# Patient Record
Sex: Female | Born: 1955 | Race: Black or African American | Hispanic: No | Smoking: Former smoker
Health system: Southern US, Community
[De-identification: ages and names within clinical notes are randomized; demographics above are authoritative.]

## PROBLEM LIST (undated history)

## (undated) DIAGNOSIS — R569 Unspecified convulsions: Secondary | ICD-10-CM

## (undated) DIAGNOSIS — R7989 Other specified abnormal findings of blood chemistry: Secondary | ICD-10-CM

## (undated) DIAGNOSIS — E119 Type 2 diabetes mellitus without complications: Secondary | ICD-10-CM

## (undated) DIAGNOSIS — I480 Paroxysmal atrial fibrillation: Secondary | ICD-10-CM

## (undated) DIAGNOSIS — I509 Heart failure, unspecified: Secondary | ICD-10-CM

## (undated) DIAGNOSIS — E039 Hypothyroidism, unspecified: Secondary | ICD-10-CM

## (undated) DIAGNOSIS — I10 Essential (primary) hypertension: Secondary | ICD-10-CM

## (undated) HISTORY — PX: PACEMAKER IMPLANT: EP1218

---

## 2020-02-03 ENCOUNTER — Encounter (HOSPITAL_COMMUNITY): Payer: Self-pay

## 2020-02-03 ENCOUNTER — Inpatient Hospital Stay (HOSPITAL_COMMUNITY)
Admission: EM | Admit: 2020-02-03 | Discharge: 2020-02-07 | DRG: 101 | Disposition: A | Discharge status: Home Health | Payer: Medicare Other | Attending: Internal Medicine | Admitting: Internal Medicine

## 2020-02-03 ENCOUNTER — Emergency Department (HOSPITAL_COMMUNITY): Payer: Medicare Other

## 2020-02-03 DIAGNOSIS — Z8249 Family history of ischemic heart disease and other diseases of the circulatory system: Secondary | ICD-10-CM

## 2020-02-03 DIAGNOSIS — I5022 Chronic systolic (congestive) heart failure: Secondary | ICD-10-CM | POA: Diagnosis present

## 2020-02-03 DIAGNOSIS — I679 Cerebrovascular disease, unspecified: Secondary | ICD-10-CM | POA: Diagnosis present

## 2020-02-03 DIAGNOSIS — Z83438 Family history of other disorder of lipoprotein metabolism and other lipidemia: Secondary | ICD-10-CM

## 2020-02-03 DIAGNOSIS — E785 Hyperlipidemia, unspecified: Secondary | ICD-10-CM | POA: Diagnosis present

## 2020-02-03 DIAGNOSIS — I10 Essential (primary) hypertension: Secondary | ICD-10-CM | POA: Diagnosis present

## 2020-02-03 DIAGNOSIS — Z794 Long term (current) use of insulin: Secondary | ICD-10-CM | POA: Diagnosis not present

## 2020-02-03 DIAGNOSIS — I252 Old myocardial infarction: Secondary | ICD-10-CM

## 2020-02-03 DIAGNOSIS — I480 Paroxysmal atrial fibrillation: Secondary | ICD-10-CM | POA: Diagnosis present

## 2020-02-03 DIAGNOSIS — Z7901 Long term (current) use of anticoagulants: Secondary | ICD-10-CM

## 2020-02-03 DIAGNOSIS — I493 Ventricular premature depolarization: Secondary | ICD-10-CM | POA: Diagnosis present

## 2020-02-03 DIAGNOSIS — Z20822 Contact with and (suspected) exposure to covid-19: Secondary | ICD-10-CM | POA: Diagnosis present

## 2020-02-03 DIAGNOSIS — E039 Hypothyroidism, unspecified: Secondary | ICD-10-CM | POA: Diagnosis not present

## 2020-02-03 DIAGNOSIS — E119 Type 2 diabetes mellitus without complications: Secondary | ICD-10-CM | POA: Diagnosis not present

## 2020-02-03 DIAGNOSIS — Z95810 Presence of automatic (implantable) cardiac defibrillator: Secondary | ICD-10-CM | POA: Diagnosis not present

## 2020-02-03 DIAGNOSIS — Z823 Family history of stroke: Secondary | ICD-10-CM

## 2020-02-03 DIAGNOSIS — E038 Other specified hypothyroidism: Secondary | ICD-10-CM | POA: Diagnosis not present

## 2020-02-03 DIAGNOSIS — I4891 Unspecified atrial fibrillation: Secondary | ICD-10-CM | POA: Diagnosis present

## 2020-02-03 DIAGNOSIS — Z7984 Long term (current) use of oral hypoglycemic drugs: Secondary | ICD-10-CM

## 2020-02-03 DIAGNOSIS — Z8673 Personal history of transient ischemic attack (TIA), and cerebral infarction without residual deficits: Secondary | ICD-10-CM | POA: Diagnosis not present

## 2020-02-03 DIAGNOSIS — I110 Hypertensive heart disease with heart failure: Secondary | ICD-10-CM | POA: Diagnosis present

## 2020-02-03 DIAGNOSIS — J45909 Unspecified asthma, uncomplicated: Secondary | ICD-10-CM | POA: Diagnosis present

## 2020-02-03 DIAGNOSIS — Z87891 Personal history of nicotine dependence: Secondary | ICD-10-CM

## 2020-02-03 DIAGNOSIS — I2510 Atherosclerotic heart disease of native coronary artery without angina pectoris: Secondary | ICD-10-CM | POA: Diagnosis present

## 2020-02-03 DIAGNOSIS — K219 Gastro-esophageal reflux disease without esophagitis: Secondary | ICD-10-CM | POA: Diagnosis present

## 2020-02-03 DIAGNOSIS — Z833 Family history of diabetes mellitus: Secondary | ICD-10-CM | POA: Diagnosis not present

## 2020-02-03 DIAGNOSIS — R569 Unspecified convulsions: Secondary | ICD-10-CM | POA: Diagnosis present

## 2020-02-03 DIAGNOSIS — R791 Abnormal coagulation profile: Secondary | ICD-10-CM | POA: Diagnosis present

## 2020-02-03 DIAGNOSIS — Z955 Presence of coronary angioplasty implant and graft: Secondary | ICD-10-CM | POA: Diagnosis not present

## 2020-02-03 DIAGNOSIS — Z9114 Patient's other noncompliance with medication regimen: Secondary | ICD-10-CM

## 2020-02-03 DIAGNOSIS — I5021 Acute systolic (congestive) heart failure: Secondary | ICD-10-CM | POA: Diagnosis not present

## 2020-02-03 DIAGNOSIS — E1165 Type 2 diabetes mellitus with hyperglycemia: Secondary | ICD-10-CM | POA: Diagnosis present

## 2020-02-03 LAB — URINALYSIS, ROUTINE W REFLEX MICROSCOPIC
Bilirubin Urine: NEGATIVE
Glucose, UA: NEGATIVE mg/dL
Hgb urine dipstick: NEGATIVE
Ketones, ur: NEGATIVE mg/dL
Leukocytes,Ua: NEGATIVE
Nitrite: NEGATIVE
Protein, ur: 30 mg/dL — AB
Specific Gravity, Urine: 1.018 (ref 1.005–1.030)
pH: 5 (ref 5.0–8.0)

## 2020-02-03 LAB — MAGNESIUM: Magnesium: 1.7 mg/dL (ref 1.7–2.4)

## 2020-02-03 LAB — PROTIME-INR
INR: 1.2 (ref 0.8–1.2)
Prothrombin Time: 15 seconds (ref 11.4–15.2)

## 2020-02-03 LAB — TROPONIN I (HIGH SENSITIVITY)
Troponin I (High Sensitivity): 4 ng/L (ref <18)
Troponin I (High Sensitivity): 4 ng/L (ref <18)

## 2020-02-03 LAB — BASIC METABOLIC PANEL
Anion gap: 11 (ref 5–15)
BUN: 24 mg/dL — ABNORMAL HIGH (ref 8–23)
CO2: 22 mmol/L (ref 22–32)
Calcium: 10 mg/dL (ref 8.9–10.3)
Chloride: 108 mmol/L (ref 98–111)
Creatinine, Ser: 0.99 mg/dL (ref 0.44–1.00)
GFR calc Af Amer: >60 mL/min (ref >60)
GFR calc non Af Amer: >60 mL/min (ref >60)
Glucose, Bld: 178 mg/dL — ABNORMAL HIGH (ref 70–99)
Potassium: 3.9 mmol/L (ref 3.5–5.1)
Sodium: 141 mmol/L (ref 135–145)

## 2020-02-03 LAB — CBG MONITORING, ED: Glucose-Capillary: 118 mg/dL — ABNORMAL HIGH (ref 70–99)

## 2020-02-03 LAB — CBC
HCT: 42.4 % (ref 36.0–46.0)
Hemoglobin: 13.1 g/dL (ref 12.0–15.0)
MCH: 28.9 pg (ref 26.0–34.0)
MCHC: 30.9 g/dL (ref 30.0–36.0)
MCV: 93.6 fL (ref 80.0–100.0)
Platelets: 299 10*3/uL (ref 150–400)
RBC: 4.53 MIL/uL (ref 3.87–5.11)
RDW: 13.6 % (ref 11.5–15.5)
WBC: 15.7 10*3/uL — ABNORMAL HIGH (ref 4.0–10.5)
nRBC: 0 % (ref 0.0–0.2)

## 2020-02-03 LAB — SARS CORONAVIRUS 2 BY RT PCR (HOSPITAL ORDER, PERFORMED IN ~~LOC~~ HOSPITAL LAB): SARS Coronavirus 2: NEGATIVE

## 2020-02-03 LAB — LACTIC ACID, PLASMA: Lactic Acid, Venous: 2.7 mmol/L (ref 0.5–1.9)

## 2020-02-03 LAB — TSH: TSH: 12.202 u[IU]/mL — ABNORMAL HIGH (ref 0.350–4.500)

## 2020-02-03 MED ORDER — GLIMEPIRIDE 4 MG PO TABS: 4.0000 mg | ORAL_TABLET | Freq: Every morning | ORAL | Status: DC

## 2020-02-03 MED ORDER — ACETAMINOPHEN 650 MG RE SUPP: 650.0000 mg | Freq: Four times a day (QID) | RECTAL | Status: DC | PRN

## 2020-02-03 MED ORDER — LEVOTHYROXINE SODIUM 25 MCG PO TABS
125.0000 ug | ORAL_TABLET | Freq: Every day | ORAL | Status: DC
  Administered 2020-02-04: 125 ug via ORAL
  Filled 2020-02-03: qty 1

## 2020-02-03 MED ORDER — MAGNESIUM HYDROXIDE 400 MG/5ML PO SUSP
30.0000 mL | Freq: Every day | ORAL | Status: DC | PRN
  Filled 2020-02-03: qty 30

## 2020-02-03 MED ORDER — LEVETIRACETAM IN NACL 1000 MG/100ML IV SOLN
1000.0000 mg | Freq: Once | INTRAVENOUS | Status: AC
  Administered 2020-02-03: 1000 mg via INTRAVENOUS
  Filled 2020-02-03: qty 100

## 2020-02-03 MED ORDER — TRAZODONE HCL 50 MG PO TABS
25.0000 mg | ORAL_TABLET | Freq: Every evening | ORAL | Status: DC | PRN
  Administered 2020-02-05: 25 mg via ORAL
  Filled 2020-02-03 (×2): qty 1

## 2020-02-03 MED ORDER — SODIUM CHLORIDE 0.9 % IV SOLN: INTRAVENOUS | Status: DC

## 2020-02-03 MED ORDER — WARFARIN SODIUM 7.5 MG PO TABS
7.5000 mg | ORAL_TABLET | Freq: Once | ORAL | Status: AC
  Administered 2020-02-03: 7.5 mg via ORAL
  Filled 2020-02-03: qty 1

## 2020-02-03 MED ORDER — ACETAMINOPHEN 325 MG PO TABS
650.0000 mg | ORAL_TABLET | Freq: Four times a day (QID) | ORAL | Status: DC | PRN
  Administered 2020-02-04: 650 mg via ORAL
  Filled 2020-02-03: qty 2

## 2020-02-03 MED ORDER — HYDRALAZINE HCL 50 MG PO TABS
100.0000 mg | ORAL_TABLET | Freq: Three times a day (TID) | ORAL | Status: DC
  Administered 2020-02-03 – 2020-02-07 (×10): 100 mg via ORAL
  Filled 2020-02-03 (×11): qty 2

## 2020-02-03 MED ORDER — SODIUM CHLORIDE 0.9 % IV BOLUS
1000.0000 mL | Freq: Once | INTRAVENOUS | Status: AC
  Administered 2020-02-03: 1000 mL via INTRAVENOUS

## 2020-02-03 MED ORDER — WARFARIN SODIUM 6 MG PO TABS: 6.0000 mg | ORAL_TABLET | Freq: Every evening | ORAL | Status: DC

## 2020-02-03 MED ORDER — INSULIN GLARGINE 100 UNIT/ML SOLOSTAR PEN: 30.0000 [IU] | PEN_INJECTOR | Freq: Two times a day (BID) | SUBCUTANEOUS | Status: DC

## 2020-02-03 MED ORDER — LORAZEPAM 2 MG/ML IJ SOLN: 1.0000 mg | INTRAMUSCULAR | Status: DC | PRN

## 2020-02-03 MED ORDER — FUROSEMIDE 20 MG PO TABS: 40.0000 mg | ORAL_TABLET | Freq: Every day | ORAL | Status: DC

## 2020-02-03 MED ORDER — MIRABEGRON ER 25 MG PO TB24
25.0000 mg | ORAL_TABLET | Freq: Every day | ORAL | Status: DC
  Administered 2020-02-04 – 2020-02-07 (×4): 25 mg via ORAL
  Filled 2020-02-03 (×4): qty 1

## 2020-02-03 MED ORDER — VITAMIN D (ERGOCALCIFEROL) 1.25 MG (50000 UNIT) PO CAPS: 50000.0000 [IU] | ORAL_CAPSULE | ORAL | Status: DC

## 2020-02-03 MED ORDER — RAMIPRIL 5 MG PO CAPS
5.0000 mg | ORAL_CAPSULE | Freq: Every morning | ORAL | Status: DC
  Administered 2020-02-04 – 2020-02-07 (×4): 5 mg via ORAL
  Filled 2020-02-03 (×5): qty 1

## 2020-02-03 MED ORDER — WARFARIN - PHARMACIST DOSING INPATIENT: Freq: Every day | Status: DC

## 2020-02-03 MED ORDER — INSULIN ASPART 100 UNIT/ML ~~LOC~~ SOLN
0.0000 [IU] | Freq: Three times a day (TID) | SUBCUTANEOUS | Status: DC
  Administered 2020-02-04 (×2): 1 [IU] via SUBCUTANEOUS
  Administered 2020-02-04: 2 [IU] via SUBCUTANEOUS
  Administered 2020-02-04: 3 [IU] via SUBCUTANEOUS
  Administered 2020-02-05: 1 [IU] via SUBCUTANEOUS
  Administered 2020-02-05: 2 [IU] via SUBCUTANEOUS

## 2020-02-03 MED ORDER — ONDANSETRON HCL 4 MG/2ML IJ SOLN: 4.0000 mg | Freq: Four times a day (QID) | INTRAMUSCULAR | Status: DC | PRN

## 2020-02-03 MED ORDER — LEVETIRACETAM ER 500 MG PO TB24
500.0000 mg | ORAL_TABLET | Freq: Two times a day (BID) | ORAL | Status: DC
  Administered 2020-02-04 – 2020-02-07 (×7): 500 mg via ORAL
  Filled 2020-02-03 (×8): qty 1

## 2020-02-03 MED ORDER — ENOXAPARIN SODIUM 40 MG/0.4ML ~~LOC~~ SOLN
40.0000 mg | SUBCUTANEOUS | Status: DC
  Administered 2020-02-03 – 2020-02-06 (×4): 40 mg via SUBCUTANEOUS
  Filled 2020-02-03 (×4): qty 0.4

## 2020-02-03 MED ORDER — ONDANSETRON HCL 4 MG PO TABS: 4.0000 mg | ORAL_TABLET | Freq: Four times a day (QID) | ORAL | Status: DC | PRN

## 2020-02-03 MED ORDER — VITAMIN D3 1.25 MG (50000 UT) PO CAPS: 50000.0000 [IU] | ORAL_CAPSULE | ORAL | Status: DC

## 2020-02-03 NOTE — Progress Notes (Signed)
ANTICOAGULATION CONSULT NOTE - Initial Consult  Pharmacy Consult for warfarin Indication: atrial fibrillation  No Known Allergies  Patient Measurements: Height: 5' (152.4 cm) Weight: 72.6 kg (160 lb) IBW/kg (Calculated) : 45.5   Vital Signs: Temp: 98.3 F (36.8 C) (07/24 1350) Temp Source: Oral (07/24 1350) BP: 157/89 (07/24 2245) Pulse Rate: 59 (07/24 2245)  Labs: Recent Labs    02/03/20 1400 02/03/20 1403 02/03/20 2014  HGB  --  13.1  --   HCT  --  42.4  --   PLT  --  299  --   LABPROT  --   --  15.0  INR  --   --  1.2  CREATININE 0.99  --   --   TROPONINIHS  --   --  4    Estimated Creatinine Clearance: 51 mL/min (by C-G formula based on SCr of 0.99 mg/dL).   Assessment: 64 yo W on warfarin PTA for afib (CHADS2VASc = 8) with recent loss of family member and noncompliance with medications. INR 1.2 on admission but patient reported taking home dose of 6mg  on 7/23. Previous INR in June was high at 3.4.  PTA Warfarin dose = appears to be 6mg  daily but needs confirmation with patient's daughter   Goal of Therapy:  INR 2-3 Monitor platelets by anticoagulation protocol: Yes   Plan:  Warfarin 7.5mg  x1  Monitor daily INR, CBC/plt Monitor for signs/symptoms of bleeding   July, PharmD, BCPS, BCCP Clinical Pharmacist  Please check AMION for all [facility] phone numbers After 10:00 PM, call Main Pharmacy [phone]

## 2020-02-03 NOTE — ED Notes (Signed)
2 RN attempted IV start without success.

## 2020-02-03 NOTE — H&P (Signed)
[place] at [facility]   PATIENT NAME: Nichole [name]    MR#:  [id number]  DATE OF BIRTH:  May 05, 1956  DATE OF ADMISSION:  02/03/2020  PRIMARY CARE PHYSICIAN: Patient, No Pcp Per   REQUESTING/REFERRING PHYSICIAN: Jacalyn Lefevre, MD  CHIEF COMPLAINT:   Chief Complaint  Patient presents with  . Seizures    HISTORY OF PRESENT ILLNESS:  Nichole [name]  is a 64 y.o. African-American female with a known history of asthma, atrial fibrillation chronic systolic CHF, coronary artery disease, hypertension, dyslipidemia and CVA, presented to the emergency room with the onset of suspected seizure.  The patient was noted by her daughter to have shaking, Eglitis with unresponsiveness.  When she recovered however she was alert and oriented.  The patient denied any urinary or stool incontinence or tongue bites.  The patient was recently admitted to [facility] for sepsis and UTI was placed on mechanical intubation.  She was discharged to skilled nursing facility and then went home about 3 weeks ago.  Her husband died on 01/16/23.  She apparently has been missing her medications till 713 when she was staying alone and after that she moved with her daughter.  Her daughter stated that she has been taking her medications regularly with her.  No reported fever or chills.  No nausea or vomiting or abdominal pain.  No chest pain or palpitations.  No dysuria, oliguria or hematuria or flank pain.  She denies any new paresthesias or focal muscle weakness beyond her residual weakness from previous stroke.  Upon presentation to the emergency room, vital signs were within normal.  Labs revealed BMP with glucose of 178 and BUN of 24.  Magnesium level is 1.7.  High-sensitivity troponin I was 4 lactic acid 2.7.  CBC showed leukocytosis of 15.7.  TSH came back elevated at 12.2.  COVID-19 PCR came back negative.  Scan showed no acute intracranial normalities.  It also showed advanced atrophy and  microvascular ischemic change as well as multiple lacunar infarcts involving both basal ganglia, presumably chronic with old infarct involving the posterior inferior right cerebellum.  Two-view chest ray showed mild cardiomegaly with no acute cardiopulmonary disease.  The patient was given IV Keppra and 2 L bolus of IV normal saline.  She will be admitted to a telemetry medical bed for further evaluation and management. PAST MEDICAL HISTORY:  . Asthma  . Atrial fibrillation (HCC) 03/24/2012  . Chronic systolic heart failure (HCC) 03/24/2012  . Heart attack (HCC)  . Hyperlipidemia  . Hypertension  . Nonsustained ventricular tachycardia (HCC) 03/24/2012  . Stroke (HCC)  . Thyroid disease   PAST SURGICAL HISTORY:  . CARDIAC CATHETERIZATION  . CARDIAC DEVICE IMPLANT  defib  . CHOLECYSTECTOMY  . CORONARY ANGIOPLASTY WITH STENT PLACEMENT  . GALLBLADDER SURGERY .  Marland Kitchen HYSTERECTOMY   SOCIAL HISTORY:  Socioeconomic History  . Marital status: Widowed Spouse name: Onalee Hua  . Number of children: 2  . Years of education: Not on file  . Highest education level: Not on file  Occupational History  . Occupation: Chiropodist at [employer]  Comment: Disabled  Tobacco Use  . Smoking status: Former Smoker  Types: Cigarettes  Quit date: 01/17/2009  Years since quitting: 10.8  . Smokeless tobacco: Never Used  Vaping Use  . Vaping Use: Never assessed  Substance and Sexual Activity  . Alcohol use: No  . Drug use: No    FAMILY HISTORY:  . Diabetes Mother  .  Hypertension Mother  . Stroke Mother  . Stroke Father  . Hypertension Father  . Heart attack Sister  . Heart disease Sister  . Hypertension Sister  . Diabetes Brother  . Heart disease Brother  . Hyperlipidemia Brother  . Hypertension Brother  . Hypertension Sister  . Hypertension Sister  . Hypertension Sister    DRUG ALLERGIES:  No Known Allergies  REVIEW OF SYSTEMS:   ROS As per history of present illness. All pertinent  systems were reviewed above. Constitutional, HEENT, cardiovascular, respiratory, GI, GU, musculoskeletal, neuro, psychiatric, endocrine, integumentary and hematologic systems were reviewed and are otherwise negative/unremarkable except for positive findings mentioned above in the HPI.   MEDICATIONS AT HOME:   Prior to Admission medications   Medication Sig Start Date End Date Taking? Authorizing Provider  Cholecalciferol (VITAMIN D3) 1.25 MG (50000 UT) CAPS Take 50,000 Units by mouth every Friday. 01/10/20  Yes [provider]  hydrALAZINE (APRESOLINE) 100 MG tablet Take 100 mg by mouth 3 (three) times daily. 01/12/20  Yes [provider]  levothyroxine (SYNTHROID) 125 MCG tablet Take 125 mcg by mouth every morning. 09/06/19  Yes [provider]  metFORMIN (GLUCOPHAGE) 1000 MG tablet Take 1,000 mg by mouth 2 (two) times daily. 01/10/20  Yes [provider]  mirabegron ER (MYRBETRIQ) 25 MG TB24 tablet Take 25 mg by mouth daily.   Yes [provider]  warfarin (COUMADIN) 6 MG tablet Take 6 mg by mouth every evening. 01/10/20  Yes [provider]  furosemide (LASIX) 40 MG tablet Take 40 mg by mouth daily. 01/10/20   [provider]  glimepiride (AMARYL) 4 MG tablet Take 4 mg by mouth every morning. 01/10/20   [provider]  insulin glargine (LANTUS SOLOSTAR) 100 UNIT/ML Solostar Pen Inject 30 Units into the skin 2 (two) times daily.    [provider]  ramipril (ALTACE) 5 MG capsule Take 5 mg by mouth every morning. 01/10/20   [provider]      VITAL SIGNS:  Blood pressure (!) 146/89, pulse 63, temperature 98.3 F (36.8 C), temperature source Oral, resp. rate 23, height 5' (1.524 m), weight 72.6 kg, SpO2 95 %.  PHYSICAL EXAMINATION:  Physical Exam  GENERAL:  64 y.o.-year-old African-American female patient lying in the bed with no acute distress.  EYES: Pupils equal, round, reactive to light and  accommodation. No scleral icterus. Extraocular muscles intact.  HEENT: Head atraumatic, normocephalic. Oropharynx and nasopharynx clear.  NECK:  Supple, no jugular venous distention. No thyroid enlargement, no tenderness.  LUNGS: Normal breath sounds bilaterally, no wheezing, rales,rhonchi or crepitation. No use of accessory muscles of respiration.  CARDIOVASCULAR: Regular rate and rhythm, S1, S2 normal. No murmurs, rubs, or gallops.  ABDOMEN: Soft, nondistended, nontender. Bowel sounds present. No organomegaly or mass.  EXTREMITIES: No pedal edema, cyanosis, or clubbing.  NEUROLOGIC: Cranial nerves II through XII are intact. Muscle strength 4-5/5 in left upper and lower extremities compared to 5/5 in the right upper and lower extremities.  Sensation intact. Gait not checked.  PSYCHIATRIC: The patient is alert and oriented x 3.  Normal affect and good eye contact. SKIN: No obvious rash, lesion, or ulcer.   LABORATORY PANEL:   CBC Recent Labs  Lab 02/03/20 1403  WBC 15.7*  HGB 13.1  HCT 42.4  PLT 299   ------------------------------------------------------------------------------------------------------------------  Chemistries  Recent Labs  Lab 02/03/20 1400 02/03/20 2014  NA 141  --   K 3.9  --   CL 108  --  CO2 22  --   GLUCOSE 178*  --   BUN 24*  --   CREATININE 0.99  --   CALCIUM 10.0  --   MG  --  1.7   ------------------------------------------------------------------------------------------------------------------  Cardiac Enzymes No results for input(s): TROPONINI in the last 168 hours. ------------------------------------------------------------------------------------------------------------------  RADIOLOGY:  DG Chest 2 View  Result Date: 02/03/2020 CLINICAL DATA:  Seizures EXAM: CHEST - 2 VIEW COMPARISON:  None. FINDINGS: The lungs are clear. Opacification of the left lung bases related to overlying soft tissue. No pneumothorax or pleural effusion. Cardiac  size is mildly enlarged. Left subclavian 3 lead pacemaker defibrillator is in place. Pulmonary vascularity is normal. No acute bone abnormality. IMPRESSION: No active cardiopulmonary disease.  Mild cardiomegaly. Electronically Signed   By: Helyn NumbersAshesh  Parikh MD   On: 02/03/2020 19:43   CT HEAD WO CONTRAST  Result Date: 02/03/2020 CLINICAL DATA:  Seizure. EXAM: CT HEAD WITHOUT CONTRAST TECHNIQUE: Contiguous axial images were obtained from the base of the skull through the vertex without intravenous contrast. COMPARISON:  None. FINDINGS: Brain: No evidence of acute infarction, hemorrhage, hydrocephalus, extra-axial collection or mass lesion/mass effect. Advanced atrophy and microvascular ischemic changes are noted. Multiple lacunar infarcts are noted involving the bilateral basal 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 posteroinferior right cerebellum (sagittal series 6, image 25). Vascular: No hyperdense vessel or unexpected calcification. Skull: Normal. Negative for fracture or focal lesion. Sinuses/Orbits: No acute finding. Other: None. IMPRESSION: 1. No acute intracranial abnormality. 2. Advanced atrophy and microvascular ischemic changes. 3. Multiple lacunar infarcts are noted involving the bilateral basal 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 posteroinferior right cerebellum. Electronically Signed   By: Katherine Mantlehristopher  Green M.D.   On: 02/03/2020 19:37      IMPRESSION AND PLAN:   1.  Seizures of new onset. -The patient will be admitted to a telemetry medical bed. -We will follow neuro checks every 4 hours for 24 hours. -She will be on seizures precautions. -We will place on as needed IV Ativan. -She was given IV Keppra and will be continued on it. -Neurology consultation will be obtained. -Dr. Amada JupiterKirkpatrick was notified about patient.  2.  Type 2 diabetes mellitus. -We will place her for now on supplement coverage with NovoLog. -We will hold  off Glucophage and Amaryl as well as Lantus and monitor her blood glucose while she is here while checking hemoglobin A1c.  3.  Hypothyroidism with elevated TSH. -This could be secondary to noncompliance with medications due to her husband's recent death. -We will check free T3 and free T4 and continue Synthroid.  4.  Hypertension. -Continue Coreg and Altace.  5.  Dyslipidemia. -Continue statin therapy.  6.  Paroxysmal atrial fibrillation on chronic anticoagulation with Coumadin. -We will continue Coreg and per pharmacy.  7.  GERD. -We will continue PPI therapy.  8.  DVT prophylaxis. -Subcutaneous Lovenox   All the records are reviewed and case discussed with ED provider. The plan of care was discussed in details with the patient (and family). I answered all questions. The patient agreed to proceed with the above mentioned plan. Further management will depend upon hospital course.   CODE STATUS: Full code  Status is: Inpatient  Remains inpatient appropriate because:Ongoing diagnostic testing needed not appropriate for outpatient work up, Unsafe d/c plan, IV treatments appropriate due to intensity of illness or inability to take PO and Inpatient level of care appropriate due to severity of illness The patient will  be further assessed for her seizures and monitored.  She has not been taking medications for several days and her TSH is elevated.  Her blood glucose measures will be monitored while she is here.  She is subtherapeutic on her INR as she is likely not been taking Coumadin which will be resumed and will follow her INR here.  Given all this problems she would likely need more than 2 midnights of hospital stay   Dispo: The patient is from: Home              Anticipated d/c is to: Home              Anticipated d/c date is: 2 days              Patient currently is not medically stable to d/c.   TOTAL TIME TAKING CARE OF THIS PATIENT: 55 minutes.    Hannah Beat M.D on  02/03/2020 at 10:33 PM  [facility]   From 7 PM-7 AM, contact night-coverage [email or web address]  CC: Primary care physician; Patient, No Pcp Per   Note: This dictation was prepared with Dragon dictation along with smaller phrase technology. Any transcriptional typo errors that result from this process are unintentional.

## 2020-02-03 NOTE — ED Notes (Signed)
RN attempted IV start with blood draw twice, unsuccessfully. Second RN to attempt IV start, RN requested lab to draw blood work.

## 2020-02-03 NOTE — ED Notes (Signed)
Dr. Particia Nearing notified of critical lactic acid result.

## 2020-02-03 NOTE — ED Notes (Signed)
Pt in CT.

## 2020-02-03 NOTE — ED Provider Notes (Signed)
[facility] EMERGENCY DEPARTMENT Provider Note   CSN: [id number] Arrival date & time: 02/03/20  1347     History Chief Complaint  Patient presents with  . Seizures    Nichole [name] is a 64 y.o. female.  Pt presents to the ED today with a possible seizure.  Pt was sitting down and eating and had what looked to be a seizure.  Pt's daughter witnessed this.  Pt was incontinent of urine.  The daughter checked her bs and it was nl.  Pt has never had a seizure before.  Pt has just moved to [place] about a week ago.  Her husband was her primary caregiver.  He passed away about 2 weeks ago.  Pt's daughter said she's been giving pt all the meds she was supposed to be taking.  Pt's daughter said she seemed to be doing ok until today.  Pt had an ICU admission from 5/23-29 at [facility]  She was transferred there from [facility].  She had septic shock due to UTI and ARF.  She required intubation and 1 dialysis session.  She grew out e.coli in her urine and was treated with rocephin.  Pt has afib and is on coumadin.  She has a Water quality scientist.  She was sent to a snf after d/c and then home after that.  Pt has not been vaccinated against Covid.  Most recent labs:  Pertinent Diagnostic Studies: CBC Recent Labs  Lab 12/09/19 0542 12/08/19 0812 12/07/19 0509 12/06/19 0457 12/05/19 0355 12/04/19 0444 12/03/19 1330 12/03/19 1132  WBC 12.1* 10.8* 10.4* -- 14.3* 13.7* 14.2* 16.2*  HGB 10.6* 11.2 10.9* -- 12.3 11.2 12.6 11.8  HCT 34.1 34.8 34.1 -- 36.6 32.2* 38.3 37.8  PLT 204 193 168 159* 214 240 241 246  MCV 93.4 90.2 91.2 -- 85.7 83.4 89.1 93.3   BMP Recent Labs  Lab 12/09/19 0542 12/08/19 1953 12/08/19 0812 12/07/19 1730 12/07/19 0509 12/06/19 2023 12/06/19 0818 12/06/19 0515 12/06/19 0458 12/05/19 1733 12/03/19 1015  NA 137 139 -- 139 139 137 -- -- 141 144 132*  K 3.1* 3.5 -- 3.3* 3.5 3.7 -- -- 4.0 3.2* 5.9*  CL 103 104 -- 104 104 102  -- -- 106 105 104  CO2 25.2 26.5 -- 27.3 25.5 24.7 -- -- 23.8 30.3 --  BUN 14 15 -- _0 -- -- 20 24* --  CREATININE 1.16* 1.32* -- 1.56* 1.75* 1.86* -- -- 2.46* 3.20* 12.24*  GLUC 197* 186* -- 217* 282* 266* -- -- 149* 123* --  AGAP 9 9 -- _1 -- -- 11 9 --  GFR 57 49 -- 40 35 33 -- -- 24 18 <15  CALCIUM 9.1 9.1 -- 9.2 8.9 9.3 -- -- 8.4* 8.6* --  CALCIUMIONIZ -- -- -- -- -- -- 1.15 1.06* -- 1.02* 0.95*  MG -- 1.5* 1.6 1.9 2.4 2.9* -- -- 1.3* 1.6 --  PHOS -- 2.9 2.0* 1.9* 2.6 2.6 -- -- 2.6 2.4 --   LFT Recent Labs  Lab 12/04/19 0444 12/03/19 1132  TP 5.8 6.5  ALB 2.7* 3.0*  AST 22 26  ALT <7* 9*  ALK 53 54  TBIL 0.5 0.5   Coagulation  Recent Labs  Lab 12/09/19 0542 12/08/19 0812 12/07/19 1314 12/07/19 0511 12/06/19 0457 12/05/19 2341 12/05/19 1548 12/05/19 0852 12/04/19 2241 12/04/19 1554 12/03/19 1330 12/03/19 1213  PT 10.6 10.5 10.9 -- -- -- -- -- -- -- 16.1* 16.8*  INR 1.02 1.01 1.05 -- -- -- -- -- -- -- 1.59* 1.66*  PTT -- -- -- 53.9* 48.8* 49.8* 39.7* 46.4* 41.5* 48.7* 30.3 32.3   Urine Studies Recent Labs  Lab 12/04/19 1425  COLORU Straw  UBILI Negative  UBLD Moderate*  UCLA Clear  UGLU >=500*  UKET 20 *  ULEU Trace*  UNIT Negative  UPH 8.0  UPRT Negative  UROB <2.0  USPGR 1.003   Endocrine panel Recent Labs  Lab 12/04/19 0444 12/03/19 1132  TSHHIGHSENSI -- 3.871  HA1C >14.0* --   Lipid Panel Recent Labs  Lab 12/05/19 0355  CHOL 137  TRIG 121  HDL 53.0  LDL 59.8   ABG Recent Labs  Lab 12/05/19 1048 12/05/19 0615 12/05/19 0402 12/04/19 1534 12/04/19 0814 12/04/19 0609 12/04/19 0356  PHART 7.45 7.53* 7.48* 7.53* 7.53* 7.67* 7.54*  PO2ART 97 101 102 94 70* 83 93  PCO2ART 37.1 36.2 39.8 32.4* 25.3* 14.5* 19.8*  HCO3ART 26.0 30.0* 29.5* 26.9 21.1 16.6* 16.9*   12/09/2019 14:13 Ref. Range 12/09/2019 13:14  SARS CoV-2 Antigen Latest Ref Range: Negative Negative   Lab Results  Component Value Date  BLDCX No Growth  After 5 Days. 12/03/2019  BLDCX No Growth After 5 Days. 12/03/2019  URINECX No Growth at 18-24 hrs. 12/03/2019   Urine cx 12/05/19 at [facility] >100,000cfu/mL E.coli >> resistant to ampicillin, ciprofloxacin >> intermediate to Levaquin >> sensitive to ceftriaxone, bactrim         History reviewed. No pertinent past medical history.  Past Medical History:  Diagnosis Date  . Acute kidney injury ([place]) 03/24/2012  . Asthma  . Atrial fibrillation ([place]) 03/24/2012  . Chronic systolic heart failure ([place]) 03/24/2012  . Heart attack ([place])  . Hyperlipidemia  . Hypertension  . Nonsustained ventricular tachycardia ([place]) 03/24/2012  . Stroke ([place])  . Thyroid disease     Past Surgical History:  Procedure Laterality Date  . CARDIAC CATHETERIZATION  . CARDIAC DEVICE IMPLANT  defib  . CHOLECYSTECTOMY  . CORONARY ANGIOPLASTY WITH STENT PLACEMENT  . GALLBLADDER SURGERY .  Marland Kitchen HYSTERECTOMY   Patient Active Problem List   Diagnosis Date Noted  . Seizure ([place]) 02/03/2020    History reviewed. No pertinent surgical history.   OB History   No obstetric history on file.     No family history on file.  Family History  Problem Relation Age of Onset  . Diabetes Mother  . Hypertension Mother  . Stroke Mother  . Stroke Father  . Hypertension Father  . Heart attack Sister  . Heart disease Sister  . Hypertension Sister  . Diabetes Brother  . Heart disease Brother  . Hyperlipidemia Brother  . Hypertension Brother  . Hypertension Sister  . Hypertension Sister  . Hypertension Sister    Social History   Tobacco Use  . Smoking status: Not on file  Substance Use Topics  . Alcohol use: Not on file  . Drug use: Not on file  Socioeconomic History  . Marital status: Widowed Spouse name: Shanon Brow  . Number of children: 2  . Years of education: Not on file  . Highest education level: Not on file  Occupational History  . 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 Smoking status: Former Smoker  Types: Cigarettes  Quit date: 01/17/2009  Years since quitting: 10.8  . Smokeless tobacco: Never Used  Vaping Use  . Vaping Use: Never assessed  Substance and Sexual Activity  .  Alcohol use: No  . Drug use: No    Home Medications Prior to Admission medications   Medication Sig Start Date End Date Taking? Authorizing Provider  Cholecalciferol (VITAMIN D3) 1.25 MG (50000 UT) CAPS Take 50,000 Units by mouth every Friday. 01/10/20  Yes [provider]  hydrALAZINE (APRESOLINE) 100 MG tablet Take 100 mg by mouth 3 (three) times daily. 01/12/20  Yes [provider]  levothyroxine (SYNTHROID) 125 MCG tablet Take 125 mcg by mouth every morning. 09/06/19  Yes [provider]  metFORMIN (GLUCOPHAGE) 1000 MG tablet Take 1,000 mg by mouth 2 (two) times daily. 01/10/20  Yes [provider]  mirabegron ER (MYRBETRIQ) 25 MG TB24 tablet Take 25 mg by mouth daily.   Yes [provider]  warfarin (COUMADIN) 6 MG tablet Take 6 mg by mouth every evening. 01/10/20  Yes [provider]  furosemide (LASIX) 40 MG tablet Take 40 mg by mouth daily. 01/10/20   [provider]  glimepiride (AMARYL) 4 MG tablet Take 4 mg by mouth every morning. 01/10/20   [provider]  insulin glargine (LANTUS SOLOSTAR) 100 UNIT/ML Solostar Pen Inject 30 Units into the skin 2 (two) times daily.    [provider]  ramipril (ALTACE) 5 MG capsule Take 5 mg by mouth every morning. 01/10/20   [provider]  Prior to Admission medications  Medication Sig Start Date End Date Taking? Authorizing Provider  carvedilol (COREG) 25 MG tablet Take 25 mg by mouth 2 (two) times daily with meals. Historical Provider, MD  furosemide (LASIX) 40 MG tablet Take 40 mg by mouth daily. Historical Provider, MD  glipiZIDE (GLUCOTROL) 5 MG tablet Take 5 mg by mouth 2 (two) times daily before meals. Historical Provider, MD   hydrALAZINE (APRESOLINE) 100 MG tablet Take 100 mg by mouth 3 (three) times daily. Historical Provider, MD  levothyroxine (SYNTHROID) 125 MCG tablet Take 125 mcg by mouth daily. Historical Provider, MD  metFORMIN (GLUCOPHAGE) 1000 MG tablet Take 1,000 mg by mouth 2 (two) times daily. 04/25/18 Historical Provider, MD  potassium chloride (KLOR-CON) 10 MEQ CR tablet Take 10 mEq by mouth daily. Historical Provider, MD  simvastatin (ZOCOR) 20 MG tablet Take 20 mg by mouth nightly.  Historical Provider, MD  warfarin (COUMADIN) 7.5 MG tablet Take as directed by PCP Patient taking differently: Take 6.5 mg by mouth daily.Take as directed by PCP  10/15/17 Adaline Sill, MD     Allergies    Patient has no known allergies.  NKDA  Review of Systems   Review of Systems  Neurological: Positive for seizures.  All other systems reviewed and are negative.   Physical Exam Updated Vital Signs BP (!) 146/89   Pulse 63   Temp 98.3 F (36.8 C) (Oral)   Resp 23   Ht 5' (1.524 m)   Wt 72.6 kg   SpO2 95%   BMI 31.25 kg/m   Physical Exam Vitals and nursing note reviewed.  Constitutional:      Appearance: Normal appearance.  HENT:     Head: Normocephalic and atraumatic.     Right Ear: External ear normal.     Left Ear: External ear normal.     Nose: Nose normal.     Mouth/Throat:     Mouth: Mucous membranes are moist.     Pharynx: Oropharynx is clear.  Eyes:     Extraocular Movements: Extraocular movements intact.     Conjunctiva/sclera: Conjunctivae normal.     Pupils: Pupils  are equal, round, and reactive to light.  Cardiovascular:     Rate and Rhythm: Normal rate and regular rhythm.     Pulses: Normal pulses.     Heart sounds: Normal heart sounds.  Pulmonary:     Effort: Pulmonary effort is normal.     Breath sounds: Normal breath sounds.  Abdominal:     General: Abdomen is flat. Bowel sounds are normal.     Palpations: Abdomen is soft.  Musculoskeletal:        General: Normal  range of motion.     Cervical back: Normal range of motion and neck supple.  Skin:    General: Skin is warm.     Capillary Refill: Capillary refill takes less than 2 seconds.  Neurological:     General: No focal deficit present.     Mental Status: She is alert.  Psychiatric:        Mood and Affect: Mood normal.        Behavior: Behavior normal.     ED Results / Procedures / Treatments   Labs (all labs ordered are listed, but only abnormal results are displayed) Labs Reviewed  BASIC METABOLIC PANEL - Abnormal; Notable for the following components:      Result Value   Glucose, Bld 178 (*)    BUN 24 (*)    All other components within normal limits  CBC - Abnormal; Notable for the following components:   WBC 15.7 (*)    All other components within normal limits  URINALYSIS, ROUTINE W REFLEX MICROSCOPIC - Abnormal; Notable for the following components:   Protein, ur 30 (*)    Bacteria, UA RARE (*)    All other components within normal limits  TSH - Abnormal; Notable for the following components:   TSH 12.202 (*)    All other components within normal limits  LACTIC ACID, PLASMA - Abnormal; Notable for the following components:   Lactic Acid, Venous 2.7 (*)    All other components within normal limits  CBG MONITORING, ED - Abnormal; Notable for the following components:   Glucose-Capillary 118 (*)    All other components within normal limits  SARS CORONAVIRUS 2 BY RT PCR (HOSPITAL ORDER, [facility])  URINE CULTURE  CULTURE, BLOOD (ROUTINE X 2)  CULTURE, BLOOD (ROUTINE X 2)  PROTIME-INR  MAGNESIUM  LACTIC ACID, PLASMA  HEMOGLOBIN A1C  HIV ANTIBODY (ROUTINE TESTING W REFLEX)  BASIC METABOLIC PANEL  CBC  TROPONIN I (HIGH SENSITIVITY)  TROPONIN I (HIGH SENSITIVITY)    EKG EKG Interpretation  Date/Time:  Saturday February 03 2020 20:29:03 EDT Ventricular Rate:  60 PR Interval:    QRS Duration: 125 QT Interval:  473 QTC Calculation: 473 R  Axis:   -128 Text Interpretation: Sinus rhythm Multiple premature complexes, vent & supraven Right bundle branch block Anterolateral infarct, age indeterminate No old tracing to compare Confirmed by Isla Pence [phone]) on 02/03/2020 9:11:51 PM   Radiology DG Chest 2 View  Result Date: 02/03/2020 CLINICAL DATA:  Seizures EXAM: CHEST - 2 VIEW COMPARISON:  None. FINDINGS: The lungs are clear. Opacification of the left lung bases related to overlying soft tissue. No pneumothorax or pleural effusion. Cardiac size is mildly enlarged. Left subclavian 3 lead pacemaker defibrillator is in place. Pulmonary vascularity is normal. No acute bone abnormality. IMPRESSION: No active cardiopulmonary disease.  Mild cardiomegaly. Electronically Signed   By: Fidela Salisbury MD   On: 02/03/2020 19:43   CT HEAD WO  CONTRAST  Result Date: 02/03/2020 CLINICAL DATA:  Seizure. EXAM: CT HEAD WITHOUT CONTRAST TECHNIQUE: Contiguous axial images were obtained from the base of the skull through the vertex without intravenous contrast. COMPARISON:  None. FINDINGS: Brain: No evidence of acute infarction, hemorrhage, hydrocephalus, extra-axial collection or mass lesion/mass effect. Advanced atrophy and microvascular ischemic changes are noted. Multiple lacunar infarcts are noted involving the bilateral basal 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 posteroinferior right cerebellum (sagittal series 6, image 25). Vascular: No hyperdense vessel or unexpected calcification. Skull: Normal. Negative for fracture or focal lesion. Sinuses/Orbits: No acute finding. Other: None. IMPRESSION: 1. No acute intracranial abnormality. 2. Advanced atrophy and microvascular ischemic changes. 3. Multiple lacunar infarcts are noted involving the bilateral basal 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 posteroinferior right cerebellum. Electronically Signed   By: Constance Holster M.D.   On: 02/03/2020  19:37    Procedures Procedures (including critical care time)  Medications Ordered in ED Medications  sodium chloride 0.9 % bolus 1,000 mL (1,000 mLs Intravenous New Bag/Given 02/03/20 2155)    And  0.9 %  sodium chloride infusion (has no administration in time range)  sodium chloride 0.9 % bolus 1,000 mL (has no administration in time range)  furosemide (LASIX) tablet 40 mg (has no administration in time range)  hydrALAZINE (APRESOLINE) tablet 100 mg (has no administration in time range)  ramipril (ALTACE) capsule 5 mg (has no administration in time range)  glimepiride (AMARYL) tablet 4 mg (has no administration in time range)  insulin glargine (LANTUS) Solostar Pen 30 Units (has no administration in time range)  levothyroxine (SYNTHROID) tablet 125 mcg (has no administration in time range)  mirabegron ER (MYRBETRIQ) tablet 25 mg (has no administration in time range)  warfarin (COUMADIN) tablet 6 mg (has no administration in time range)  Vitamin D3 CAPS 50,000 Units (has no administration in time range)  insulin aspart (novoLOG) injection 0-9 Units (has no administration in time range)  enoxaparin (LOVENOX) injection 40 mg (has no administration in time range)  0.9 %  sodium chloride infusion (has no administration in time range)  acetaminophen (TYLENOL) tablet 650 mg (has no administration in time range)    Or  acetaminophen (TYLENOL) suppository 650 mg (has no administration in time range)  traZODone (DESYREL) tablet 25 mg (has no administration in time range)  magnesium hydroxide (MILK OF MAGNESIA) suspension 30 mL (has no administration in time range)  ondansetron (ZOFRAN) tablet 4 mg (has no administration in time range)    Or  ondansetron (ZOFRAN) injection 4 mg (has no administration in time range)  levETIRAcetam (KEPPRA) IVPB 1000 mg/100 mL premix (0 mg Intravenous Stopped 02/03/20 2231)    ED Course  I have reviewed the triage vital signs and the nursing  notes.  Pertinent labs & imaging results that were available during my care of the patient were reviewed by me and considered in my medical decision making (see chart for details).    MDM Rules/Calculators/A&P                          Pacemaker interrogated and was nl.  Pt's coumadin is nontherapeutic.  TSH is elevated.  Due to her primary caregiver passing away about 2 weeks ago, pt did not take any meds for about a week.  I suspect her medications are incorrect.  Pharmacy is trying to verify if she's taking what she is supposed to take.  Lactic acid is elevated.  Pt given IVFs.  Pt d/w Dr. Leonel Ramsay (neurology) who recommends starting keppra.  He recommended a MRI, but she has a pacemaker.  I don't know if it is MRI compatible.  He will see her in consult.  Pt does not have a pcp here yet.  I think she needs to be tuned up more before we can d/c her.  Pt d/w with Dr. Sidney Ace ([facility]) for admission.  CRITICAL CARE Performed by: Isla Pence   Total critical care time: 30 minutes  Critical care time was exclusive of separately billable procedures and treating other patients.  Critical care was necessary to treat or prevent imminent or life-threatening deterioration.  Critical care was time spent personally by me on the following activities: development of treatment plan with patient and/or surrogate as well as nursing, discussions with consultants, evaluation of patient's response to treatment, examination of patient, obtaining history from patient or surrogate, ordering and performing treatments and interventions, ordering and review of laboratory studies, ordering and review of radiographic studies, pulse oximetry and re-evaluation of patient's condition.      Final Clinical Impression(s) / ED Diagnoses Final diagnoses:  Seizure [facility])    Rx / [place] Orders ED Discharge Orders    None       Isla Pence, MD 02/03/20 2247

## 2020-02-03 NOTE — ED Notes (Signed)
Pacemaker integrated. [employer] reports pacemaker in " perfect working condition". Only abnormality is fluid retention since May.

## 2020-02-03 NOTE — ED Triage Notes (Signed)
Pt arrives to ED via [facility] w/ c/o seizure-like activity. Pt sitting at kitchen table and began convulsing for approx 1-2 mins. Pt denies hx of seizures. Pt noted to be incontinent and postictal on EMS arrival. Pt has cardiac hx, w/ pacemaker.

## 2020-02-04 ENCOUNTER — Inpatient Hospital Stay (HOSPITAL_COMMUNITY): Payer: Medicare Other

## 2020-02-04 DIAGNOSIS — Z8673 Personal history of transient ischemic attack (TIA), and cerebral infarction without residual deficits: Secondary | ICD-10-CM

## 2020-02-04 DIAGNOSIS — E785 Hyperlipidemia, unspecified: Secondary | ICD-10-CM

## 2020-02-04 DIAGNOSIS — I480 Paroxysmal atrial fibrillation: Secondary | ICD-10-CM

## 2020-02-04 DIAGNOSIS — R569 Unspecified convulsions: Principal | ICD-10-CM

## 2020-02-04 DIAGNOSIS — I4891 Unspecified atrial fibrillation: Secondary | ICD-10-CM | POA: Diagnosis present

## 2020-02-04 DIAGNOSIS — I5022 Chronic systolic (congestive) heart failure: Secondary | ICD-10-CM

## 2020-02-04 DIAGNOSIS — I2510 Atherosclerotic heart disease of native coronary artery without angina pectoris: Secondary | ICD-10-CM | POA: Diagnosis present

## 2020-02-04 DIAGNOSIS — E039 Hypothyroidism, unspecified: Secondary | ICD-10-CM | POA: Diagnosis present

## 2020-02-04 DIAGNOSIS — E038 Other specified hypothyroidism: Secondary | ICD-10-CM

## 2020-02-04 DIAGNOSIS — I10 Essential (primary) hypertension: Secondary | ICD-10-CM | POA: Diagnosis present

## 2020-02-04 LAB — PROCALCITONIN: Procalcitonin: <0.1 ng/mL

## 2020-02-04 LAB — BASIC METABOLIC PANEL
Anion gap: 14 (ref 5–15)
BUN: 24 mg/dL — ABNORMAL HIGH (ref 8–23)
CO2: 14 mmol/L — ABNORMAL LOW (ref 22–32)
Calcium: 9 mg/dL (ref 8.9–10.3)
Chloride: 114 mmol/L — ABNORMAL HIGH (ref 98–111)
Creatinine, Ser: 0.96 mg/dL (ref 0.44–1.00)
GFR calc Af Amer: >60 mL/min (ref >60)
GFR calc non Af Amer: >60 mL/min (ref >60)
Glucose, Bld: 76 mg/dL (ref 70–99)
Potassium: 5.6 mmol/L — ABNORMAL HIGH (ref 3.5–5.1)
Sodium: 142 mmol/L (ref 135–145)

## 2020-02-04 LAB — LACTIC ACID, PLASMA
Lactic Acid, Venous: 2.4 mmol/L (ref 0.5–1.9)
Lactic Acid, Venous: 2.1 mmol/L (ref 0.5–1.9)
Lactic Acid, Venous: 2 mmol/L (ref 0.5–1.9)
Lactic Acid, Venous: 1.8 mmol/L (ref 0.5–1.9)

## 2020-02-04 LAB — PROTIME-INR
INR: 1.2 (ref 0.8–1.2)
Prothrombin Time: 15.2 seconds (ref 11.4–15.2)

## 2020-02-04 LAB — GLUCOSE, CAPILLARY
Glucose-Capillary: 137 mg/dL — ABNORMAL HIGH (ref 70–99)
Glucose-Capillary: 132 mg/dL — ABNORMAL HIGH (ref 70–99)
Glucose-Capillary: 159 mg/dL — ABNORMAL HIGH (ref 70–99)
Glucose-Capillary: 227 mg/dL — ABNORMAL HIGH (ref 70–99)

## 2020-02-04 LAB — CBC
HCT: 39.3 % (ref 36.0–46.0)
Hemoglobin: 11.7 g/dL — ABNORMAL LOW (ref 12.0–15.0)
MCH: 28.5 pg (ref 26.0–34.0)
MCHC: 29.8 g/dL — ABNORMAL LOW (ref 30.0–36.0)
MCV: 95.9 fL (ref 80.0–100.0)
Platelets: 261 10*3/uL (ref 150–400)
RBC: 4.1 MIL/uL (ref 3.87–5.11)
RDW: 13.6 % (ref 11.5–15.5)
WBC: 11 10*3/uL — ABNORMAL HIGH (ref 4.0–10.5)
nRBC: 0 % (ref 0.0–0.2)

## 2020-02-04 LAB — T4, FREE: Free T4: 0.95 ng/dL (ref 0.61–1.12)

## 2020-02-04 LAB — POTASSIUM: Potassium: 3.5 mmol/L (ref 3.5–5.1)

## 2020-02-04 LAB — HEMOGLOBIN A1C
Hgb A1c MFr Bld: 12.9 % — ABNORMAL HIGH (ref 4.8–5.6)
Mean Plasma Glucose: 323.53 mg/dL

## 2020-02-04 LAB — HIV ANTIBODY (ROUTINE TESTING W REFLEX): HIV Screen 4th Generation wRfx: NONREACTIVE

## 2020-02-04 MED ORDER — WARFARIN SODIUM 7.5 MG PO TABS
7.5000 mg | ORAL_TABLET | Freq: Once | ORAL | Status: AC
  Administered 2020-02-04: 7.5 mg via ORAL
  Filled 2020-02-04: qty 1

## 2020-02-04 MED ORDER — IBUPROFEN 200 MG PO TABS
600.0000 mg | ORAL_TABLET | Freq: Four times a day (QID) | ORAL | Status: DC | PRN
  Administered 2020-02-05: 600 mg via ORAL
  Filled 2020-02-04: qty 3

## 2020-02-04 MED ORDER — LEVOTHYROXINE SODIUM 75 MCG PO TABS
150.0000 ug | ORAL_TABLET | Freq: Every day | ORAL | Status: DC
  Administered 2020-02-05 – 2020-02-07 (×3): 150 ug via ORAL
  Filled 2020-02-04 (×3): qty 2

## 2020-02-04 MED ORDER — INSULIN GLARGINE 100 UNIT/ML ~~LOC~~ SOLN
5.0000 [IU] | Freq: Every day | SUBCUTANEOUS | Status: DC
  Administered 2020-02-04 – 2020-02-05 (×2): 5 [IU] via SUBCUTANEOUS
  Filled 2020-02-04 (×2): qty 0.05

## 2020-02-04 MED ORDER — CARVEDILOL 3.125 MG PO TABS
3.1250 mg | ORAL_TABLET | Freq: Two times a day (BID) | ORAL | Status: DC
  Administered 2020-02-04 – 2020-02-06 (×6): 3.125 mg via ORAL
  Filled 2020-02-04 (×6): qty 1

## 2020-02-04 NOTE — Progress Notes (Signed)
Patient received from the ED, moved from stretcher to the bed in rm 4NP 04. VSS. Patient awake and alert. CHG bath complete. Will continue to monitor patient.

## 2020-02-04 NOTE — Consult Note (Signed)
Neurology Consultation Reason for Consult: Possible seizure Referring Physician: Para Skeans  CC: Possible seizure  History is obtained from: Patient  HPI: Nichole [name] is a 64 y.o. female with a history of fairly severe cerebrovascular disease presents with a transient episode concerning for seizure.  She was sitting at the table when her daughter heard her "cry out" then reach out her arm and shake some.  She was incontinent of urine, though she is often incontinent of urine because of stress incontinence, so this is less helpful.  She did not have any tongue biting.  The patient is completely amnestic to the episode, from the time when she lost memory to the time when she regains memory was enough time for the ambulance to arrive.  She has never had episodes of loss time or seizure before.  She was recently admitted for several days at [facility] for sepsis.        ROS: A 14 point ROS was performed and is negative except as noted in the HPI.   Past medical history:  Strokes Atrial fibrillation Hyperlipidemia Hypertension Thyroid disease  Family history: Stroke  Social History: Previous smoker  Exam: Current vital signs: BP 126/70   Pulse 65   Temp 98.3 F (36.8 C) (Oral)   Resp 20   Ht 5' (1.524 m)   Wt 72.6 kg   SpO2 92%   BMI 31.25 kg/m  Vital signs in last 24 hours: Temp:  [98.3 F (36.8 C)] 98.3 F (36.8 C) (07/24 1350) Pulse Rate:  [58-83] 65 (07/25 0245) Resp:  [15-28] 20 (07/25 0245) BP: (93-157)/(50-89) 126/70 (07/25 0245) SpO2:  [92 %-100 %] 92 % (07/25 0245) Weight:  [72.6 kg] 72.6 kg (07/24 1351)   Physical Exam  Constitutional: Appears well-developed and well-nourished.  Psych: Affect appropriate to situation Eyes: No scleral injection HENT: No OP obstrucion MSK: no joint deformities.  Cardiovascular: Normal rate and regular rhythm.  Respiratory: Effort normal, non-labored breathing GI: Soft.  No distension. There is no tenderness.   Skin: WDI  Neuro: Mental Status: Patient is awake, alert, oriented to person, place, month, year, and situation. Patient is able to give a clear and coherent history. No signs of aphasia or neglect Cranial Nerves: II: Visual Fields are full. Pupils are equal, round, and reactive to light.   III,IV, VI: EOMI without ptosis or diploplia.  V: Facial sensation is symmetric to temperature VII: Facial movement is symmetric.  VIII: hearing is intact to voice X: Uvula elevates symmetrically XI: Shoulder shrug is symmetric. XII: tongue is midline without atrophy or fasciculations.  Motor: Tone is normal. Bulk is normal. 5/5 strength was present in all four extremities.  Sensory: Sensation is symmetric to light touch and temperature in the arms and legs. Cerebellar: FNF and HKS are intact bilaterally   I have reviewed labs in epic and the results pertinent to this consultation are: Elevated potassium, normal sodium, normal creatinine  I have reviewed the images obtained: CT head-multifocal ischemia, there is some juxtacortical white matter change, but no clear areas of encephalomalacia.  Impression: 64 year old female with a history of heart disease requiring defibrillator as well as multiple previous strokes and atrial fibrillation who presents with what sounds like new onset seizure.  Though there is no clear encephalomalacia on CT, given the degree of cerebrovascular disease, I do suspect that she is at risk for partial seizures and therefore even though this is her first, I would favor starting antiepileptic therapy.  If she is  admitted, then an EEG would be helpful, though even if it is normal I would continue antiepileptic therapy.  Recommendations: 1) EEG 2) Keppra 500 mg twice daily 3) follow-up with outpatient neurology 4) no driving for 6 months    Ritta Slot, MD [facility] [phone]  If 7pm- 7am, please page neurology on call as listed in  [facility].

## 2020-02-04 NOTE — Progress Notes (Signed)
Brief Neuro Update: Briefly, Ms. Nichole [name] is a 22 old female with a history of heart disease with an internal cardiac defibrillator as well as multiple prior strokes and atrial fibrillation who presents with an episode that seem concerning for new onset seizure.  CT head did not show any clear areas of encephalomalacia but did demonstrate significant degree of cerebrovascular disease.  Routine EEG with no epileptiform discharges, no seizures.  Her episode is concerning for an epileptic seizure.  Recommendations: -Keppra 500 twice daily. -Follow-up with outpatient neurology. -No driving for 6 months, seizure precautions including avoiding swimming, do not operate any heavy machinery. -Neurology inpatient team will sign off please feel free to contact us with any questions or concerns.   Erick Blinks [facility] Pager Number [phone]

## 2020-02-04 NOTE — Progress Notes (Signed)
PROGRESS NOTE    Nichole [name]  JAS:[id number] DOB: 1956-05-26 DOA: 02/03/2020 PCP: Patient, No Pcp Per     Brief Narrative:  Nichole [name]  is a 64 y.o. BF PMHX CVA, asthma, chronic atrial fibrillation on Coumadin chronic systolic CHF (pacer),  CAD, HTN,  dyslipidemia   Presented to the emergency room with the onset of suspected seizure.  The patient was noted by her daughter to have shaking, Eglitis with unresponsiveness.  When she recovered however she was alert and oriented.  The patient denied any urinary or stool incontinence or tongue bites.  The patient was recently admitted to [facility] for sepsis and UTI was placed on mechanical intubation.  She was discharged to skilled nursing facility and then went home about 3 weeks ago.  Her husband died on Jan 15, 2023.  She apparently has been missing her medications till 713 when she was staying alone and after that she moved with her daughter.  Her daughter stated that she has been taking her medications regularly with her.  No reported fever or chills.  No nausea or vomiting or abdominal pain.  No chest pain or palpitations.  No dysuria, oliguria or hematuria or flank pain.  She denies any new paresthesias or focal muscle weakness beyond her residual weakness from previous stroke.  Upon presentation to the emergency room, vital signs were within normal.  Labs revealed BMP with glucose of 178 and BUN of 24.  Magnesium level is 1.7.  High-sensitivity troponin I was 4 lactic acid 2.7.  CBC showed leukocytosis of 15.7.  TSH came back elevated at 12.2.  COVID-19 PCR came back negative.  Scan showed no acute intracranial normalities.  It also showed advanced atrophy and microvascular ischemic change as well as multiple lacunar infarcts involving both basal ganglia, presumably chronic with old infarct involving the posterior inferior right cerebellum.  Two-view chest ray showed mild cardiomegaly with no acute cardiopulmonary disease.  The  patient was given IV Keppra and 2 L bolus of IV normal saline.  She will be admitted to a telemetry medical bed for further evaluation and management   Subjective: A/O x4, very flat affect.  States her husband died on 01/15/23 and she moved up here to [place] to be with her daughter.  States is taking her medication as prescribed.  Does not have physicians in the area.  To include no cardiologist (patient with pacer).  Patient appears very depressed, when I asked her why she did not want a vaccination all she would state is that she did not want one.  Began to become tearful   Assessment & Plan: Covid vaccination; no vaccination   Active Problems:   Seizure (HCC)   Unspecified atrial fibrillation (HCC)   History of cardioembolic cerebrovascular accident (CVA)   Chronic systolic CHF (congestive heart failure) (HCC)   CAD (coronary artery disease)   Essential hypertension   Dyslipidemia   Hypothyroidism   Seizure -Dr. Amada Jupiter neurology evaluating patient -EEG pending -Loading dose of Keppra 1000 mg +Keppra 500 mg BID -Patient counseled CANNOT drive for 30 days  Chronic systolic CHF (pacer) -Strict in and out -Daily weight -Coreg 3.125 mg BID -Echocardiogram pending -Once echocardiogram complete will consult cardiology to get patient tied in with heart failure team in [place].  Unspecified atrial fibrillation (on Coumadin) -Currently NSR -Coumadin per pharmacy -Monitor INR Recent Labs  Lab 02/03/20 2014 02/04/20 0841  INR 1.2 1.2  -Subtherapeutic  Essential HTN  -See CHF  Hypothyroidism uncontrolled -7/24 TSH =  12.2  -Increase Synthroid from 125 mcg daily----> 150 mcg daily   CAD  DM type II uncontrolled with complication -7/25 Hemoglobin A1c= 12.9 -Lantus 5 units daily -Sensitive SSI   Dyslipidemia -Lipid panel pending  History of CVA       DVT prophylaxis: Coumadin per pharmacy Code Status: Full Family Communication:  Status is:  Inpatient    Dispo: The patient is from: Home              Anticipated d/c is to: Home              Anticipated d/c date is: 7/26              Patient currently unstable      Consultants:  Neurology  Procedures/Significant Events:  EEG pending    I have personally reviewed and interpreted all radiology studies and my findings are as above.  VENTILATOR SETTINGS:    Cultures   Antimicrobials:    Devices    LINES / TUBES:      Continuous Infusions: . sodium chloride 100 mL/hr at 02/04/20 0600     Objective: Vitals:   02/04/20 0345 02/04/20 0420 02/04/20 0521 02/04/20 0525  BP: 101/66 117/73 (!) 128/95   Pulse: 72 72 82   Resp: 18 21 16    Temp:    97.6 F (36.4 C)  TempSrc:    Oral  SpO2: 93% 95% 91%   Weight:      Height:        Intake/Output Summary (Last 24 hours) at 02/04/2020 1023 Last data filed at 02/04/2020 02/06/2020 Gross per 24 hour  Intake 830 ml  Output 200 ml  Net 630 ml   Filed Weights   02/03/20 1351  Weight: 72.6 kg    Examination:  General: A/O x4, No acute respiratory distress Eyes: negative scleral hemorrhage, negative anisocoria, negative icterus ENT: Negative Runny nose, negative gingival bleeding, Neck:  Negative scars, masses, torticollis, lymphadenopathy, JVD Lungs: Clear to auscultation bilaterally without wheezes or crackles Cardiovascular: Regular rate and rhythm without murmur gallop or rub normal S1 and S2 Abdomen: negative abdominal pain, nondistended, positive soft, bowel sounds, no rebound, no ascites, no appreciable mass Extremities: No significant cyanosis, clubbing, or edema bilateral lower extremities Skin: Negative rashes, lesions, ulcers Psychiatric: Positive depression, negative anxiety, negative fatigue, negative mania  Central nervous system:  Cranial nerves II through XII intact, tongue/uvula midline, all extremities muscle strength 5/5, sensation intact throughout, negative dysarthria, negative  expressive aphasia, negative receptive aphasia.  .     Data Reviewed: Care during the described time interval was provided by me .  I have reviewed this patient's available data, including medical history, events of note, physical examination, and all test results as part of my evaluation.  CBC: Recent Labs  Lab 02/03/20 1403 02/04/20 0022  WBC 15.7* 11.0*  HGB 13.1 11.7*  HCT 42.4 39.3  MCV 93.6 95.9  PLT 299 261   Basic Metabolic Panel: Recent Labs  Lab 02/03/20 1400 02/03/20 2014 02/04/20 0022 02/04/20 0213  NA 141  --  142  --   K 3.9  --  5.6* 3.5  CL 108  --  114*  --   CO2 22  --  14*  --   GLUCOSE 178*  --  76  --   BUN 24*  --  24*  --   CREATININE 0.99  --  0.96  --   CALCIUM 10.0  --  9.0  --  MG  --  1.7  --   --    GFR: Estimated Creatinine Clearance: 52.6 mL/min (by C-G formula based on SCr of 0.96 mg/dL). Liver Function Tests: No results for input(s): AST, ALT, ALKPHOS, BILITOT, PROT, ALBUMIN in the last 168 hours. No results for input(s): LIPASE, AMYLASE in the last 168 hours. No results for input(s): AMMONIA in the last 168 hours. Coagulation Profile: Recent Labs  Lab 02/03/20 2014 02/04/20 0841  INR 1.2 1.2   Cardiac Enzymes: No results for input(s): CKTOTAL, CKMB, CKMBINDEX, TROPONINI in the last 168 hours. BNP (last 3 results) No results for input(s): PROBNP in the last 8760 hours. HbA1C: Recent Labs    02/04/20 0605  HGBA1C 12.9*   CBG: Recent Labs  Lab 02/03/20 2038 02/04/20 0850  GLUCAP 118* 137*   Lipid Profile: No results for input(s): CHOL, HDL, LDLCALC, TRIG, CHOLHDL, LDLDIRECT in the last 72 hours. Thyroid Function Tests: Recent Labs    02/03/20 2014 02/04/20 0605  TSH 12.202*  --   FREET4  --  0.95   Anemia Panel: No results for input(s): VITAMINB12, FOLATE, FERRITIN, TIBC, IRON, RETICCTPCT in the last 72 hours. Sepsis Labs: Recent Labs  Lab 02/03/20 2014 02/04/20 0023 02/04/20 0213 02/04/20 0605  02/04/20 0841  PROCALCITON  --   --  <0.10  --   --   LATICACIDVEN 2.7* 2.4*  --  2.1* 2.0*    Recent Results (from the past 240 hour(s))  Culture, blood (routine x 2)     Status: None (Preliminary result)   Collection Time: 02/03/20  8:14 PM   Specimen: BLOOD  Result Value Ref Range Status   Specimen Description BLOOD SITE NOT SPECIFIED  Final   Special Requests   Final    BOTTLES DRAWN AEROBIC AND ANAEROBIC Blood Culture results may not be optimal due to an inadequate volume of blood received in culture bottles   Culture   Final    NO GROWTH < 12 HOURS Performed at [facility], [address], [place], [place] [address]    Report Status PENDING  Incomplete  SARS Coronavirus 2 by RT PCR (hospital order, performed in [facility] hospital lab) Nasopharyngeal Nasopharyngeal Swab     Status: None   Collection Time: 02/03/20  8:31 PM   Specimen: Nasopharyngeal Swab  Result Value Ref Range Status   SARS Coronavirus 2 NEGATIVE NEGATIVE Final    Comment: (NOTE) SARS-CoV-2 target nucleic acids are NOT DETECTED.  The SARS-CoV-2 RNA is generally detectable in upper and lower respiratory specimens during the acute phase of infection. The lowest concentration of SARS-CoV-2 viral copies this assay can detect is 250 copies / mL. A negative result does not preclude SARS-CoV-2 infection and should not be used as the sole basis for treatment or other patient management decisions.  A negative result may occur with improper specimen collection / handling, submission of specimen other than nasopharyngeal swab, presence of viral mutation(s) within the areas targeted by this assay, and inadequate number of viral copies (<250 copies / mL). A negative result must be combined with clinical observations, patient history, and epidemiological information.  Fact Sheet for Patients:   BoilerBrush.com.cy  Fact Sheet for Healthcare  Providers: https://pope.com/  This test is not yet approved or  cleared by the [place] FDA and has been authorized for detection and/or diagnosis of SARS-CoV-2 by FDA under an Emergency Use Authorization (EUA).  This EUA will remain in effect (meaning this test can be used) for  the duration of the COVID-19 declaration under Section 564(b)(1) of the Act, 21 U.S.C. section 360bbb-3(b)(1), unless the authorization is terminated or revoked sooner.  Performed at [facility], [address] [place], [place] [address]   Culture, blood (routine x 2)     Status: None (Preliminary result)   Collection Time: 02/03/20  9:38 PM   Specimen: BLOOD  Result Value Ref Range Status   Specimen Description BLOOD LEFT UPPER ARM  Final   Special Requests   Final    BOTTLES DRAWN AEROBIC AND ANAEROBIC Blood Culture adequate volume   Culture   Final    NO GROWTH < 12 HOURS Performed at [facility], [address] [place], [place] [address]    Report Status PENDING  Incomplete         Radiology Studies: DG Chest 2 View  Result Date: 02/03/2020 CLINICAL DATA:  Seizures EXAM: CHEST - 2 VIEW COMPARISON:  None. FINDINGS: The lungs are clear. Opacification of the left lung bases related to overlying soft tissue. No pneumothorax or pleural effusion. Cardiac size is mildly enlarged. Left subclavian 3 lead pacemaker defibrillator is in place. Pulmonary vascularity is normal. No acute bone abnormality. IMPRESSION: No active cardiopulmonary disease.  Mild cardiomegaly. Electronically Signed   By: Helyn NumbersAshesh  Parikh MD   On: 02/03/2020 19:43   CT HEAD WO CONTRAST  Result Date: 02/03/2020 CLINICAL DATA:  Seizure. EXAM: CT HEAD WITHOUT CONTRAST TECHNIQUE: Contiguous axial images were obtained from the base of the skull through the vertex without intravenous contrast. COMPARISON:  None. FINDINGS: Brain: No evidence of acute infarction, hemorrhage, hydrocephalus, extra-axial  collection or mass lesion/mass effect. Advanced atrophy and microvascular ischemic changes are noted. Multiple lacunar infarcts are noted involving the bilateral basal 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 posteroinferior right cerebellum (sagittal series 6, image 25). Vascular: No hyperdense vessel or unexpected calcification. Skull: Normal. Negative for fracture or focal lesion. Sinuses/Orbits: No acute finding. Other: None. IMPRESSION: 1. No acute intracranial abnormality. 2. Advanced atrophy and microvascular ischemic changes. 3. Multiple lacunar infarcts are noted involving the bilateral basal 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 posteroinferior right cerebellum. Electronically Signed   By: Katherine Mantlehristopher  Green M.D.   On: 02/03/2020 19:37        Scheduled Meds: . enoxaparin (LOVENOX) injection  40 mg Subcutaneous Q24H  . hydrALAZINE  100 mg Oral TID  . insulin aspart  0-9 Units Subcutaneous TID PC & HS  . levETIRAcetam  500 mg Oral BID  . levothyroxine  125 mcg Oral Q0600  . mirabegron ER  25 mg Oral Daily  . ramipril  5 mg Oral q morning - 10a  . [START ON 02/09/2020] Vitamin D (Ergocalciferol)  50,000 Units Oral Q Fri-1800  . Warfarin - Pharmacist Dosing Inpatient   Does not apply q1600   Continuous Infusions: . sodium chloride 100 mL/hr at 02/04/20 0600     LOS: 1 day    Time spent:40 min    Rasul Decola, Roselind MessierURTIS J, MD [facility] Pager [phone]  If 7PM-7AM, please contact night-coverage [email or web address] Password [email or web address] 02/04/2020, 10:23 AM

## 2020-02-04 NOTE — Progress Notes (Signed)
EEG complete - results pending 

## 2020-02-04 NOTE — Progress Notes (Signed)
ANTICOAGULATION CONSULT NOTE  Pharmacy Consult for warfarin Indication: atrial fibrillation  No Known Allergies  Patient Measurements: Height: 5' (152.4 cm) Weight: 72.6 kg (160 lb) IBW/kg (Calculated) : 45.5   Vital Signs: Temp: 97.6 F (36.4 C) (07/25 0525) Temp Source: Oral (07/25 0525) BP: 128/95 (07/25 0521) Pulse Rate: 82 (07/25 0521)  Labs: Recent Labs    02/03/20 1400 02/03/20 1403 02/03/20 2014 02/03/20 2142 02/04/20 0022 02/04/20 0841  HGB  --  13.1  --   --  11.7*  --   HCT  --  42.4  --   --  39.3  --   PLT  --  299  --   --  261  --   LABPROT  --   --  15.0  --   --  15.2  INR  --   --  1.2  --   --  1.2  CREATININE 0.99  --   --   --  0.96  --   TROPONINIHS  --   --  4 4  --   --     Estimated Creatinine Clearance: 52.6 mL/min (by C-G formula based on SCr of 0.96 mg/dL).   Assessment: 64 yo W on warfarin PTA for afib (CHADS2VASc = 8) with recent loss of family member and noncompliance with medications. INR 1.2 on admission but patient reported taking home dose of 6mg  on 7/23. Previous INR in June was high at 3.4.  INR remains subtherapeutic today at 1.2. Pt is also on prophylactic lovenox. No bleeding noted.   PTA Warfarin dose = 6mg  daily  Goal of Therapy:  INR 2-3 Monitor platelets by anticoagulation protocol: Yes   Plan:  Repeat warfarin 7.5mg  PO x 1 tonight Daily INR DC lovenox when INR is at goal  July, PharmD, BCPS Clinical Pharmacist Please see AMION for all pharmacy numbers 02/04/2020 11:05 AM

## 2020-02-04 NOTE — Progress Notes (Signed)
Patient getting EEG at 300 pm. Will attempt echo later.

## 2020-02-04 NOTE — Procedures (Signed)
Patient Name: Nichole [name]  MRN: [id number]  Epilepsy Attending: Charlsie Quest  Referring Physician/Provider: Dr Valente David Date: 02/04/2020 Duration: 26.57 mins  Patient history: 65 year old female with a history of heart disease requiring defibrillator as well as multiple previous strokes and atrial fibrillation who presents with what sounds like new onset seizure.  EEG to evaluate for seizure  Level of alertness: Awake,asleep  AEDs during EEG study: Keppra  Technical aspects: This EEG study was done with scalp electrodes positioned according to the 10-20 International system of electrode placement. Electrical activity was acquired at a sampling rate of 500Hz  and reviewed with a high frequency filter of 70Hz  and a low frequency filter of 1Hz . EEG data were recorded continuously and digitally stored.   Description: The posterior dominant rhythm consists of 9 Hz activity of moderate voltage (25-35 uV) seen predominantly in posterior head regions, symmetric and reactive to eye opening and eye closing. Sleep was characterized by vertex waves, sleep spindles (12 to 14 Hz), maximal frontocentral region. Hyperventilation and photic stimulation were not performed.     IMPRESSION: This study is within normal limits. No seizures or epileptiform discharges were seen throughout the recording.  [name] 

## 2020-02-04 NOTE — ED Notes (Signed)
RN paged to notify MD of pt repeat lactic acid and potassium level.

## 2020-02-05 ENCOUNTER — Inpatient Hospital Stay (HOSPITAL_COMMUNITY): Payer: Medicare Other

## 2020-02-05 DIAGNOSIS — I5021 Acute systolic (congestive) heart failure: Secondary | ICD-10-CM

## 2020-02-05 LAB — COMPREHENSIVE METABOLIC PANEL
ALT: 16 U/L (ref 0–44)
AST: 31 U/L (ref 15–41)
Albumin: 2.8 g/dL — ABNORMAL LOW (ref 3.5–5.0)
Alkaline Phosphatase: 35 U/L — ABNORMAL LOW (ref 38–126)
Anion gap: 11 (ref 5–15)
BUN: 12 mg/dL (ref 8–23)
CO2: 19 mmol/L — ABNORMAL LOW (ref 22–32)
Calcium: 8.9 mg/dL (ref 8.9–10.3)
Chloride: 113 mmol/L — ABNORMAL HIGH (ref 98–111)
Creatinine, Ser: 0.86 mg/dL (ref 0.44–1.00)
GFR calc Af Amer: >60 mL/min (ref >60)
GFR calc non Af Amer: >60 mL/min (ref >60)
Glucose, Bld: 129 mg/dL — ABNORMAL HIGH (ref 70–99)
Potassium: 3.5 mmol/L (ref 3.5–5.1)
Sodium: 143 mmol/L (ref 135–145)
Total Bilirubin: 0.8 mg/dL (ref 0.3–1.2)
Total Protein: 5.8 g/dL — ABNORMAL LOW (ref 6.5–8.1)

## 2020-02-05 LAB — CBC WITH DIFFERENTIAL/PLATELET
Abs Immature Granulocytes: 0.01 10*3/uL (ref 0.00–0.07)
Basophils Absolute: 0 10*3/uL (ref 0.0–0.1)
Basophils Relative: 1 %
Eosinophils Absolute: 0.3 10*3/uL (ref 0.0–0.5)
Eosinophils Relative: 5 %
HCT: 38.4 % (ref 36.0–46.0)
Hemoglobin: 12.1 g/dL (ref 12.0–15.0)
Immature Granulocytes: 0 %
Lymphocytes Relative: 38 %
Lymphs Abs: 2.8 10*3/uL (ref 0.7–4.0)
MCH: 29.9 pg (ref 26.0–34.0)
MCHC: 31.5 g/dL (ref 30.0–36.0)
MCV: 94.8 fL (ref 80.0–100.0)
Monocytes Absolute: 0.5 10*3/uL (ref 0.1–1.0)
Monocytes Relative: 7 %
Neutro Abs: 3.7 10*3/uL (ref 1.7–7.7)
Neutrophils Relative %: 49 %
Platelets: 207 10*3/uL (ref 150–400)
RBC: 4.05 MIL/uL (ref 3.87–5.11)
RDW: 13.4 % (ref 11.5–15.5)
WBC: 7.3 10*3/uL (ref 4.0–10.5)
nRBC: 0 % (ref 0.0–0.2)

## 2020-02-05 LAB — PROTIME-INR
INR: 1.5 — ABNORMAL HIGH (ref 0.8–1.2)
Prothrombin Time: 17.3 seconds — ABNORMAL HIGH (ref 11.4–15.2)

## 2020-02-05 LAB — ECHOCARDIOGRAM COMPLETE
Area-P 1/2: 3.91 cm2
Calc EF: 37.5 %
Height: 60 in
S' Lateral: 4.6 cm
Single Plane A2C EF: 37 %
Single Plane A4C EF: 40.9 %
Weight: 2560 oz

## 2020-02-05 LAB — MAGNESIUM: Magnesium: 1.2 mg/dL — ABNORMAL LOW (ref 1.7–2.4)

## 2020-02-05 LAB — PHOSPHORUS: Phosphorus: 3.7 mg/dL (ref 2.5–4.6)

## 2020-02-05 LAB — LIPID PANEL
Cholesterol: 194 mg/dL (ref 0–200)
HDL: 47 mg/dL (ref >40)
LDL Cholesterol: 120 mg/dL — ABNORMAL HIGH (ref 0–99)
Total CHOL/HDL Ratio: 4.1 RATIO
Triglycerides: 135 mg/dL (ref <150)
VLDL: 27 mg/dL (ref 0–40)

## 2020-02-05 LAB — GLUCOSE, CAPILLARY
Glucose-Capillary: 149 mg/dL — ABNORMAL HIGH (ref 70–99)
Glucose-Capillary: 178 mg/dL — ABNORMAL HIGH (ref 70–99)
Glucose-Capillary: 407 mg/dL — ABNORMAL HIGH (ref 70–99)
Glucose-Capillary: 141 mg/dL — ABNORMAL HIGH (ref 70–99)

## 2020-02-05 LAB — T3, FREE: T3, Free: 1.4 pg/mL — ABNORMAL LOW (ref 2.0–4.4)

## 2020-02-05 LAB — PROCALCITONIN: Procalcitonin: <0.1 ng/mL

## 2020-02-05 MED ORDER — INSULIN GLARGINE 100 UNIT/ML ~~LOC~~ SOLN
10.0000 [IU] | Freq: Every day | SUBCUTANEOUS | Status: DC
  Administered 2020-02-06 – 2020-02-07 (×2): 10 [IU] via SUBCUTANEOUS
  Filled 2020-02-05 (×2): qty 0.1

## 2020-02-05 MED ORDER — INSULIN ASPART 100 UNIT/ML ~~LOC~~ SOLN
8.0000 [IU] | Freq: Three times a day (TID) | SUBCUTANEOUS | Status: DC
  Administered 2020-02-06 – 2020-02-07 (×4): 8 [IU] via SUBCUTANEOUS

## 2020-02-05 MED ORDER — MAGNESIUM SULFATE 2 GM/50ML IV SOLN
2.0000 g | Freq: Once | INTRAVENOUS | Status: AC
  Administered 2020-02-05: 2 g via INTRAVENOUS
  Filled 2020-02-05: qty 50

## 2020-02-05 MED ORDER — POTASSIUM CHLORIDE CRYS ER 20 MEQ PO TBCR
50.0000 meq | EXTENDED_RELEASE_TABLET | Freq: Once | ORAL | Status: AC
  Administered 2020-02-05: 50 meq via ORAL
  Filled 2020-02-05: qty 1

## 2020-02-05 MED ORDER — INSULIN ASPART 100 UNIT/ML ~~LOC~~ SOLN
0.0000 [IU] | SUBCUTANEOUS | Status: DC
  Administered 2020-02-05: 8 [IU] via SUBCUTANEOUS
  Administered 2020-02-06 – 2020-02-07 (×7): 3 [IU] via SUBCUTANEOUS

## 2020-02-05 MED ORDER — WARFARIN SODIUM 6 MG PO TABS
6.0000 mg | ORAL_TABLET | Freq: Once | ORAL | Status: AC
  Administered 2020-02-05: 6 mg via ORAL
  Filled 2020-02-05: qty 1

## 2020-02-05 MED ORDER — INSULIN GLARGINE 100 UNIT/ML ~~LOC~~ SOLN
5.0000 [IU] | Freq: Once | SUBCUTANEOUS | Status: AC
  Administered 2020-02-05: 5 [IU] via SUBCUTANEOUS
  Filled 2020-02-05: qty 0.05

## 2020-02-05 NOTE — Progress Notes (Signed)
ANTICOAGULATION CONSULT NOTE  Pharmacy Consult for warfarin Indication: atrial fibrillation  No Known Allergies  Patient Measurements: Height: 5' (152.4 cm) Weight: 72.6 kg (160 lb) IBW/kg (Calculated) : 45.5   Vital Signs: Temp: 97 F (36.1 C) (07/26 1223) Temp Source: Oral (07/26 0833) BP: 139/115 (07/26 1225) Pulse Rate: 54 (07/26 1225)  Labs: Recent Labs    02/03/20 1400 02/03/20 1403 02/03/20 1403 02/03/20 2014 02/03/20 2142 02/04/20 0022 02/04/20 0841 02/05/20 0510  HGB  --  13.1   < >  --   --  11.7*  --  12.1  HCT  --  42.4  --   --   --  39.3  --  38.4  PLT  --  299  --   --   --  261  --  207  LABPROT  --   --   --  15.0  --   --  15.2 17.3*  INR  --   --   --  1.2  --   --  1.2 1.5*  CREATININE 0.99  --   --   --   --  0.96  --  0.86  TROPONINIHS  --   --   --  4 4  --   --   --    < > = values in this interval not displayed.    Estimated Creatinine Clearance: 58.7 mL/min (by C-G formula based on SCr of 0.86 mg/dL).   Assessment: 64 yo W on warfarin PTA for afib (CHADS2VASc = 8) with recent loss of family member and noncompliance with medications. INR 1.2 on admission but patient reported taking home dose of 6mg  on 7/23. Previous INR in June was high at 3.4.  INR remains subtherapeutic today at 1.5 but trending up well. Pt is also on prophylactic lovenox. No bleeding noted. H/H and Plt wnl   PTA Warfarin dose = 6mg  daily  Goal of Therapy:  INR 2-3 Monitor platelets by anticoagulation protocol: Yes   Plan:  Repeat warfarin 6mg  PO x 1 tonight Daily INR DC lovenox when INR is at goal  July, PharmD., BCPS, BCCCP Clinical Pharmacist Clinical phone for 02/05/20 until 3:30pm: [phone] If after 3:30pm, please refer to [facility] for unit-specific pharmacist

## 2020-02-05 NOTE — Progress Notes (Signed)
Attempted to set up follow up appt at [facility] [phone]). There was nobody available, message left. Will attempt again later today.

## 2020-02-05 NOTE — Progress Notes (Signed)
PROGRESS NOTE    Franceska Strahm  NWG:[id number] DOB: Feb 17, 1956 DOA: 02/03/2020 PCP: Patient, No Pcp Per     Brief Narrative:  Nichole [name]  is a 64 y.o. BF PMHX CVA, asthma, chronic atrial fibrillation on Coumadin chronic systolic CHF (pacer),  CAD, HTN,  dyslipidemia   Presented to the emergency room with the onset of suspected seizure.  The patient was noted by her daughter to have shaking, Eglitis with unresponsiveness.  When she recovered however she was alert and oriented.  The patient denied any urinary or stool incontinence or tongue bites.  The patient was recently admitted to [facility] for sepsis and UTI was placed on mechanical intubation.  She was discharged to skilled nursing facility and then went home about 3 weeks ago.  Her husband died on 01/17/23.  She apparently has been missing her medications till 713 when she was staying alone and after that she moved with her daughter.  Her daughter stated that she has been taking her medications regularly with her.  No reported fever or chills.  No nausea or vomiting or abdominal pain.  No chest pain or palpitations.  No dysuria, oliguria or hematuria or flank pain.  She denies any new paresthesias or focal muscle weakness beyond her residual weakness from previous stroke.  Upon presentation to the emergency room, vital signs were within normal.  Labs revealed BMP with glucose of 178 and BUN of 24.  Magnesium level is 1.7.  High-sensitivity troponin I was 4 lactic acid 2.7.  CBC showed leukocytosis of 15.7.  TSH came back elevated at 12.2.  COVID-19 PCR came back negative.  Scan showed no acute intracranial normalities.  It also showed advanced atrophy and microvascular ischemic change as well as multiple lacunar infarcts involving both basal ganglia, presumably chronic with old infarct involving the posterior inferior right cerebellum.  Two-view chest ray showed mild cardiomegaly with no acute cardiopulmonary disease.  The  patient was given IV Keppra and 2 L bolus of IV normal saline.  She will be admitted to a telemetry medical bed for further evaluation and management   Subjective: 7/26 A/O x4, very flat affect.  Negative CP, negative S OB, negative abdominal pain.   Assessment & Plan: Covid vaccination; no vaccination   Active Problems:   Seizure (HCC)   Unspecified atrial fibrillation (HCC)   History of cardioembolic cerebrovascular accident (CVA)   Chronic systolic CHF (congestive heart failure) (HCC)   CAD (coronary artery disease)   Essential hypertension   Dyslipidemia   Hypothyroidism   Seizure -Dr. Amada Jupiter neurology evaluating patient -EEG pending -Loading dose of Keppra 1000 mg +Keppra 500 mg BID -Patient counseled CANNOT drive for 30 days -5/78 schedule appointment for follow-up seizure in 6 weeks first available [facility]ist at [facility]  Chronic systolic CHF (pacer) -Strict in and out -Daily weight -Coreg 3.125 mg BID -Echocardiogram pending -7/26 consulted CHF team who has scheduled patient for outpatient follow-up appointment.Marland Kitchen  Unspecified atrial fibrillation (on Coumadin) -Currently NSR -Coumadin per pharmacy -Monitor INR Recent Labs  Lab 02/03/20 2014 02/04/20 0841 02/05/20 0510  INR 1.2 1.2 1.5*  -Subtherapeutic -Patient has received Coumadin 7.5 mg over the last 2 days.  Patient's normal dose is 5 mg should start seeing affect by Wednesday or Thursday.  We will then drop down to her normal dose.  Essential HTN  -See CHF  Hypothyroidism uncontrolled -7/24 TSH = 12.2  -Increase Synthroid from 125 mcg daily----> 150 mcg daily   CAD  DM type II uncontrolled with complication -7/25 Hemoglobin A1c= 12.9 -7/26 increase Lantus 10 units daily -7/26 NovoLog 8 units qac  -7/26 moderate SSI   Dyslipidemia -Lipid panel pending  History of CVA       DVT prophylaxis: Coumadin per pharmacy Code Status: Full Family Communication:   Status is: Inpatient    Dispo: The patient is from: Home              Anticipated d/c is to: Home              Anticipated d/c date is: 7/26              Patient currently unstable      Consultants:  Neurology  Procedures/Significant Events:  EEG pending    I have personally reviewed and interpreted all radiology studies and my findings are as above.  VENTILATOR SETTINGS:    Cultures   Antimicrobials:    Devices    LINES / TUBES:      Continuous Infusions: . sodium chloride 100 mL/hr at 02/05/20 0000     Objective: Vitals:   02/04/20 1938 02/04/20 2343 02/05/20 0404 02/05/20 0833  BP: (!) 112/54   128/67  Pulse: 73   61  Resp: 19   20  Temp: 97.9 F (36.6 C) 98.3 F (36.8 C) 98.1 F (36.7 C) 98.3 F (36.8 C)  TempSrc: Oral Oral Oral Oral  SpO2: 93%   94%  Weight:      Height:        Intake/Output Summary (Last 24 hours) at 02/05/2020 1031 Last data filed at 02/05/2020 0962 Gross per 24 hour  Intake 1351.66 ml  Output 1250 ml  Net 101.66 ml   Filed Weights   02/03/20 1351  Weight: 72.6 kg    Examination:  General: A/O x4, No acute respiratory distress Eyes: negative scleral hemorrhage, negative anisocoria, negative icterus ENT: Negative Runny nose, negative gingival bleeding, Neck:  Negative scars, masses, torticollis, lymphadenopathy, JVD Lungs: Clear to auscultation bilaterally without wheezes or crackles Cardiovascular: Regular rate and rhythm without murmur gallop or rub normal S1 and S2 Abdomen: negative abdominal pain, nondistended, positive soft, bowel sounds, no rebound, no ascites, no appreciable mass Extremities: No significant cyanosis, clubbing, or edema bilateral lower extremities Skin: Negative rashes, lesions, ulcers Psychiatric: Positive depression, negative anxiety, negative fatigue, negative mania  Central nervous system:  Cranial nerves II through XII intact, tongue/uvula midline, all extremities muscle strength  5/5, sensation intact throughout, negative dysarthria, negative expressive aphasia, negative receptive aphasia.  .     Data Reviewed: Care during the described time interval was provided by me .  I have reviewed this patient's available data, including medical history, events of note, physical examination, and all test results as part of my evaluation.  CBC: Recent Labs  Lab 02/03/20 1403 02/04/20 0022 02/05/20 0510  WBC 15.7* 11.0* 7.3  NEUTROABS  --   --  3.7  HGB 13.1 11.7* 12.1  HCT 42.4 39.3 38.4  MCV 93.6 95.9 94.8  PLT 299 261 207   Basic Metabolic Panel: Recent Labs  Lab 02/03/20 1400 02/03/20 2014 02/04/20 0022 02/04/20 0213 02/05/20 0510  NA 141  --  142  --  143  K 3.9  --  5.6* 3.5 3.5  CL 108  --  114*  --  113*  CO2 22  --  14*  --  19*  GLUCOSE 178*  --  76  --  129*  BUN 24*  --  24*  --  12  CREATININE 0.99  --  0.96  --  0.86  CALCIUM 10.0  --  9.0  --  8.9  MG  --  1.7  --   --  1.2*  PHOS  --   --   --   --  3.7   GFR: Estimated Creatinine Clearance: 58.7 mL/min (by C-G formula based on SCr of 0.86 mg/dL). Liver Function Tests: Recent Labs  Lab 02/05/20 0510  AST 31  ALT 16  ALKPHOS 35*  BILITOT 0.8  PROT 5.8*  ALBUMIN 2.8*   No results for input(s): LIPASE, AMYLASE in the last 168 hours. No results for input(s): AMMONIA in the last 168 hours. Coagulation Profile: Recent Labs  Lab 02/03/20 2014 02/04/20 0841 02/05/20 0510  INR 1.2 1.2 1.5*   Cardiac Enzymes: No results for input(s): CKTOTAL, CKMB, CKMBINDEX, TROPONINI in the last 168 hours. BNP (last 3 results) No results for input(s): PROBNP in the last 8760 hours. HbA1C: Recent Labs    02/04/20 0605  HGBA1C 12.9*   CBG: Recent Labs  Lab 02/04/20 0850 02/04/20 1130 02/04/20 1535 02/04/20 2201 02/05/20 0833  GLUCAP 137* 132* 159* 227* 149*   Lipid Profile: Recent Labs    02/05/20 0510  CHOL 194  HDL 47  LDLCALC 120*  TRIG 135  CHOLHDL 4.1   Thyroid  Function Tests: Recent Labs    02/03/20 2014 02/04/20 0605  TSH 12.202*  --   FREET4  --  0.95   Anemia Panel: No results for input(s): VITAMINB12, FOLATE, FERRITIN, TIBC, IRON, RETICCTPCT in the last 72 hours. Sepsis Labs: Recent Labs  Lab 02/04/20 0023 02/04/20 0213 02/04/20 0605 02/04/20 0841 02/04/20 1106 02/05/20 0510  PROCALCITON  --  <0.10  --   --   --  <0.10  LATICACIDVEN 2.4*  --  2.1* 2.0* 1.8  --     Recent Results (from the past 240 hour(s))  Culture, blood (routine x 2)     Status: None (Preliminary result)   Collection Time: 02/03/20  8:14 PM   Specimen: BLOOD  Result Value Ref Range Status   Specimen Description BLOOD SITE NOT SPECIFIED  Final   Special Requests   Final    BOTTLES DRAWN AEROBIC AND ANAEROBIC Blood Culture results may not be optimal due to an inadequate volume of blood received in culture bottles   Culture   Final    NO GROWTH 2 DAYS Performed at [facility], [address] [place], [place] [address]    Report Status PENDING  Incomplete  SARS Coronavirus 2 by RT PCR (hospital order, performed in [facility] hospital lab) Nasopharyngeal Nasopharyngeal Swab     Status: None   Collection Time: 02/03/20  8:31 PM   Specimen: Nasopharyngeal Swab  Result Value Ref Range Status   SARS Coronavirus 2 NEGATIVE NEGATIVE Final    Comment: (NOTE) SARS-CoV-2 target nucleic acids are NOT DETECTED.  The SARS-CoV-2 RNA is generally detectable in upper and lower respiratory specimens during the acute phase of infection. The lowest concentration of SARS-CoV-2 viral copies this assay can detect is 250 copies / mL. A negative result does not preclude SARS-CoV-2 infection and should not be used as the sole basis for treatment or other patient management decisions.  A negative result may occur with improper specimen collection / handling, submission of specimen other than nasopharyngeal swab, presence of viral mutation(s) within the areas targeted  by this assay, and inadequate number of viral  copies (<250 copies / mL). A negative result must be combined with clinical observations, patient history, and epidemiological information.  Fact Sheet for Patients:   BoilerBrush.com.cyhttps://www.fda.gov/media/136312/download  Fact Sheet for Healthcare Providers: https://pope.com/https://www.fda.gov/media/136313/download  This test is not yet approved or  cleared by the [place] FDA and has been authorized for detection and/or diagnosis of SARS-CoV-2 by FDA under an Emergency Use Authorization (EUA).  This EUA will remain in effect (meaning this test can be used) for the duration of the COVID-19 declaration under Section 564(b)(1) of the Act, 21 U.S.C. section 360bbb-3(b)(1), unless the authorization is terminated or revoked sooner.  Performed at [facility], [address] [place], [place] [address]   Urine culture     Status: None (Preliminary result)   Collection Time: 02/03/20  8:44 PM   Specimen: Urine, Random  Result Value Ref Range Status   Specimen Description URINE, RANDOM  Final   Special Requests NONE  Final   Culture   Final    CULTURE REINCUBATED FOR BETTER GROWTH Performed at [facility], [address] [place], [place] [address]    Report Status PENDING  Incomplete  Culture, blood (routine x 2)     Status: None (Preliminary result)   Collection Time: 02/03/20  9:38 PM   Specimen: BLOOD  Result Value Ref Range Status   Specimen Description BLOOD LEFT UPPER ARM  Final   Special Requests   Final    BOTTLES DRAWN AEROBIC AND ANAEROBIC Blood Culture adequate volume   Culture   Final    NO GROWTH 2 DAYS Performed at [facility], [address] [place], [place] [address]    Report Status PENDING  Incomplete         Radiology Studies: EEG  Result Date: 02/04/2020 Charlsie QuestYadav, Priyanka O, MD     02/04/2020  4:07 PM Patient Name: Karenann Caingela Polkowski MRN: [id number] Epilepsy Attending: Charlsie QuestPriyanka O Yadav Referring Physician/Provider:  Dr Valente DavidJan Mansy Date: 02/04/2020 Duration: 26.57 mins Patient history: 64 year old female with a history of heart disease requiring defibrillator as well as multiple previous strokes and atrial fibrillation who presents with what sounds like new onset seizure.  EEG to evaluate for seizure Level of alertness: Awake,asleep AEDs during EEG study: Keppra Technical aspects: This EEG study was done with scalp electrodes positioned according to the 10-20 International system of electrode placement. Electrical activity was acquired at a sampling rate of 500Hz  and reviewed with a high frequency filter of 70Hz  and a low frequency filter of 1Hz . EEG data were recorded continuously and digitally stored. Description: The posterior dominant rhythm consists of 9 Hz activity of moderate voltage (25-35 uV) seen predominantly in posterior head regions, symmetric and reactive to eye opening and eye closing. Sleep was characterized by vertex waves, sleep spindles (12 to 14 Hz), maximal frontocentral region. Hyperventilation and photic stimulation were not performed.   IMPRESSION: This study is within normal limits. No seizures or epileptiform discharges were seen throughout the recording. Charlsie QuestPriyanka O Yadav   DG Chest 2 View  Result Date: 02/03/2020 CLINICAL DATA:  Seizures EXAM: CHEST - 2 VIEW COMPARISON:  None. FINDINGS: The lungs are clear. Opacification of the left lung bases related to overlying soft tissue. No pneumothorax or pleural effusion. Cardiac size is mildly enlarged. Left subclavian 3 lead pacemaker defibrillator is in place. Pulmonary vascularity is normal. No acute bone abnormality. IMPRESSION: No active cardiopulmonary disease.  Mild cardiomegaly. Electronically Signed   By: Helyn NumbersAshesh  Parikh MD  On: 02/03/2020 19:43   CT HEAD WO CONTRAST  Result Date: 02/03/2020 CLINICAL DATA:  Seizure. EXAM: CT HEAD WITHOUT CONTRAST TECHNIQUE: Contiguous axial images were obtained from the base of the skull through the vertex  without intravenous contrast. COMPARISON:  None. FINDINGS: Brain: No evidence of acute infarction, hemorrhage, hydrocephalus, extra-axial collection or mass lesion/mass effect. Advanced atrophy and microvascular ischemic changes are noted. Multiple lacunar infarcts are noted involving the bilateral basal 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 posteroinferior right cerebellum (sagittal series 6, image 25). Vascular: No hyperdense vessel or unexpected calcification. Skull: Normal. Negative for fracture or focal lesion. Sinuses/Orbits: No acute finding. Other: None. IMPRESSION: 1. No acute intracranial abnormality. 2. Advanced atrophy and microvascular ischemic changes. 3. Multiple lacunar infarcts are noted involving the bilateral basal 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 posteroinferior right cerebellum. Electronically Signed   By: Katherine Mantle M.D.   On: 02/03/2020 19:37        Scheduled Meds: . carvedilol  3.125 mg Oral BID WC  . enoxaparin (LOVENOX) injection  40 mg Subcutaneous Q24H  . hydrALAZINE  100 mg Oral TID  . insulin aspart  0-9 Units Subcutaneous TID PC & HS  . insulin glargine  5 Units Subcutaneous Daily  . levETIRAcetam  500 mg Oral BID  . levothyroxine  150 mcg Oral Q0600  . mirabegron ER  25 mg Oral Daily  . ramipril  5 mg Oral q morning - 10a  . [START ON 02/09/2020] Vitamin D (Ergocalciferol)  50,000 Units Oral Q Fri-1800  . Warfarin - Pharmacist Dosing Inpatient   Does not apply q1600   Continuous Infusions: . sodium chloride 100 mL/hr at 02/05/20 0000     LOS: 2 days    Time spent:40 min    Dal Blew, Roselind Messier, MD [facility] Pager [phone]  If 7PM-7AM, please contact night-coverage [email or web address] Password [facility] 02/05/2020, 10:31 AM

## 2020-02-05 NOTE — Progress Notes (Signed)
  Echocardiogram 2D Echocardiogram has been performed.  Nichole [name] 02/05/2020, 11:44 AM

## 2020-02-06 ENCOUNTER — Other Ambulatory Visit: Payer: Self-pay

## 2020-02-06 ENCOUNTER — Encounter (HOSPITAL_COMMUNITY): Payer: Self-pay | Admitting: Family Medicine

## 2020-02-06 LAB — GLUCOSE, CAPILLARY
Glucose-Capillary: 163 mg/dL — ABNORMAL HIGH (ref 70–99)
Glucose-Capillary: 171 mg/dL — ABNORMAL HIGH (ref 70–99)
Glucose-Capillary: 179 mg/dL — ABNORMAL HIGH (ref 70–99)
Glucose-Capillary: 188 mg/dL — ABNORMAL HIGH (ref 70–99)
Glucose-Capillary: 252 mg/dL — ABNORMAL HIGH (ref 70–99)
Glucose-Capillary: 61 mg/dL — ABNORMAL LOW (ref 70–99)
Glucose-Capillary: 67 mg/dL — ABNORMAL LOW (ref 70–99)
Glucose-Capillary: 85 mg/dL (ref 70–99)
Glucose-Capillary: 86 mg/dL (ref 70–99)

## 2020-02-06 LAB — CBC WITH DIFFERENTIAL/PLATELET
Abs Immature Granulocytes: 0.04 10*3/uL (ref 0.00–0.07)
Basophils Absolute: 0.1 10*3/uL (ref 0.0–0.1)
Basophils Relative: 1 %
Eosinophils Absolute: 0.3 10*3/uL (ref 0.0–0.5)
Eosinophils Relative: 5 %
HCT: 34.6 % — ABNORMAL LOW (ref 36.0–46.0)
Hemoglobin: 11.1 g/dL — ABNORMAL LOW (ref 12.0–15.0)
Immature Granulocytes: 1 %
Lymphocytes Relative: 37 %
Lymphs Abs: 2.4 10*3/uL (ref 0.7–4.0)
MCH: 29.8 pg (ref 26.0–34.0)
MCHC: 32.1 g/dL (ref 30.0–36.0)
MCV: 93 fL (ref 80.0–100.0)
Monocytes Absolute: 0.6 10*3/uL (ref 0.1–1.0)
Monocytes Relative: 9 %
Neutro Abs: 3.1 10*3/uL (ref 1.7–7.7)
Neutrophils Relative %: 47 %
Platelets: ADEQUATE 10*3/uL (ref 150–400)
RBC: 3.72 MIL/uL — ABNORMAL LOW (ref 3.87–5.11)
RDW: 13.6 % (ref 11.5–15.5)
WBC: 6.4 10*3/uL (ref 4.0–10.5)
nRBC: 0 % (ref 0.0–0.2)

## 2020-02-06 LAB — COMPREHENSIVE METABOLIC PANEL
ALT: 15 U/L (ref 0–44)
AST: 26 U/L (ref 15–41)
Albumin: 2.7 g/dL — ABNORMAL LOW (ref 3.5–5.0)
Alkaline Phosphatase: 37 U/L — ABNORMAL LOW (ref 38–126)
Anion gap: 5 (ref 5–15)
BUN: 11 mg/dL (ref 8–23)
CO2: 19 mmol/L — ABNORMAL LOW (ref 22–32)
Calcium: 8.5 mg/dL — ABNORMAL LOW (ref 8.9–10.3)
Chloride: 118 mmol/L — ABNORMAL HIGH (ref 98–111)
Creatinine, Ser: 0.88 mg/dL (ref 0.44–1.00)
GFR calc Af Amer: >60 mL/min (ref >60)
GFR calc non Af Amer: >60 mL/min (ref >60)
Glucose, Bld: 166 mg/dL — ABNORMAL HIGH (ref 70–99)
Potassium: 4.2 mmol/L (ref 3.5–5.1)
Sodium: 142 mmol/L (ref 135–145)
Total Bilirubin: 0.5 mg/dL (ref 0.3–1.2)
Total Protein: 5.4 g/dL — ABNORMAL LOW (ref 6.5–8.1)

## 2020-02-06 LAB — PHOSPHORUS: Phosphorus: 3.1 mg/dL (ref 2.5–4.6)

## 2020-02-06 LAB — PROCALCITONIN: Procalcitonin: <0.1 ng/mL

## 2020-02-06 LAB — MAGNESIUM: Magnesium: 1.7 mg/dL (ref 1.7–2.4)

## 2020-02-06 LAB — URINE CULTURE: Culture: >=100000 — AB

## 2020-02-06 LAB — PROTIME-INR
INR: 1.9 — ABNORMAL HIGH (ref 0.8–1.2)
Prothrombin Time: 20.8 seconds — ABNORMAL HIGH (ref 11.4–15.2)

## 2020-02-06 MED ORDER — LIVING WELL WITH DIABETES BOOK
Freq: Once | Status: AC
  Filled 2020-02-06: qty 1

## 2020-02-06 MED ORDER — WARFARIN SODIUM 6 MG PO TABS
6.0000 mg | ORAL_TABLET | Freq: Once | ORAL | Status: AC
  Administered 2020-02-06: 6 mg via ORAL
  Filled 2020-02-06: qty 1

## 2020-02-06 NOTE — Progress Notes (Signed)
ANTICOAGULATION CONSULT NOTE  Pharmacy Consult for warfarin Indication: atrial fibrillation  No Known Allergies  Patient Measurements: Height: 5' (152.4 cm) Weight: 72.7 kg (160 lb 4.4 oz) IBW/kg (Calculated) : 45.5   Vital Signs: Temp: 98 F (36.7 C) (07/27 1218) Temp Source: Oral (07/27 1218) BP: 136/82 (07/27 1218) Pulse Rate: 67 (07/27 1218)  Labs: Recent Labs    02/03/20 1403 02/03/20 2014 02/03/20 2014 02/03/20 2142 02/04/20 0022 02/04/20 0022 02/04/20 0841 02/05/20 0510 02/06/20 0449  HGB   < >  --   --   --  11.7*   < >  --  12.1 11.1*  HCT   < >  --   --   --  39.3  --   --  38.4 34.6*  PLT   < >  --   --   --  261  --   --  207 PLATELET CLUMPS NOTED ON SMEAR, COUNT APPEARS ADEQUATE  LABPROT  --  15.0   < >  --   --   --  15.2 17.3* 20.8*  INR  --  1.2   < >  --   --   --  1.2 1.5* 1.9*  CREATININE   < >  --   --   --  0.96  --   --  0.86 0.88  TROPONINIHS  --  4  --  4  --   --   --   --   --    < > = values in this interval not displayed.    Estimated Creatinine Clearance: 57.5 mL/min (by C-G formula based on SCr of 0.88 mg/dL).   Assessment: 64 yo W on warfarin PTA for afib (CHADS2VASc = 8) with recent loss of family member and noncompliance with medications. INR 1.2 on admission but patient reported taking home dose of 6mg  on 7/23. Previous INR in June was high at 3.4.  INR remains subtherapeutic today at 1.9 but trending up well. Pt is also on prophylactic lovenox. No bleeding noted. H/H and Plt wnl   PTA Warfarin dose = 6mg  daily  Goal of Therapy:  INR 2-3 Monitor platelets by anticoagulation protocol: Yes   Plan:  Warfarin 6 mg x 1 dose  Daily INR DC lovenox when INR is at goal  July, PharmD., BCPS, BCCCP Clinical Pharmacist Clinical phone for 02/06/20 until 3:30pm: [phone] If after 3:30pm, please refer to [facility] for unit-specific pharmacist

## 2020-02-06 NOTE — Progress Notes (Signed)
PROGRESS NOTE    Nichole [name]  ZOX:[id number] DOB: Dec 01, 1955 DOA: 02/03/2020 PCP: Patient, No Pcp Per     Brief Narrative:  Nichole [name]  is a 64 y.o. BF PMHX CVA, asthma, chronic atrial fibrillation on Coumadin chronic systolic CHF (pacer),  CAD, HTN,  dyslipidemia   Presented to the emergency room with the onset of suspected seizure.  The patient was noted by her daughter to have shaking, Eglitis with unresponsiveness.  When she recovered however she was alert and oriented.  The patient denied any urinary or stool incontinence or tongue bites.  The patient was recently admitted to [facility] for sepsis and UTI was placed on mechanical intubation.  She was discharged to skilled nursing facility and then went home about 3 weeks ago.  Her husband died on 20-Jan-2023.  She apparently has been missing her medications till 713 when she was staying alone and after that she moved with her daughter.  Her daughter stated that she has been taking her medications regularly with her.  No reported fever or chills.  No nausea or vomiting or abdominal pain.  No chest pain or palpitations.  No dysuria, oliguria or hematuria or flank pain.  She denies any new paresthesias or focal muscle weakness beyond her residual weakness from previous stroke.  Upon presentation to the emergency room, vital signs were within normal.  Labs revealed BMP with glucose of 178 and BUN of 24.  Magnesium level is 1.7.  High-sensitivity troponin I was 4 lactic acid 2.7.  CBC showed leukocytosis of 15.7.  TSH came back elevated at 12.2.  COVID-19 PCR came back negative.  Scan showed no acute intracranial normalities.  It also showed advanced atrophy and microvascular ischemic change as well as multiple lacunar infarcts involving both basal ganglia, presumably chronic with old infarct involving the posterior inferior right cerebellum.  Two-view chest ray showed mild cardiomegaly with no acute cardiopulmonary disease.  The  patient was given IV Keppra and 2 L bolus of IV normal saline.  She will be admitted to a telemetry medical bed for further evaluation and management   Subjective: 7/27  A/O x4, very flat affect.  Negative CP, negative S OB, negative abdominal pain.   Assessment & Plan: Covid vaccination; no vaccination   Active Problems:   Seizure (HCC)   Unspecified atrial fibrillation (HCC)   History of cardioembolic cerebrovascular accident (CVA)   Chronic systolic CHF (congestive heart failure) (HCC)   CAD (coronary artery disease)   Essential hypertension   Dyslipidemia   Hypothyroidism   Seizure -Dr. Amada Jupiter neurology evaluating patient -EEG pending -Loading dose of Keppra 1000 mg +Keppra 500 mg BID -Patient counseled CANNOT drive for 30 days -follow up appointment with [facility] will be on 03/29/2020 at 09:30 with Dr. Marjory Lies  Chronic systolic CHF (pacer) -Strict in and out -Daily weight -Coreg 3.125 mg BID -Echocardiogram pending -7/26 consulted CHF team who has scheduled patient for outpatient follow-up appointment.. -8/25 establish care doctor Jacques Navy  900    Paroxysmal atrial fibrillation (on Coumadin) -Currently NSR -Coumadin per pharmacy -Monitor INR Recent Labs  Lab 02/03/20 2014 02/04/20 0841 02/05/20 0510 02/06/20 0449  INR 1.2 1.2 1.5* 1.9*  -Subtherapeutic -Patient has received Coumadin 7.5 mg over the last 2 days.   -7/27 patient should be able to be discharged in the A.m. on Coumadin 6 mg, will need to be checked on Monday -Pt is from [place]- has PCP there- and has an appointment there for Monday  at 2pm   Essential HTN  -See CHF  Hypothyroidism uncontrolled -7/24 TSH = 12.2  -Increase Synthroid from 125 mcg daily----> 150 mcg daily   CAD  DM type II uncontrolled with complication -7/25 Hemoglobin A1c= 12.9 -7/26 increase Lantus 10 units daily -7/26 NovoLog 8 units qac  -7/26 moderate SSI   Dyslipidemia -Lipid  panel pending  History of CVA       DVT prophylaxis: Coumadin per pharmacy Code Status: Full Family Communication: 7/27 spoke with daughter explained plan of care. Was cut off when system crashed  Status is: Inpatient    Dispo: The patient is from: Home              Anticipated d/c is to: Home              Anticipated d/c date is: 7/26              Patient currently unstable      Consultants:  Neurology  Procedures/Significant Events:  EEG pending    I have personally reviewed and interpreted all radiology studies and my findings are as above.  VENTILATOR SETTINGS:    Cultures   Antimicrobials:    Devices    LINES / TUBES:      Continuous Infusions: . sodium chloride 100 mL/hr at 02/05/20 2239     Objective: Vitals:   02/06/20 0451 02/06/20 0807 02/06/20 0900 02/06/20 1000  BP:  (!) 134/88    Pulse:  62 66   Resp:  23 (!) 26 21  Temp:  98.1 F (36.7 C)    TempSrc:  Oral    SpO2:  96% 95%   Weight: 72.7 kg     Height:        Intake/Output Summary (Last 24 hours) at 02/06/2020 1014 Last data filed at 02/06/2020 1000 Gross per 24 hour  Intake 4494.86 ml  Output 900 ml  Net 3594.86 ml   Filed Weights   02/03/20 1351 02/06/20 0451  Weight: 72.6 kg 72.7 kg    Examination:  General: A/O x4, No acute respiratory distress Eyes: negative scleral hemorrhage, negative anisocoria, negative icterus ENT: Negative Runny nose, negative gingival bleeding, Neck:  Negative scars, masses, torticollis, lymphadenopathy, JVD Lungs: Clear to auscultation bilaterally without wheezes or crackles Cardiovascular: Regular rate and rhythm without murmur gallop or rub normal S1 and S2 Abdomen: negative abdominal pain, nondistended, positive soft, bowel sounds, no rebound, no ascites, no appreciable mass Extremities: No significant cyanosis, clubbing, or edema bilateral lower extremities Skin: Negative rashes, lesions, ulcers Psychiatric: Positive  depression, negative anxiety, negative fatigue, negative mania  Central nervous system:  Cranial nerves II through XII intact, tongue/uvula midline, all extremities muscle strength 5/5, sensation intact throughout, negative dysarthria, negative expressive aphasia, negative receptive aphasia.  .     Data Reviewed: Care during the described time interval was provided by me .  I have reviewed this patient's available data, including medical history, events of note, physical examination, and all test results as part of my evaluation.  CBC: Recent Labs  Lab 02/03/20 1403 02/04/20 0022 02/05/20 0510 02/06/20 0449  WBC 15.7* 11.0* 7.3 6.4  NEUTROABS  --   --  3.7 3.1  HGB 13.1 11.7* 12.1 11.1*  HCT 42.4 39.3 38.4 34.6*  MCV 93.6 95.9 94.8 93.0  PLT 299 261 207 PLATELET CLUMPS NOTED ON SMEAR, COUNT APPEARS ADEQUATE   Basic Metabolic Panel: Recent Labs  Lab 02/03/20 1400 02/03/20 2014 02/04/20 0022 02/04/20 0213 02/05/20 0510 02/06/20  0449  NA 141  --  142  --  143 142  K 3.9  --  5.6* 3.5 3.5 4.2  CL 108  --  114*  --  113* 118*  CO2 22  --  14*  --  19* 19*  GLUCOSE 178*  --  76  --  129* 166*  BUN 24*  --  24*  --  12 11  CREATININE 0.99  --  0.96  --  0.86 0.88  CALCIUM 10.0  --  9.0  --  8.9 8.5*  MG  --  1.7  --   --  1.2* 1.7  PHOS  --   --   --   --  3.7 3.1   GFR: Estimated Creatinine Clearance: 57.5 mL/min (by C-G formula based on SCr of 0.88 mg/dL). Liver Function Tests: Recent Labs  Lab 02/05/20 0510 02/06/20 0449  AST 31 26  ALT 16 15  ALKPHOS 35* 37*  BILITOT 0.8 0.5  PROT 5.8* 5.4*  ALBUMIN 2.8* 2.7*   No results for input(s): LIPASE, AMYLASE in the last 168 hours. No results for input(s): AMMONIA in the last 168 hours. Coagulation Profile: Recent Labs  Lab 02/03/20 2014 02/04/20 0841 02/05/20 0510 02/06/20 0449  INR 1.2 1.2 1.5* 1.9*   Cardiac Enzymes: No results for input(s): CKTOTAL, CKMB, CKMBINDEX, TROPONINI in the last 168 hours. BNP  (last 3 results) No results for input(s): PROBNP in the last 8760 hours. HbA1C: Recent Labs    02/04/20 0605  HGBA1C 12.9*   CBG: Recent Labs  Lab 02/05/20 2208 02/05/20 2352 02/06/20 0019 02/06/20 0454 02/06/20 0805  GLUCAP 141* 61* 85 163* 171*   Lipid Profile: Recent Labs    02/05/20 0510  CHOL 194  HDL 47  LDLCALC 120*  TRIG 135  CHOLHDL 4.1   Thyroid Function Tests: Recent Labs    02/03/20 2014 02/04/20 0605  TSH 12.202*  --   FREET4  --  0.95  T3FREE  --  1.4*   Anemia Panel: No results for input(s): VITAMINB12, FOLATE, FERRITIN, TIBC, IRON, RETICCTPCT in the last 72 hours. Sepsis Labs: Recent Labs  Lab 02/04/20 0023 02/04/20 0213 02/04/20 0605 02/04/20 0841 02/04/20 1106 02/05/20 0510 02/06/20 0449  PROCALCITON  --  <0.10  --   --   --  <0.10 <0.10  LATICACIDVEN 2.4*  --  2.1* 2.0* 1.8  --   --     Recent Results (from the past 240 hour(s))  Culture, blood (routine x 2)     Status: None (Preliminary result)   Collection Time: 02/03/20  8:14 PM   Specimen: BLOOD  Result Value Ref Range Status   Specimen Description BLOOD SITE NOT SPECIFIED  Final   Special Requests   Final    BOTTLES DRAWN AEROBIC AND ANAEROBIC Blood Culture results may not be optimal due to an inadequate volume of blood received in culture bottles   Culture   Final    NO GROWTH 2 DAYS Performed at [facility], [address], [place], [place] [address]    Report Status PENDING  Incomplete  SARS Coronavirus 2 by RT PCR (hospital order, performed in [facility] hospital lab) Nasopharyngeal Nasopharyngeal Swab     Status: None   Collection Time: 02/03/20  8:31 PM   Specimen: Nasopharyngeal Swab  Result Value Ref Range Status   SARS Coronavirus 2 NEGATIVE NEGATIVE Final    Comment: (NOTE) SARS-CoV-2 target nucleic acids are NOT DETECTED.  The  SARS-CoV-2 RNA is generally detectable in upper and lower respiratory specimens during the acute phase of infection. The  lowest concentration of SARS-CoV-2 viral copies this assay can detect is 250 copies / mL. A negative result does not preclude SARS-CoV-2 infection and should not be used as the sole basis for treatment or other patient management decisions.  A negative result may occur with improper specimen collection / handling, submission of specimen other than nasopharyngeal swab, presence of viral mutation(s) within the areas targeted by this assay, and inadequate number of viral copies (<250 copies / mL). A negative result must be combined with clinical observations, patient history, and epidemiological information.  Fact Sheet for Patients:   BoilerBrush.com.cy  Fact Sheet for Healthcare Providers: https://pope.com/  This test is not yet approved or  cleared by the [place] FDA and has been authorized for detection and/or diagnosis of SARS-CoV-2 by FDA under an Emergency Use Authorization (EUA).  This EUA will remain in effect (meaning this test can be used) for the duration of the COVID-19 declaration under Section 564(b)(1) of the Act, 21 U.S.C. section 360bbb-3(b)(1), unless the authorization is terminated or revoked sooner.  Performed at [facility], [address], [place], [place] [address]   Urine culture     Status: Abnormal   Collection Time: 02/03/20  8:44 PM   Specimen: Urine, Random  Result Value Ref Range Status   Specimen Description URINE, RANDOM  Final   Special Requests NONE  Final   Culture (A)  Final    >=100,000 COLONIES/mL AEROCOCCUS SPECIES Standardized susceptibility testing for this organism is not available. Performed at [facility], [address] [place], [place] [address]    Report Status 02/06/2020 FINAL  Final  Culture, blood (routine x 2)     Status: None (Preliminary result)   Collection Time: 02/03/20  9:38 PM   Specimen: BLOOD  Result Value Ref Range Status   Specimen Description  BLOOD LEFT UPPER ARM  Final   Special Requests   Final    BOTTLES DRAWN AEROBIC AND ANAEROBIC Blood Culture adequate volume   Culture   Final    NO GROWTH 2 DAYS Performed at [facility], [address], [place], [place] [address]    Report Status PENDING  Incomplete         Radiology Studies: EEG  Result Date: 02/04/2020 Charlsie Quest, MD     02/04/2020  4:07 PM Patient Name: Nichole [name] MRN: [id number] Epilepsy Attending: Charlsie Quest Referring Physician/Provider: Dr Valente David Date: 02/04/2020 Duration: 26.57 mins Patient history: 64 year old female with a history of heart disease requiring defibrillator as well as multiple previous strokes and atrial fibrillation who presents with what sounds like new onset seizure.  EEG to evaluate for seizure Level of alertness: Awake,asleep AEDs during EEG study: Keppra Technical aspects: This EEG study was done with scalp electrodes positioned according to the 10-20 International system of electrode placement. Electrical activity was acquired at a sampling rate of 500Hz  and reviewed with a high frequency filter of 70Hz  and a low frequency filter of 1Hz . EEG data were recorded continuously and digitally stored. Description: The posterior dominant rhythm consists of 9 Hz activity of moderate voltage (25-35 uV) seen predominantly in posterior head regions, symmetric and reactive to eye opening and eye closing. Sleep was characterized by vertex waves, sleep spindles (12 to 14 Hz), maximal frontocentral region. Hyperventilation and photic stimulation were not performed.   IMPRESSION: This study  is within normal limits. No seizures or epileptiform discharges were seen throughout the recording. Charlsie Quest   ECHOCARDIOGRAM COMPLETE  Result Date: 02/05/2020    ECHOCARDIOGRAM REPORT   Patient Name:   Nichole [name] Date of Exam: 02/05/2020 Medical Rec #:  [id number]     Height:       60.0 in Accession #:    [id number]    Weight:       160.0 lb Date  of Birth:  March 11, 1956      BSA:          1.698 m Patient Age:    64 years      BP:           128/67 mmHg Patient Gender: F             HR:           70 bpm. Exam Location:  Inpatient Procedure: 2D Echo Indications:    acute systolic CHF 428.21  History:        Patient has no prior history of Echocardiogram examinations.                 CHF, CAD, Defibrillator, Arrythmias:Atrial Fibrillation; Risk                 Factors:Dyslipidemia and Hypertension.  Sonographer:    Delcie Roch Referring Phys: [id number] Delsa Walder J Nassim Cosma IMPRESSIONS  1. Left ventricular ejection fraction, by estimation, is 35 to 40%. The left ventricle has moderately decreased function. The left ventricle demonstrates global hypokinesis. Left ventricular diastolic parameters are consistent with Grade I diastolic dysfunction (impaired relaxation).  2. Right ventricular systolic function is mildly reduced. The right ventricular size is mildly enlarged. There is normal pulmonary artery systolic pressure. The estimated right ventricular systolic pressure is 25.1 mmHg.  3. The mitral valve is grossly normal. Mild mitral valve regurgitation. No evidence of mitral stenosis.  4. The aortic valve is tricuspid. Aortic valve regurgitation is not visualized. No aortic stenosis is present.  5. The inferior vena cava is normal in size with greater than 50% respiratory variability, suggesting right atrial pressure of 3 mmHg. Comparison(s): No significant change from prior study. FINDINGS  Left Ventricle: Left ventricular ejection fraction, by estimation, is 35 to 40%. The left ventricle has moderately decreased function. The left ventricle demonstrates global hypokinesis. The left ventricular internal cavity size was normal in size. There is no left ventricular hypertrophy. Left ventricular diastolic parameters are consistent with Grade I diastolic dysfunction (impaired relaxation). Normal left ventricular filling pressure. Right Ventricle: The right ventricular  size is mildly enlarged. No increase in right ventricular wall thickness. Right ventricular systolic function is mildly reduced. There is normal pulmonary artery systolic pressure. The tricuspid regurgitant velocity  is 2.35 m/s, and with an assumed right atrial pressure of 3 mmHg, the estimated right ventricular systolic pressure is 25.1 mmHg. Left Atrium: Left atrial size was normal in size. Right Atrium: Right atrial size was normal in size. Pericardium: Trivial pericardial effusion is present. Mitral Valve: The mitral valve is grossly normal. Mild mitral valve regurgitation. No evidence of mitral valve stenosis. Tricuspid Valve: The tricuspid valve is grossly normal. Tricuspid valve regurgitation is trivial. No evidence of tricuspid stenosis. Aortic Valve: The aortic valve is tricuspid. Aortic valve regurgitation is not visualized. No aortic stenosis is present. Pulmonic Valve: The pulmonic valve was grossly normal. Pulmonic valve regurgitation is not visualized. No evidence of pulmonic stenosis. Aorta: The aortic root and ascending aorta are  structurally normal, with no evidence of dilitation. Venous: The inferior vena cava is normal in size with greater than 50% respiratory variability, suggesting right atrial pressure of 3 mmHg. IAS/Shunts: The atrial septum is grossly normal. Additional Comments: A pacer wire is visualized in the right atrium and right ventricle.  LEFT VENTRICLE PLAX 2D LVIDd:         5.20 cm     Diastology LVIDs:         4.60 cm     LV e' lateral:   6.42 cm/s LV PW:         1.30 cm     LV E/e' lateral: 8.0 LV IVS:        0.80 cm     LV e' medial:    4.03 cm/s LVOT diam:     2.00 cm     LV E/e' medial:  12.8 LVOT Area:     3.14 cm  LV Volumes (MOD) LV vol d, MOD A2C: 67.5 ml LV vol d, MOD A4C: 70.5 ml LV vol s, MOD A2C: 42.5 ml LV vol s, MOD A4C: 41.7 ml LV SV MOD A2C:     25.0 ml LV SV MOD A4C:     70.5 ml LV SV MOD BP:      26.0 ml RIGHT VENTRICLE            IVC RV S prime:     9.46 cm/s   IVC diam: 1.10 cm TAPSE (M-mode): 1.8 cm LEFT ATRIUM             Index       RIGHT ATRIUM           Index LA diam:        3.80 cm 2.24 cm/m  RA Area:     12.80 cm LA Vol (A2C):   56.0 ml 32.99 ml/m RA Volume:   30.60 ml  18.02 ml/m LA Vol (A4C):   47.0 ml 27.68 ml/m LA Biplane Vol: 55.5 ml 32.69 ml/m   AORTA Ao Root diam: 3.00 cm Ao Asc diam:  3.10 cm MITRAL VALVE               TRICUSPID VALVE MV Area (PHT): 3.91 cm    TR Peak grad:   22.1 mmHg MV Decel Time: 194 msec    TR Vmax:        235.00 cm/s MV E velocity: 51.40 cm/s MV A velocity: 63.40 cm/s  SHUNTS MV E/A ratio:  0.81        Systemic Diam: 2.00 cm Lennie Odor MD Electronically signed by Lennie Odor MD Signature Date/Time: 02/05/2020/3:00:55 PM    Final         Scheduled Meds: . carvedilol  3.125 mg Oral BID WC  . enoxaparin (LOVENOX) injection  40 mg Subcutaneous Q24H  . hydrALAZINE  100 mg Oral TID  . insulin aspart  0-15 Units Subcutaneous Q4H  . insulin aspart  8 Units Subcutaneous TID WC  . insulin glargine  10 Units Subcutaneous Daily  . levETIRAcetam  500 mg Oral BID  . levothyroxine  150 mcg Oral Q0600  . mirabegron ER  25 mg Oral Daily  . ramipril  5 mg Oral q morning - 10a  . [START ON 02/09/2020] Vitamin D (Ergocalciferol)  50,000 Units Oral Q Fri-1800  . Warfarin - Pharmacist Dosing Inpatient   Does not apply q1600   Continuous Infusions: . sodium chloride 100 mL/hr at 02/05/20 2239  LOS: 3 days    Time spent:40 min    Jinna Weinman, Roselind Messier, MD [facility] Pager [phone]  If 7PM-7AM, please contact night-coverage [email or web address] Password [email or web address] 02/06/2020, 10:14 AM

## 2020-02-06 NOTE — Significant Event (Signed)
RN called to [facility] [phone]) to set up appointment due to shift report that an appointment has not been made.   Per receptionist at [facility] that an appointment had already been set up yesterday. The follow up appointment with [facility] will be on 03/29/2020 at 09:30 with Dr. Marjory Lies. Will make sure patient and family receive this information as well.

## 2020-02-06 NOTE — Progress Notes (Signed)
Hypoglycemic Event  CBG: 67 @ 20:48  Treatment:8 oz of juice Symptoms: none; routine check Follow-up CBG: Time:21:10 CBG Result:86  Possible Reasons for Event: unknown Comments/MD notified: yes   Nichole [name]

## 2020-02-06 NOTE — TOC Initial Note (Signed)
Transition of Care (TOC) - Initial/Assessment Note  Donn Pierini RN,BSN Transitions of Care Unit 4NP (non trauma) - RN Case Manager See Treatment Team for direct Phone #   Patient Details  Name: Sakara Lehtinen MRN: [id number] Date of Birth: 1956/04/17  Transition of Care [facility]) CM/SW Contact:    Darrold Span, RN Phone Number: 02/06/2020, 12:48 PM  Clinical Narrative:                 Pt admitted from home, per MD needs f/u appointment for INR check, spoke with pt at bedside to confirm PCP- per pt she is followed by Dr. Nelle Don in [place], pt reports she has an upcoming appointment on Monday Aug. 2- not sure what time but states her daughter would know. Call made to daughter Vikki Ports to discuss PCP needs- per Vikki Ports- appointment confirmed for Aug 2 at 2pm with Dr. Allyson Sabal. Daughter also states she is in the process of relocating her mother to here to be close to her. Discussed need to locate primary care here soon in order to have someone to follow her INR checks and coumadin needs. Info provided to daughter for [facility] along with [employer] for assistance in finding local providers for patient.   Call made to Dr. Hazle Coca office at [facility] [phone]) in [place] to made sure they can add INR check to appointment on Monday- spoke with Elnita Maxwell at the PCP office - and she will note it in patients chart that INR check needs to be done at that appointment- fax # provided so that D/C summary can be faxed to office so that they know what dose pt was sent home one- goal range was provided to [facility] over phone. Fax # for Dr. Allyson Sabal office- [phone] attn: Elnita Maxwell   Expected Discharge Plan: Home/Self Care Barriers to Discharge: No Barriers Identified   Patient Goals and CMS Choice Patient states their goals for this hospitalization and ongoing recovery are:: return home   Choice offered to / list presented to : NA  Expected Discharge Plan and  Services Expected Discharge Plan: Home/Self Care   Discharge Planning Services: CM Consult, Follow-up appt scheduled   Living arrangements for the past 2 months: Apartment                                      Prior Living Arrangements/Services Living arrangements for the past 2 months: Apartment Lives with:: Self Patient language and need for interpreter reviewed:: Yes Do you feel safe going back to the place where you live?: Yes      Need for Family Participation in Patient Care: Yes (Comment) Care giver support system in place?: Yes (comment) Current home services: DME Criminal Activity/Legal Involvement Pertinent to Current Situation/Hospitalization: No - Comment as needed  Activities of Daily Living      Permission Sought/Granted Permission sought to share information with : Oceanographer granted to share information with : Yes, Verbal Permission Granted  Share Information with NAME: Donzetta Kohut  Permission granted to share info w AGENCY: PCP office  Permission granted to share info w Relationship: daughter     Emotional Assessment Appearance:: Appears stated age Attitude/Demeanor/Rapport: Engaged Affect (typically observed): Appropriate, Pleasant Orientation: : Oriented to Self, Oriented to Place, Oriented to  Time, Oriented to Situation Alcohol / Substance Use: Not Applicable Psych Involvement: No (comment)  Admission diagnosis:  Seizure (HCC) [email or web address]]  Patient Active Problem List   Diagnosis Date Noted   Unspecified atrial fibrillation (HCC) 02/04/2020   History of cardioembolic cerebrovascular accident (CVA) 02/04/2020   Chronic systolic CHF (congestive heart failure) (HCC) 02/04/2020   CAD (coronary artery disease) 02/04/2020   Essential hypertension 02/04/2020   Dyslipidemia 02/04/2020   Hypothyroidism 02/04/2020   Seizure (HCC) 02/03/2020   PCP:  Patient, No Pcp Per Pharmacy:   [facility] [address], [place] - [address] [facility] [place] [address] Phone: [phone] Fax: [phone]  [facility] [address], [place] - [address] [place] [address] Phone: [phone] Fax: [phone]     Social Determinants of Health (SDOH) Interventions    Readmission Risk Interventions Readmission Risk Prevention Plan 02/06/2020  Post Dischage Appt Complete  Medication Screening Complete  Transportation Screening Complete

## 2020-02-06 NOTE — Progress Notes (Signed)
Inpatient Diabetes Program Recommendations  AACE/ADA: New Consensus Statement on Inpatient Glycemic Control (2015)  Target Ranges:  Prepandial:   less than 140 mg/dL      Peak postprandial:   less than 180 mg/dL (1-2 hours)      Critically ill patients:  140 - 180 mg/dL   Lab Results  Component Value Date   GLUCAP 188 (H) 02/06/2020   HGBA1C 12.9 (H) 02/04/2020    Review of Glycemic Control Results for CLARIECE, ROESLER (MRN [id number]) as of 02/06/2020 15:25  Ref. Range 02/05/2020 23:52 02/06/2020 00:19 02/06/2020 04:54 02/06/2020 08:05 02/06/2020 12:16  Glucose-Capillary Latest Ref Range: 70 - 99 mg/dL 61 (L) 85 619 (H) 509 (H) 188 (H)   Diabetes history:  DM2 Outpatient Diabetes medications:  Amaryl 4 mg daily Metformin 1000 mg bid  Lantus 30 daily Current orders for Inpatient glycemic control:  Novolog 0-15 units tid  Novolog 8 units TID Lantus 8 units daily  Note:  Attempted to speak with pt about DM management and A1C of 12.9%.  Her husband passed on 07/21/24and he managed her DM.  She was without insulin for > 2weeks.  SHe is now staying with her daughter Vikki Ports and she administers her medications.  Called Clarissa and confirmed above home medications.  Reviewed patient's current A1c of 12.9%. Explained what a A1c is and what it measures. Also reviewed goal A1c with patient, importance of good glucose control @ home, and blood sugar goals.  Reviewed CHO's, foods that contain CHO's and goal CHO's per meal of 50 grams.  She checks her BS qam and pm. DTR states her blood sugars have come back down to 100's since they started her Lantus back.  Will order LWWD booklet and attach education to AVS.  Will continue to follow while inpatient.  Thank you, Dulce Sellar, RN, BSN Diabetes Coordinator Inpatient Diabetes Program [phone] (team pager from 8a-5p)

## 2020-02-07 LAB — CBC WITH DIFFERENTIAL/PLATELET
Abs Immature Granulocytes: 0.04 10*3/uL (ref 0.00–0.07)
Basophils Absolute: 0.1 10*3/uL (ref 0.0–0.1)
Basophils Relative: 1 %
Eosinophils Absolute: 0.3 10*3/uL (ref 0.0–0.5)
Eosinophils Relative: 4 %
HCT: 36 % (ref 36.0–46.0)
Hemoglobin: 11.2 g/dL — ABNORMAL LOW (ref 12.0–15.0)
Immature Granulocytes: 1 %
Lymphocytes Relative: 31 %
Lymphs Abs: 2.4 10*3/uL (ref 0.7–4.0)
MCH: 28.8 pg (ref 26.0–34.0)
MCHC: 31.1 g/dL (ref 30.0–36.0)
MCV: 92.5 fL (ref 80.0–100.0)
Monocytes Absolute: 0.5 10*3/uL (ref 0.1–1.0)
Monocytes Relative: 7 %
Neutro Abs: 4.3 10*3/uL (ref 1.7–7.7)
Neutrophils Relative %: 56 %
Platelets: 259 10*3/uL (ref 150–400)
RBC: 3.89 MIL/uL (ref 3.87–5.11)
RDW: 13.3 % (ref 11.5–15.5)
WBC: 7.6 10*3/uL (ref 4.0–10.5)
nRBC: 0 % (ref 0.0–0.2)

## 2020-02-07 LAB — COMPREHENSIVE METABOLIC PANEL
ALT: 19 U/L (ref 0–44)
AST: 27 U/L (ref 15–41)
Albumin: 2.9 g/dL — ABNORMAL LOW (ref 3.5–5.0)
Alkaline Phosphatase: 46 U/L (ref 38–126)
Anion gap: 7 (ref 5–15)
BUN: 9 mg/dL (ref 8–23)
CO2: 21 mmol/L — ABNORMAL LOW (ref 22–32)
Calcium: 9 mg/dL (ref 8.9–10.3)
Chloride: 110 mmol/L (ref 98–111)
Creatinine, Ser: 0.82 mg/dL (ref 0.44–1.00)
GFR calc Af Amer: >60 mL/min (ref >60)
GFR calc non Af Amer: >60 mL/min (ref >60)
Glucose, Bld: 202 mg/dL — ABNORMAL HIGH (ref 70–99)
Potassium: 4.3 mmol/L (ref 3.5–5.1)
Sodium: 138 mmol/L (ref 135–145)
Total Bilirubin: 0.4 mg/dL (ref 0.3–1.2)
Total Protein: 5.9 g/dL — ABNORMAL LOW (ref 6.5–8.1)

## 2020-02-07 LAB — GLUCOSE, CAPILLARY
Glucose-Capillary: 111 mg/dL — ABNORMAL HIGH (ref 70–99)
Glucose-Capillary: 138 mg/dL — ABNORMAL HIGH (ref 70–99)
Glucose-Capillary: 141 mg/dL — ABNORMAL HIGH (ref 70–99)
Glucose-Capillary: 170 mg/dL — ABNORMAL HIGH (ref 70–99)
Glucose-Capillary: 184 mg/dL — ABNORMAL HIGH (ref 70–99)
Glucose-Capillary: 191 mg/dL — ABNORMAL HIGH (ref 70–99)
Glucose-Capillary: 44 mg/dL — CL (ref 70–99)
Glucose-Capillary: 67 mg/dL — ABNORMAL LOW (ref 70–99)

## 2020-02-07 LAB — PHOSPHORUS: Phosphorus: 2.8 mg/dL (ref 2.5–4.6)

## 2020-02-07 LAB — PROTIME-INR
INR: 2.1 — ABNORMAL HIGH (ref 0.8–1.2)
Prothrombin Time: 22.7 seconds — ABNORMAL HIGH (ref 11.4–15.2)

## 2020-02-07 LAB — MAGNESIUM: Magnesium: 1.5 mg/dL — ABNORMAL LOW (ref 1.7–2.4)

## 2020-02-07 MED ORDER — ATORVASTATIN CALCIUM 20 MG PO TABS: 20.0000 mg | ORAL_TABLET | Freq: Every day | ORAL | 0 refills | Status: DC

## 2020-02-07 MED ORDER — ATORVASTATIN CALCIUM 10 MG PO TABS: 20.0000 mg | ORAL_TABLET | Freq: Every day | ORAL | Status: DC

## 2020-02-07 MED ORDER — DEXTROSE 50 % IV SOLN
INTRAVENOUS | Status: AC
  Administered 2020-02-07: 25 mL
  Filled 2020-02-07: qty 50

## 2020-02-07 MED ORDER — LEVETIRACETAM ER 500 MG PO TB24: 500.0000 mg | ORAL_TABLET | Freq: Two times a day (BID) | ORAL | 0 refills | Status: DC

## 2020-02-07 MED ORDER — FUROSEMIDE 40 MG PO TABS: 40.0000 mg | ORAL_TABLET | Freq: Every day | ORAL | Status: DC

## 2020-02-07 NOTE — Evaluation (Signed)
Occupational Therapy Evaluation Patient Details Name: Nichole [name] MRN: [id number] DOB: 08/07/1955 Today's Date: 02/07/2020    History of Present Illness 64 yo female who was presented to the hospital for seizure like s/s. EEG WFL. Pt has past medical history of asthma, chronic paroxysmal atrial fibrillation, systolic heart failure, coronary artery disease, hypertension, dyslipidemia and history of CVA   Clinical Impression   PTA pt living with daughter (plans to move in with son) and functioning at mod I level. Pt reports receiving as needed assist for IADLs at baseline. At time of eval, pt presents with ability to complete sit <> stands at min guard level with RW. Pt is unsteady on her feet without external support. She currently requires min A to complete LB BADLs. Educated pt on purpose of shower chair/bench for use at home.  Noted cognitive deficits in problem solving and following commands. Pt has some delayed responses/processing, which may be post ictal. Given current status, recommend HHOT with supervision to support safety, BADL engagement, independent PLOF. OT will continue to follow per POC listed below.    Follow Up Recommendations  Home health OT;Supervision - Intermittent    Equipment Recommendations  3 in 1 bedside commode;Tub/shower bench    Recommendations for Other Services       Precautions / Restrictions Precautions Precautions: Fall Restrictions Weight Bearing Restrictions: No      Mobility Bed Mobility               General bed mobility comments: pt in recliner, ended session in recliner  Transfers Overall transfer level: Needs assistance Equipment used: Rolling walker (2 wheeled) Transfers: Sit to/from Stand Sit to Stand: Min guard         General transfer comment: min guard assist for safety    Balance Overall balance assessment: Needs assistance Sitting-balance support: No upper extremity supported;Feet supported Sitting balance-Leahy  Scale: Good Sitting balance - Comments: pt set edge of chair without UE support   Standing balance support: No upper extremity supported;During functional activity Standing balance-Leahy Scale: Fair Standing balance comment: pt able to maintain static standing balance with feet together for 30 seconds without LOB     Tandem Stance - Right Leg: 20 (LOB x 1, required min assist for stability)                     ADL either performed or assessed with clinical judgement   ADL Overall ADL's : Needs assistance/impaired Eating/Feeding: Set up;Sitting   Grooming: Min guard;Standing   Upper Body Bathing: Set up;Sitting   Lower Body Bathing: Minimal assistance;Sit to/from stand;Sitting/lateral leans   Upper Body Dressing : Set up;Sitting   Lower Body Dressing: Minimal assistance;Sit to/from stand;Sitting/lateral leans Lower Body Dressing Details (indicate cue type and reason): increased assist for steadying support in standing Toilet Transfer: Minimal assistance;Ambulation;RW;Regular Toilet;Grab bars   Toileting- Clothing Manipulation and Hygiene: Set up;Sitting/lateral lean;Sit to/from stand   Tub/ Shower Transfer: Minimal assistance;Tub bench;Ambulation;Rolling walker   Functional mobility during ADLs: Min guard;Rolling walker;Cueing for safety       Vision Patient Visual Report: No change from baseline       Perception     Praxis      Pertinent Vitals/Pain Pain Assessment: No/denies pain     Hand Dominance     Extremity/Trunk Assessment Upper Extremity Assessment Upper Extremity Assessment: Overall WFL for tasks assessed   Lower Extremity Assessment Lower Extremity Assessment: Defer to PT evaluation   Cervical / Trunk Assessment Cervical /  Trunk Assessment: Kyphotic   Communication Communication Communication: No difficulties   Cognition Arousal/Alertness: Awake/alert Behavior During Therapy: WFL for tasks assessed/performed Overall Cognitive Status:  No family/caregiver present to determine baseline cognitive functioning Area of Impairment: Following commands;Problem solving                       Following Commands: Follows multi-step commands with increased time     Problem Solving: Slow processing;Decreased initiation General Comments: slow to process and respond via conversation   General Comments  DOE 2-3/4 with mobility, VSS On RA    Exercises     Shoulder Instructions      Home Living Family/patient expects to be discharged to:: Private residence Living Arrangements: Children Available Help at Discharge: Family Type of Home: Apartment Home Access: Stairs to enter Secretary/administrator of Steps: 10 Entrance Stairs-Rails: Right Home Layout: One level     Bathroom Shower/Tub: Chief Strategy Officer: Standard Bathroom Accessibility: Yes   Home Equipment: Environmental consultant - 2 wheels;Cane - single point   Additional Comments: Pt recently had husband pass away, lives with her daughter now and states that she will be "moving in with her son down in [place] this weekend"      Prior Functioning/Environment Level of Independence: Needs assistance  Gait / Transfers Assistance Needed: reports independence without device ADL's / Homemaking Assistance Needed: needs assistance with cooking and cleaning, reports independence with bathing and dressing            OT Problem List: Decreased strength;Decreased knowledge of use of DME or AE;Decreased activity tolerance;Decreased cognition;Impaired balance (sitting and/or standing)      OT Treatment/Interventions: Self-care/ADL training;Therapeutic exercise;Patient/family education;Balance training;Energy conservation;Therapeutic activities;DME and/or AE instruction    OT Goals(Current goals can be found in the care plan section) Acute Rehab OT Goals Patient Stated Goal: not be a burden to family OT Goal Formulation: With patient Time For Goal Achievement:  02/21/20 Potential to Achieve Goals: Good  OT Frequency: Min 2X/week   Barriers to D/C:            Co-evaluation              AM-PAC OT "6 Clicks" Daily Activity     Outcome Measure Help from another person eating meals?: A Little Help from another person taking care of personal grooming?: A Little Help from another person toileting, which includes using toliet, bedpan, or urinal?: A Little Help from another person bathing (including washing, rinsing, drying)?: A Lot Help from another person to put on and taking off regular upper body clothing?: A Little Help from another person to put on and taking off regular lower body clothing?: A Lot 6 Click Score: 16   End of Session Equipment Utilized During Treatment: Gait belt;Rolling walker Nurse Communication: Mobility status  Activity Tolerance: Patient tolerated treatment well Patient left: in chair;with call bell/phone within reach  OT Visit Diagnosis: Unsteadiness on feet (R26.81);Other abnormalities of gait and mobility (R26.89);Other symptoms and signs involving the nervous system (R29.898)                Time: 7915-0569 OT Time Calculation (min): 14 min Charges:  OT General Charges $OT Visit: 1 Visit OT Evaluation $OT Eval Moderate Complexity: 1 Mod  Dalphine Handing, MSOT, OTR/L Acute Rehabilitation Services [facility] Office Number: [phone] Pager: [phone]  Dalphine Handing 02/07/2020, 12:17 PM

## 2020-02-07 NOTE — Progress Notes (Signed)
Hypoglycemic Event  CBG: 67  Treatment: 8 oz of apple juice; 3 graham crackers with peanut butter Symptoms: routine check  Follow-up CBG: Time:00:41 CBG Result:44 (25mg  dextrose given at this time)  Possible Reasons for Event:unknown Comments/MD notified: yes    Fujiko Picazo L

## 2020-02-07 NOTE — TOC Transition Note (Addendum)
Transition of Care [facility]) - CM/SW Discharge Note   Patient Details  Name: Nichole [name] MRN: [id number] Date of Birth: [birth year]  Transition of Care [facility]) CM/SW Contact:  Leone Haven, RN Phone Number: 02/07/2020, 3:13 PM   Clinical Narrative:    NCM  Offered choice, for HHPT, HHOT she states she has no preference for agency,  NCM made referral to [facility]  With Tiffany.  She is able to take referral.  Soc will begin 24 to 48 hrs post dc. Referral made to [facility] for 3 n 1, he will bring up to patient's room prior to dc. [place] office will be seeing patient , their phone is [phone].   Final next level of care: Home w Home Health Services Barriers to Discharge: No Barriers Identified   Patient Goals and CMS Choice Patient states their goals for this hospitalization and ongoing recovery are:: get better CMS Medicare.gov Compare Post Acute Care list provided to:: Patient Choice offered to / list presented to : Patient  Discharge Placement                       Discharge Plan and Services   Discharge Planning Services: CM Consult, Follow-up appt scheduled                      HH Arranged: PT, OT HH Agency: [facility] Date HH Agency Contacted: 02/07/20 Time HH Agency Contacted: 1513 Representative spoke with at [facility] Agency: Tiffany  Social Determinants of Health (SDOH) Interventions     Readmission Risk Interventions Readmission Risk Prevention Plan 02/06/2020  Post Dischage Appt Complete  Medication Screening Complete  Transportation Screening Complete

## 2020-02-07 NOTE — Care Management Important Message (Signed)
Important Message  Patient Details  Name: Nichole [name] MRN: [id number] Date of Birth: 08-24-1955   Medicare Important Message Given:  Yes     Dorena Bodo 02/07/2020, 3:08 PM

## 2020-02-07 NOTE — Progress Notes (Signed)
Inpatient Diabetes Program Recommendations  AACE/ADA: New Consensus Statement on Inpatient Glycemic Control (2015)  Target Ranges:  Prepandial:   less than 140 mg/dL      Peak postprandial:   less than 180 mg/dL (1-2 hours)      Critically ill patients:  140 - 180 mg/dL   Results for Nichole [name], Nichole [name] (MRN [id number]) as of 02/07/2020 10:09  Ref. Range 02/06/2020 00:19 02/06/2020 04:54 02/06/2020 08:05 02/06/2020 12:16 02/06/2020 16:19 02/06/2020 20:48 02/06/2020 21:10  Glucose-Capillary Latest Ref Range: 70 - 99 mg/dL 85 527 (H)  3 units NOVOLOG  171 (H)  3 units NOVOLOG +  10 units LANTUS  188 (H)  11 units NOVOLOG  179 (H)  11 units NOVOLOG  67 (L) 86   Results for Nichole [name], Nichole [name] (MRN [id number]) as of 02/07/2020 10:09  Ref. Range 02/07/2020 00:10 02/07/2020 00:41 02/07/2020 00:59 02/07/2020 01:58 02/07/2020 03:37 02/07/2020 07:49  Glucose-Capillary Latest Ref Range: 70 - 99 mg/dL 67 (L) 44 (LL) 144 (H) 138 (H) 170 (H)  3 units NOVOLOG  191 (H)  11 units NOVOLOG +  10 units LANTUS    Home DM Meds: Lantus 30 units BID (daughter of pt states Lantus 30 units Daily)       Metformin 1000 mg BID       Amaryl 4 mg Daily  Current Orders: Lantus 10 units Daily      Novolog Moderate Correction Scale/ SSI (0-15 units) TID Q4 hours       Novolog 8 units TID with meals      MD- Note patient with Hypoglycemia yesterday at 8pm and again at Midnight last night.  I suspect the Novolog insulin given at 5pm may have been the cause of the Hypoglycemia.  Please consider:  1. Change timing of the Novolog SSI to TID ac + hs (currently ordered Q4 hours and pt is eating)  2. Reduce Novolog Meal Coverage to 5 units TID with meals     --Will follow patient during hospitalization--  Ambrose Finland RN, MSN, CDE Diabetes Coordinator Inpatient Glycemic Control Team Team Pager: [phone] (8a-5p)

## 2020-02-07 NOTE — Discharge Summary (Addendum)
Physician Discharge Summary  Nichole [name] WUJ:[id number] DOB: [birth year] DOA: 02/03/2020  PCP: Patient, No Pcp Per  Admit date: 02/03/2020 Discharge date: 02/07/2020  Admitted From: Home  Disposition:  Home   Recommendations for Outpatient Follow-up and new medication changes:  1. Follow up with primary care in 7 days.  2. Patient has been placed on Keppra for seizure prophylaxis. 3. Seizure precautions.  4. Follow up with Neurology [facility] on 09.17.21). 5.  Follow up with Primary Care in 7 days.  6. Started on low dose statin.   Home Health: no   Equipment/Devices: no     Discharge Condition: stable  CODE STATUS: full  Diet recommendation: heart healthy and diabetic prudent.   Brief/Interim Summary: Patient was admitted to the hospital with a working diagnosis of new onset seizures.  64 year old female with past medical history of asthma, chronic paroxysmal atrial fibrillation, systolic heart failure, coronary artery disease, hypertension, dyslipidemia and history of CVA who presented with suspected seizure activity.   Patient was witnessed to have generalized tonic-clonic movements associated with loss of consciousness.  Apparently she had a recent hospitalization for sepsis related to urinary tract infection, then she required invasive mechanical ventilation.  After discharge she has been noncompliant with her medications. On her initial physical examination blood pressure 146/89, heart rate 63, temperature 98.3, respiratory rate 23, oxygen saturation 95%.  Patient was awake and alert, neurologically nonfocal, her lungs were clear to auscultation bilaterally, heart S1-S2, present rhythm, soft abdomen, no lower extremity edema. Sodium 141, potassium 3.9, chloride 108, bicarb 22, glucose 178, BUN 24, creatinine 0.99, white count 15.7, hemoglobin 13.1, hematocrit 42.4, platelets 299.  TSH 12.4.  INR 1.2.  SARS COVID-19 negative.  Urinalysis negative for infection.    Head CT no acute changes, advanced atrophy, multiple lacunar infarcts involving bilateral basal ganglia, chronic.  Old infarct posterior inferior right cerebellum.  Chest radiograph with no infiltrates.  EKG 60 bpm, rightward axis, sinus rhythm, poor R wave progression, Q waves in V3 through V6, positive PVC, no ST segment or T wave changes.  1.  New onset seizures, in the setting of history of CVA.  Patient was admitted to the medical ward, she was placed on a telemetry monitor, she had frequent neuro checks and was loaded with Keppra. Patient was seen by neurology, underwent further work-up with electroencephalography which resulted in a normal study with no active seizure activity. Neurology was consulted with recommendations to continue Keppra 500 g twice daily, outpatient follow-up with neurology and seizure precautions.  Patient will continue blood pressure control, antiplatelet therapy and aspirin for her history of CVA.  Patient will be seen by physical therapy/occupational therapy before discharge.  Home health services will be arranged.  2.  Chronic systolic heart failure/coronary artery disease/ HTN.  No signs of acute exacerbation. Further work-up with echocardiography revealed left ventricle ejection fraction 35 to 40%, with global hypokinesis, right ventricular systolic function mildly reduced, no significant valvular disease.  Continue heart failure management and blood pressure control with ramipril, carvedilol and hydralazine. Diuresis with furosemide.  3.  Chronic paroxysmal atrial fibrillation/supratherapeutic INR.  Remained sinus rhythm during her hospitalization, close follow-up of INR.  At discharge INR 2.1, follow-up as an outpatient.  Continue rate control with carvedilol.   4.  Hypothyroidism.  TSH was elevated 12.2.  Apparently patient has been noncompliant with her medications at home, will keep levothyroxine at current dose and recommend outpatient follow-up.  Currently  patient has no frank symptoms of  hypothyroidism.  5.  Uncontrolled type 2 diabetes mellitus, hemoglobin A1c 12.9, dyslipidemia.  Patient was placed on basal insulin along with insulin sliding scale for glucose control during her hospitalization.  At discharge patient will resume Metformin, glimepiride and basal insulin with glargine, she will need close follow-up as an outpatient.  LDL 120, will add low dose statin and further adjustments as outpatient.   6. Hypomagnesemia. Magnesium was corrected with mag sulfate.   I spoke over the phone with the patient's daughter about patient's  condition, plan of care, prognosis and all questions were addressed.  Discharge Diagnoses:  Principal Problem:   Seizure [facility]) Active Problems:   Unspecified atrial fibrillation (HCC)   History of cardioembolic cerebrovascular accident (CVA)   Chronic systolic CHF (congestive heart failure) (HCC)   CAD (coronary artery disease)   Essential hypertension   Dyslipidemia   Hypothyroidism    Discharge Instructions   Allergies as of 02/07/2020   No Known Allergies     Medication List    TAKE these medications   atorvastatin 20 MG tablet Commonly known as: LIPITOR Take 1 tablet (20 mg total) by mouth daily.   carvedilol 12.5 MG tablet Commonly known as: COREG Take 12.5 mg by mouth in the morning and at bedtime.   furosemide 40 MG tablet Commonly known as: LASIX Take 40 mg by mouth daily.   glimepiride 4 MG tablet Commonly known as: AMARYL Take 4 mg by mouth daily with breakfast.   hydrALAZINE 100 MG tablet Commonly known as: APRESOLINE Take 100 mg by mouth 3 (three) times daily.   Lantus SoloStar 100 UNIT/ML Solostar Pen Generic drug: insulin glargine Inject 30 Units into the skin 2 (two) times daily.   levETIRAcetam 500 MG 24 hr tablet Commonly known as: KEPPRA XR Take 1 tablet (500 mg total) by mouth 2 (two) times daily.   levothyroxine 125 MCG tablet Commonly known as:  SYNTHROID Take 125 mcg by mouth every morning.   metFORMIN 1000 MG tablet Commonly known as: GLUCOPHAGE Take 1,000 mg by mouth 2 (two) times daily.   Myrbetriq 25 MG Tb24 tablet Generic drug: mirabegron ER Take 25 mg by mouth daily.   ramipril 5 MG capsule Commonly known as: ALTACE Take 5 mg by mouth daily.   Vitamin D3 1.25 MG (50000 UT) Caps Take 50,000 Units by mouth every Friday.   warfarin 6 MG tablet Commonly known as: COUMADIN Take 6 mg by mouth every evening.       Follow-up Information    [facility]. Schedule an appointment as soon as possible for a visit in 6 week(s).   Why: Schedule appointment for follow-up seizure in 6 weeks first available neurologist at [facility] Contact information: [address]     [address] [place] [address] [phone]       [facility] .   Contact information: [address] [place] [address] [phone]       Parke Poisson, MD. Nyra Capes on 03/07/2023.   Specialties: Cardiology, Radiology Why: 8/25 establish care doctor [facility] @0  900   Contact information: [address] [place] [phone]        [phone]. Go on 02/12/2020.   Why: Keep scheduled appointment at 2pm- spoke with 04/13/2020 at the MD office who will make note for office to check INR at this appointment Contact information: [facility] [phone] fax [phone]  No Known Allergies  Consultations:  Neurology    Procedures/Studies: EEG  Result Date: 02/04/2020 Charlsie QuestYadav, Priyanka O, MD     02/04/2020  4:07 PM Patient Name: Nichole [name] MRN: [id number] Epilepsy Attending: Charlsie QuestPriyanka O Yadav Referring Physician/Provider: Dr Valente DavidJan Mansy Date: 02/04/2020 Duration: 26.57 mins Patient history: 64 year old female with a history of heart disease requiring defibrillator as well as multiple  previous strokes and atrial fibrillation who presents with what sounds like new onset seizure.  EEG to evaluate for seizure Level of alertness: Awake,asleep AEDs during EEG study: Keppra Technical aspects: This EEG study was done with scalp electrodes positioned according to the 10-20 International system of electrode placement. Electrical activity was acquired at a sampling rate of 500Hz  and reviewed with a high frequency filter of 70Hz  and a low frequency filter of 1Hz . EEG data were recorded continuously and digitally stored. Description: The posterior dominant rhythm consists of 9 Hz activity of moderate voltage (25-35 uV) seen predominantly in posterior head regions, symmetric and reactive to eye opening and eye closing. Sleep was characterized by vertex waves, sleep spindles (12 to 14 Hz), maximal frontocentral region. Hyperventilation and photic stimulation were not performed.   IMPRESSION: This study is within normal limits. No seizures or epileptiform discharges were seen throughout the recording. Charlsie QuestPriyanka O Yadav   DG Chest 2 View  Result Date: 02/03/2020 CLINICAL DATA:  Seizures EXAM: CHEST - 2 VIEW COMPARISON:  None. FINDINGS: The lungs are clear. Opacification of the left lung bases related to overlying soft tissue. No pneumothorax or pleural effusion. Cardiac size is mildly enlarged. Left subclavian 3 lead pacemaker defibrillator is in place. Pulmonary vascularity is normal. No acute bone abnormality. IMPRESSION: No active cardiopulmonary disease.  Mild cardiomegaly. Electronically Signed   By: Helyn NumbersAshesh  Parikh MD   On: 02/03/2020 19:43   CT HEAD WO CONTRAST  Result Date: 02/03/2020 CLINICAL DATA:  Seizure. EXAM: CT HEAD WITHOUT CONTRAST TECHNIQUE: Contiguous axial images were obtained from the base of the skull through the vertex without intravenous contrast. COMPARISON:  None. FINDINGS: Brain: No evidence of acute infarction, hemorrhage, hydrocephalus, extra-axial collection or mass lesion/mass  effect. Advanced atrophy and microvascular ischemic changes are noted. Multiple lacunar infarcts are noted involving the bilateral basal 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 posteroinferior right cerebellum (sagittal series 6, image 25). Vascular: No hyperdense vessel or unexpected calcification. Skull: Normal. Negative for fracture or focal lesion. Sinuses/Orbits: No acute finding. Other: None. IMPRESSION: 1. No acute intracranial abnormality. 2. Advanced atrophy and microvascular ischemic changes. 3. Multiple lacunar infarcts are noted involving the bilateral basal 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 posteroinferior right cerebellum. Electronically Signed   By: Katherine Mantlehristopher  Green M.D.   On: 02/03/2020 19:37   ECHOCARDIOGRAM COMPLETE  Result Date: 02/05/2020    ECHOCARDIOGRAM REPORT   Patient Name:   Nichole [name] Date of Exam: 02/05/2020 Medical Rec #:  [id number]     Height:       60.0 in Accession #:    [id number]    Weight:       160.0 lb Date of Birth:  03/30/1956      BSA:          1.698 m Patient Age:    64 years      BP:           128/67 mmHg Patient Gender: F             HR:  70 bpm. Exam Location:  Inpatient Procedure: 2D Echo Indications:    acute systolic CHF 428.21  History:        Patient has no prior history of Echocardiogram examinations.                 CHF, CAD, Defibrillator, Arrythmias:Atrial Fibrillation; Risk                 Factors:Dyslipidemia and Hypertension.  Sonographer:    Delcie Roch Referring Phys: [id number] CURTIS J WOODS IMPRESSIONS  1. Left ventricular ejection fraction, by estimation, is 35 to 40%. The left ventricle has moderately decreased function. The left ventricle demonstrates global hypokinesis. Left ventricular diastolic parameters are consistent with Grade I diastolic dysfunction (impaired relaxation).  2. Right ventricular systolic function is mildly reduced. The right ventricular size is mildly  enlarged. There is normal pulmonary artery systolic pressure. The estimated right ventricular systolic pressure is 25.1 mmHg.  3. The mitral valve is grossly normal. Mild mitral valve regurgitation. No evidence of mitral stenosis.  4. The aortic valve is tricuspid. Aortic valve regurgitation is not visualized. No aortic stenosis is present.  5. The inferior vena cava is normal in size with greater than 50% respiratory variability, suggesting right atrial pressure of 3 mmHg. Comparison(s): No significant change from prior study. FINDINGS  Left Ventricle: Left ventricular ejection fraction, by estimation, is 35 to 40%. The left ventricle has moderately decreased function. The left ventricle demonstrates global hypokinesis. The left ventricular internal cavity size was normal in size. There is no left ventricular hypertrophy. Left ventricular diastolic parameters are consistent with Grade I diastolic dysfunction (impaired relaxation). Normal left ventricular filling pressure. Right Ventricle: The right ventricular size is mildly enlarged. No increase in right ventricular wall thickness. Right ventricular systolic function is mildly reduced. There is normal pulmonary artery systolic pressure. The tricuspid regurgitant velocity  is 2.35 m/s, and with an assumed right atrial pressure of 3 mmHg, the estimated right ventricular systolic pressure is 25.1 mmHg. Left Atrium: Left atrial size was normal in size. Right Atrium: Right atrial size was normal in size. Pericardium: Trivial pericardial effusion is present. Mitral Valve: The mitral valve is grossly normal. Mild mitral valve regurgitation. No evidence of mitral valve stenosis. Tricuspid Valve: The tricuspid valve is grossly normal. Tricuspid valve regurgitation is trivial. No evidence of tricuspid stenosis. Aortic Valve: The aortic valve is tricuspid. Aortic valve regurgitation is not visualized. No aortic stenosis is present. Pulmonic Valve: The pulmonic valve was  grossly normal. Pulmonic valve regurgitation is not visualized. No evidence of pulmonic stenosis. Aorta: The aortic root and ascending aorta are structurally normal, with no evidence of dilitation. Venous: The inferior vena cava is normal in size with greater than 50% respiratory variability, suggesting right atrial pressure of 3 mmHg. IAS/Shunts: The atrial septum is grossly normal. Additional Comments: A pacer wire is visualized in the right atrium and right ventricle.  LEFT VENTRICLE PLAX 2D LVIDd:         5.20 cm     Diastology LVIDs:         4.60 cm     LV e' lateral:   6.42 cm/s LV PW:         1.30 cm     LV E/e' lateral: 8.0 LV IVS:        0.80 cm     LV e' medial:    4.03 cm/s LVOT diam:     2.00 cm     LV E/e' medial:  12.8 LVOT Area:     3.14 cm  LV Volumes (MOD) LV vol d, MOD A2C: 67.5 ml LV vol d, MOD A4C: 70.5 ml LV vol s, MOD A2C: 42.5 ml LV vol s, MOD A4C: 41.7 ml LV SV MOD A2C:     25.0 ml LV SV MOD A4C:     70.5 ml LV SV MOD BP:      26.0 ml RIGHT VENTRICLE            IVC RV S prime:     9.46 cm/s  IVC diam: 1.10 cm TAPSE (M-mode): 1.8 cm LEFT ATRIUM             Index       RIGHT ATRIUM           Index LA diam:        3.80 cm 2.24 cm/m  RA Area:     12.80 cm LA Vol (A2C):   56.0 ml 32.99 ml/m RA Volume:   30.60 ml  18.02 ml/m LA Vol (A4C):   47.0 ml 27.68 ml/m LA Biplane Vol: 55.5 ml 32.69 ml/m   AORTA Ao Root diam: 3.00 cm Ao Asc diam:  3.10 cm MITRAL VALVE               TRICUSPID VALVE MV Area (PHT): 3.91 cm    TR Peak grad:   22.1 mmHg MV Decel Time: 194 msec    TR Vmax:        235.00 cm/s MV E velocity: 51.40 cm/s MV A velocity: 63.40 cm/s  SHUNTS MV E/A ratio:  0.81        Systemic Diam: 2.00 cm Lennie Odor MD Electronically signed by Lennie Odor MD Signature Date/Time: 02/05/2020/3:00:55 PM    Final         Subjective: Patient is feeling well, no nausea or vomiting, no dyspnea or chest pain, no further seizures.   Discharge Exam: Vitals:   02/07/20 0018 02/07/20 0750   BP:  (!) 125/94  Pulse: 65 70  Resp: 19 23  Temp:  98.1 F (36.7 C)  SpO2: 94% 93%   Vitals:   02/07/20 0010 02/07/20 0018 02/07/20 0500 02/07/20 0750  BP: 114/75   (!) 125/94  Pulse: 63 65  70  Resp: 22 19  23   Temp: 97.9 F (36.6 C)   98.1 F (36.7 C)  TempSrc: Oral   Oral  SpO2: 93% 94%  93%  Weight:   72.9 kg   Height:        General: Not in pain or dyspnea  Neurology: Awake and alert, non focal  E ENT: no pallor, no icterus, oral mucosa moist Cardiovascular: No JVD. S1-S2 present, rhythmic, no gallops, rubs, or murmurs. No lower extremity edema. Pulmonary: positive breath sounds bilaterally, mild expiratory wheezing, with no rhonchi or rales. Gastrointestinal. Abdomen soft and non tender Skin. No rashes Musculoskeletal: no joint deformities   The results of significant diagnostics from this hospitalization (including imaging, microbiology, ancillary and laboratory) are listed below for reference.     Microbiology: Recent Results (from the past 240 hour(s))  Culture, blood (routine x 2)     Status: None (Preliminary result)   Collection Time: 02/03/20  8:14 PM   Specimen: BLOOD  Result Value Ref Range Status   Specimen Description BLOOD SITE NOT SPECIFIED  Final   Special Requests   Final    BOTTLES DRAWN AEROBIC AND ANAEROBIC Blood Culture results may not be optimal due  to an inadequate volume of blood received in culture bottles   Culture   Final    NO GROWTH 3 DAYS Performed at [facility], [address], [place], [place] [address]    Report Status PENDING  Incomplete  SARS Coronavirus 2 by RT PCR (hospital order, performed in [facility] hospital lab) Nasopharyngeal Nasopharyngeal Swab     Status: None   Collection Time: 02/03/20  8:31 PM   Specimen: Nasopharyngeal Swab  Result Value Ref Range Status   SARS Coronavirus 2 NEGATIVE NEGATIVE Final    Comment: (NOTE) SARS-CoV-2 target nucleic acids are NOT DETECTED.  The SARS-CoV-2 RNA is  generally detectable in upper and lower respiratory specimens during the acute phase of infection. The lowest concentration of SARS-CoV-2 viral copies this assay can detect is 250 copies / mL. A negative result does not preclude SARS-CoV-2 infection and should not be used as the sole basis for treatment or other patient management decisions.  A negative result may occur with improper specimen collection / handling, submission of specimen other than nasopharyngeal swab, presence of viral mutation(s) within the areas targeted by this assay, and inadequate number of viral copies (<250 copies / mL). A negative result must be combined with clinical observations, patient history, and epidemiological information.  Fact Sheet for Patients:   BoilerBrush.com.cy  Fact Sheet for Healthcare Providers: https://pope.com/  This test is not yet approved or  cleared by the [place] FDA and has been authorized for detection and/or diagnosis of SARS-CoV-2 by FDA under an Emergency Use Authorization (EUA).  This EUA will remain in effect (meaning this test can be used) for the duration of the COVID-19 declaration under Section 564(b)(1) of the Act, 21 U.S.C. section 360bbb-3(b)(1), unless the authorization is terminated or revoked sooner.  Performed at [facility], [address], [place], [place] [address]   Urine culture     Status: Abnormal   Collection Time: 02/03/20  8:44 PM   Specimen: Urine, Random  Result Value Ref Range Status   Specimen Description URINE, RANDOM  Final   Special Requests NONE  Final   Culture (A)  Final    >=100,000 COLONIES/mL AEROCOCCUS SPECIES Standardized susceptibility testing for this organism is not available. Performed at [facility], [address], [place], [place] [address]    Report Status 02/06/2020 FINAL  Final  Culture, blood (routine x 2)     Status: None (Preliminary result)    Collection Time: 02/03/20  9:38 PM   Specimen: BLOOD  Result Value Ref Range Status   Specimen Description BLOOD LEFT UPPER ARM  Final   Special Requests   Final    BOTTLES DRAWN AEROBIC AND ANAEROBIC Blood Culture adequate volume   Culture   Final    NO GROWTH 3 DAYS Performed at [facility], [address], [place], [place] [address]    Report Status PENDING  Incomplete     Labs: BNP (last 3 results) No results for input(s): BNP in the last 8760 hours. Basic Metabolic Panel: Recent Labs  Lab 02/03/20 1400 02/03/20 2014 02/04/20 0022 02/04/20 0213 02/05/20 0510 02/06/20 0449  NA 141  --  142  --  143 142  K 3.9  --  5.6* 3.5 3.5 4.2  CL 108  --  114*  --  113* 118*  CO2 22  --  14*  --  19* 19*  GLUCOSE 178*  --  76  --  129*  166*  BUN 24*  --  24*  --  12 11  CREATININE 0.99  --  0.96  --  0.86 0.88  CALCIUM 10.0  --  9.0  --  8.9 8.5*  MG  --  1.7  --   --  1.2* 1.7  PHOS  --   --   --   --  3.7 3.1   Liver Function Tests: Recent Labs  Lab 02/05/20 0510 02/06/20 0449  AST 31 26  ALT 16 15  ALKPHOS 35* 37*  BILITOT 0.8 0.5  PROT 5.8* 5.4*  ALBUMIN 2.8* 2.7*   No results for input(s): LIPASE, AMYLASE in the last 168 hours. No results for input(s): AMMONIA in the last 168 hours. CBC: Recent Labs  Lab 02/03/20 1403 02/04/20 0022 02/05/20 0510 02/06/20 0449  WBC 15.7* 11.0* 7.3 6.4  NEUTROABS  --   --  3.7 3.1  HGB 13.1 11.7* 12.1 11.1*  HCT 42.4 39.3 38.4 34.6*  MCV 93.6 95.9 94.8 93.0  PLT 299 261 207 PLATELET CLUMPS NOTED ON SMEAR, COUNT APPEARS ADEQUATE   Cardiac Enzymes: No results for input(s): CKTOTAL, CKMB, CKMBINDEX, TROPONINI in the last 168 hours. BNP: Invalid input(s): POCBNP CBG: Recent Labs  Lab 02/07/20 0041 02/07/20 0059 02/07/20 0158 02/07/20 0337 02/07/20 0749  GLUCAP 44* 111* 138* 170* 191*   D-Dimer No results for input(s): DDIMER in the last 72 hours. Hgb A1c No results for input(s): HGBA1C in the last 72  hours. Lipid Profile Recent Labs    02/05/20 0510  CHOL 194  HDL 47  LDLCALC 120*  TRIG 135  CHOLHDL 4.1   Thyroid function studies No results for input(s): TSH, T4TOTAL, T3FREE, THYROIDAB in the last 72 hours.  Invalid input(s): FREET3 Anemia work up No results for input(s): VITAMINB12, FOLATE, FERRITIN, TIBC, IRON, RETICCTPCT in the last 72 hours. Urinalysis    Component Value Date/Time   COLORURINE YELLOW 02/03/2020 2044   APPEARANCEUR CLEAR 02/03/2020 2044   LABSPEC 1.018 02/03/2020 2044   PHURINE 5.0 02/03/2020 2044   GLUCOSEU NEGATIVE 02/03/2020 2044   HGBUR NEGATIVE 02/03/2020 2044   BILIRUBINUR NEGATIVE 02/03/2020 2044   KETONESUR NEGATIVE 02/03/2020 2044   PROTEINUR 30 (A) 02/03/2020 2044   NITRITE NEGATIVE 02/03/2020 2044   LEUKOCYTESUR NEGATIVE 02/03/2020 2044   Sepsis Labs Invalid input(s): PROCALCITONIN,  WBC,  LACTICIDVEN Microbiology Recent Results (from the past 240 hour(s))  Culture, blood (routine x 2)     Status: None (Preliminary result)   Collection Time: 02/03/20  8:14 PM   Specimen: BLOOD  Result Value Ref Range Status   Specimen Description BLOOD SITE NOT SPECIFIED  Final   Special Requests   Final    BOTTLES DRAWN AEROBIC AND ANAEROBIC Blood Culture results may not be optimal due to an inadequate volume of blood received in culture bottles   Culture   Final    NO GROWTH 3 DAYS Performed at [facility], [address], [place], [place] [address]    Report Status PENDING  Incomplete  SARS Coronavirus 2 by RT PCR (hospital order, performed in [facility] hospital lab) Nasopharyngeal Nasopharyngeal Swab     Status: None   Collection Time: 02/03/20  8:31 PM   Specimen: Nasopharyngeal Swab  Result Value Ref Range Status   SARS Coronavirus 2 NEGATIVE NEGATIVE Final    Comment: (NOTE) SARS-CoV-2 target nucleic acids are NOT DETECTED.  The SARS-CoV-2 RNA is generally detectable in upper and lower respiratory specimens during the  acute  phase of infection. The lowest concentration of SARS-CoV-2 viral copies this assay can detect is 250 copies / mL. A negative result does not preclude SARS-CoV-2 infection and should not be used as the sole basis for treatment or other patient management decisions.  A negative result may occur with improper specimen collection / handling, submission of specimen other than nasopharyngeal swab, presence of viral mutation(s) within the areas targeted by this assay, and inadequate number of viral copies (<250 copies / mL). A negative result must be combined with clinical observations, patient history, and epidemiological information.  Fact Sheet for Patients:   BoilerBrush.com.cy  Fact Sheet for Healthcare Providers: https://pope.com/  This test is not yet approved or  cleared by the [place] FDA and has been authorized for detection and/or diagnosis of SARS-CoV-2 by FDA under an Emergency Use Authorization (EUA).  This EUA will remain in effect (meaning this test can be used) for the duration of the COVID-19 declaration under Section 564(b)(1) of the Act, 21 U.S.C. section 360bbb-3(b)(1), unless the authorization is terminated or revoked sooner.  Performed at [facility], [address] [place], [place] [address]   Urine culture     Status: Abnormal   Collection Time: 02/03/20  8:44 PM   Specimen: Urine, Random  Result Value Ref Range Status   Specimen Description URINE, RANDOM  Final   Special Requests NONE  Final   Culture (A)  Final    >=100,000 COLONIES/mL AEROCOCCUS SPECIES Standardized susceptibility testing for this organism is not available. Performed at [facility], [address], [place], [place] [address]    Report Status 02/06/2020 FINAL  Final  Culture, blood (routine x 2)     Status: None (Preliminary result)   Collection Time: 02/03/20  9:38 PM   Specimen: BLOOD  Result Value Ref Range Status    Specimen Description BLOOD LEFT UPPER ARM  Final   Special Requests   Final    BOTTLES DRAWN AEROBIC AND ANAEROBIC Blood Culture adequate volume   Culture   Final    NO GROWTH 3 DAYS Performed at [facility], [address], [place], [place] [address]    Report Status PENDING  Incomplete     Time coordinating discharge: 45 minutes  SIGNED:   Coralie Keens, MD  [facility] 02/07/2020, 9:52 AM

## 2020-02-07 NOTE — Progress Notes (Signed)
Pt with discharge orders. Discharge paperwork reviewed with pt and then with daughter at bedside. All questions answered. Prescriptions electronically faxed to pharmacy. Equipment delivered to bedside and with pt at time of discharge. Pt escorted out via wheelchair with all belongings.

## 2020-02-07 NOTE — Evaluation (Signed)
Physical Therapy Evaluation Patient Details Name: Nichole [name] MRN: [id number] DOB: Oct 14, 1955 Today's Date: 02/07/2020   History of Present Illness  64 yo female who was presented to the hospital for seizure like s/s. EEG WFL. Pt has past medical history of asthma, chronic paroxysmal atrial fibrillation, systolic heart failure, coronary artery disease, hypertension, dyslipidemia and history of CVA  Clinical Impression  Pt was evaluated for the diagnosis above and the impairments listed below. Pt was noted to need increased time with processing information. Pt required min guard for safety with transfers, ambulation and stair navigation. Pt was able to ambulate without RW but required it after 50 feet of ambulation secondary to instability. Educated on utilizing RW at home for safety. Recommend HHPT for this patient. Acute care therapy will continue to follow this pt in order to maximize independence with functional tasks.     Follow Up Recommendations Home health PT    Equipment Recommendations  None recommended by PT    Recommendations for Other Services       Precautions / Restrictions Precautions Precautions: Fall Restrictions Weight Bearing Restrictions: No      Mobility  Bed Mobility   General bed mobility comments: pt was sitting in recliner at beginning of session  Transfers Overall transfer level: Needs assistance Equipment used: None Transfers: Sit to/from Stand Sit to Stand: Min guard         General transfer comment: min guard assist for safety  Ambulation/Gait Ambulation/Gait assistance: Min guard Gait Distance (Feet): 200 Feet Assistive device: Rolling walker (2 wheeled) Gait Pattern/deviations: Decreased step length - right;Decreased step length - left;Decreased dorsiflexion - right;Decreased dorsiflexion - left;Decreased stance time - left Gait velocity: decreased    General Gait Details: pt did not initially require RW for ambulation. However, with  increased distance, pt had noted LOB and decreased stability. Pt's gait was noted to have decreased stance time on left with decrease step length on the right. Observed to have an antalgic gait but stated she had no pain. Pt was given RW after 50 feet of ambulation. Gait pattern normalized with RW except decreased DF on BLE . Pt was educated on RW sequencing and cues for proximity to device  Stairs Stairs: Yes Stairs assistance: Min guard Stair Management: One rail Right;Step to pattern;Sideways;Forwards Number of Stairs: 6 General stair comments: pt did a step to gait pattern with stair ascension forwards with both hands on the R railing. Pt utilized sideways descending stair navigation with BLE support on railing  Wheelchair Mobility    Modified Rankin (Stroke Patients Only)       Balance Overall balance assessment: Needs assistance Sitting-balance support: No upper extremity supported;Feet supported Sitting balance-Leahy Scale: Good Sitting balance - Comments: pt set edge of chair without UE support   Standing balance support: No upper extremity supported;During functional activity Standing balance-Leahy Scale: Fair Standing balance comment: pt able to maintain static standing balance with feet together for 30 seconds without LOB     Tandem Stance - Right Leg: 20 (LOB x 1, required min assist for stability)                       Pertinent Vitals/Pain Pain Assessment: No/denies pain    Home Living Family/patient expects to be discharged to:: Private residence Living Arrangements: Children Available Help at Discharge: Family Type of Home: Apartment Home Access: Stairs to enter Entrance Stairs-Rails: Right Entrance Stairs-Number of Steps: 10 Home Layout: One level Home Equipment: Environmental consultant -  2 wheels;Cane - single point Additional Comments: Pt recently had husband pass away, lives her daughter now and states that she will be "moving in with her son down in [place]  this weekend"    Prior Function Level of Independence: Needs assistance   Gait / Transfers Assistance Needed: reports she was independent with gait  ADL's / Homemaking Assistance Needed: needs assistance with cooking and cleaning, reports independence with bathing and dressing        Hand Dominance        Extremity/Trunk Assessment   Upper Extremity Assessment Upper Extremity Assessment: Overall WFL for tasks assessed    Lower Extremity Assessment Lower Extremity Assessment: Overall WFL for tasks assessed    Cervical / Trunk Assessment Cervical / Trunk Assessment: Kyphotic  Communication   Communication: No difficulties  Cognition Arousal/Alertness: Awake/alert Behavior During Therapy: WFL for tasks assessed/performed Overall Cognitive Status: No family/caregiver present to determine baseline cognitive functioning Area of Impairment: Following commands                       Following Commands: Follows multi-step commands with increased time       General Comments: slow processing noted      General Comments      Exercises     Assessment/Plan    PT Assessment Patient needs continued PT services  PT Problem List Decreased strength;Decreased range of motion;Decreased activity tolerance;Decreased balance;Decreased coordination;Decreased mobility;Decreased cognition;Decreased knowledge of use of DME;Decreased safety awareness       PT Treatment Interventions DME instruction;Gait training;Stair training;Functional mobility training;Therapeutic activities;Therapeutic exercise;Balance training;Neuromuscular re-education;Patient/family education    PT Goals (Current goals can be found in the Care Plan section)  Acute Rehab PT Goals Patient Stated Goal: return home PT Goal Formulation: With patient Time For Goal Achievement: 02/07/20 Potential to Achieve Goals: Fair    Frequency Min 3X/week   Barriers to discharge        Co-evaluation                AM-PAC PT "6 Clicks" Mobility  Outcome Measure Help needed turning from your back to your side while in a flat bed without using bedrails?: None Help needed moving from lying on your back to sitting on the side of a flat bed without using bedrails?: None Help needed moving to and from a bed to a chair (including a wheelchair)?: A Little Help needed standing up from a chair using your arms (e.g., wheelchair or bedside chair)?: A Little Help needed to walk in hospital room?: A Little Help needed climbing 3-5 steps with a railing? : A Little 6 Click Score: 20    End of Session Equipment Utilized During Treatment: Gait belt Activity Tolerance: Patient tolerated treatment well Patient left: in chair;with call Berenize Gatlin/phone within reach Nurse Communication: Mobility status PT Visit Diagnosis: Other abnormalities of gait and mobility (R26.89);Difficulty in walking, not elsewhere classified (R26.2);Unsteadiness on feet (R26.81)    Time: 7628-3151 PT Time Calculation (min) (ACUTE ONLY): 39 min   Charges:   PT Evaluation $PT Eval Moderate Complexity: 1 Mod PT Treatments $Gait Training: 23-37 mins       Harmon Pier, SPT  Acute Rehabilitation Services  Office: [phone]  02/07/2020, 12:08 PM

## 2020-02-08 LAB — CULTURE, BLOOD (ROUTINE X 2)
Culture: NO GROWTH
Culture: NO GROWTH
Special Requests: ADEQUATE

## 2020-02-13 ENCOUNTER — Emergency Department (HOSPITAL_COMMUNITY)
Admission: EM | Admit: 2020-02-13 | Discharge: 2020-02-14 | Disposition: A | Discharge status: Home / Self Care | Payer: Medicare Other | Attending: Emergency Medicine | Admitting: Emergency Medicine

## 2020-02-13 ENCOUNTER — Encounter (HOSPITAL_COMMUNITY): Payer: Self-pay | Admitting: Emergency Medicine

## 2020-02-13 DIAGNOSIS — Z794 Long term (current) use of insulin: Secondary | ICD-10-CM | POA: Diagnosis not present

## 2020-02-13 DIAGNOSIS — R569 Unspecified convulsions: Secondary | ICD-10-CM | POA: Diagnosis not present

## 2020-02-13 DIAGNOSIS — Z87891 Personal history of nicotine dependence: Secondary | ICD-10-CM | POA: Insufficient documentation

## 2020-02-13 DIAGNOSIS — Y9389 Activity, other specified: Secondary | ICD-10-CM | POA: Insufficient documentation

## 2020-02-13 DIAGNOSIS — I5022 Chronic systolic (congestive) heart failure: Secondary | ICD-10-CM | POA: Insufficient documentation

## 2020-02-13 DIAGNOSIS — S069X9A Unspecified intracranial injury with loss of consciousness of unspecified duration, initial encounter: Secondary | ICD-10-CM | POA: Diagnosis present

## 2020-02-13 DIAGNOSIS — Z79899 Other long term (current) drug therapy: Secondary | ICD-10-CM | POA: Diagnosis not present

## 2020-02-13 DIAGNOSIS — Y92092 Bedroom in other non-institutional residence as the place of occurrence of the external cause: Secondary | ICD-10-CM | POA: Insufficient documentation

## 2020-02-13 DIAGNOSIS — Y998 Other external cause status: Secondary | ICD-10-CM | POA: Diagnosis not present

## 2020-02-13 DIAGNOSIS — W010XXA Fall on same level from slipping, tripping and stumbling without subsequent striking against object, initial encounter: Secondary | ICD-10-CM | POA: Insufficient documentation

## 2020-02-13 DIAGNOSIS — I110 Hypertensive heart disease with heart failure: Secondary | ICD-10-CM | POA: Insufficient documentation

## 2020-02-13 DIAGNOSIS — E039 Hypothyroidism, unspecified: Secondary | ICD-10-CM | POA: Diagnosis not present

## 2020-02-13 DIAGNOSIS — I2510 Atherosclerotic heart disease of native coronary artery without angina pectoris: Secondary | ICD-10-CM | POA: Diagnosis not present

## 2020-02-13 HISTORY — DX: Paroxysmal atrial fibrillation: I48.0

## 2020-02-13 HISTORY — DX: Unspecified convulsions: R56.9

## 2020-02-13 HISTORY — DX: Essential (primary) hypertension: I10

## 2020-02-13 HISTORY — DX: Hypothyroidism, unspecified: E03.9

## 2020-02-13 HISTORY — DX: Heart failure, unspecified: I50.9

## 2020-02-13 HISTORY — DX: Type 2 diabetes mellitus without complications: E11.9

## 2020-02-13 LAB — BASIC METABOLIC PANEL
Anion gap: 11 (ref 5–15)
BUN: 17 mg/dL (ref 8–23)
CO2: 23 mmol/L (ref 22–32)
Calcium: 9.6 mg/dL (ref 8.9–10.3)
Chloride: 108 mmol/L (ref 98–111)
Creatinine, Ser: 1.38 mg/dL — ABNORMAL HIGH (ref 0.44–1.00)
GFR calc Af Amer: 47 mL/min — ABNORMAL LOW (ref >60)
GFR calc non Af Amer: 40 mL/min — ABNORMAL LOW (ref >60)
Glucose, Bld: 203 mg/dL — ABNORMAL HIGH (ref 70–99)
Potassium: 3.3 mmol/L — ABNORMAL LOW (ref 3.5–5.1)
Sodium: 142 mmol/L (ref 135–145)

## 2020-02-13 LAB — CBC
HCT: 37.7 % (ref 36.0–46.0)
Hemoglobin: 11.5 g/dL — ABNORMAL LOW (ref 12.0–15.0)
MCH: 28.5 pg (ref 26.0–34.0)
MCHC: 30.5 g/dL (ref 30.0–36.0)
MCV: 93.3 fL (ref 80.0–100.0)
Platelets: 333 10*3/uL (ref 150–400)
RBC: 4.04 MIL/uL (ref 3.87–5.11)
RDW: 13.4 % (ref 11.5–15.5)
WBC: 9.8 10*3/uL (ref 4.0–10.5)
nRBC: 0 % (ref 0.0–0.2)

## 2020-02-13 LAB — CBG MONITORING, ED: Glucose-Capillary: 186 mg/dL — ABNORMAL HIGH (ref 70–99)

## 2020-02-13 MED ORDER — SODIUM CHLORIDE 0.9% FLUSH: 3.0000 mL | Freq: Once | INTRAVENOUS | Status: DC

## 2020-02-13 NOTE — ED Triage Notes (Signed)
BIB EMS from home for seizure like activity. Patient was found "unresponsive" by family, by time EMS arrived patient alert but disoriented. Patient cannot answer questions appropriately. Seen recently for same, was started on Keppra and instructed to follow up with neurology on outpatient basis.

## 2020-02-14 ENCOUNTER — Other Ambulatory Visit: Payer: Self-pay

## 2020-02-14 ENCOUNTER — Encounter (HOSPITAL_COMMUNITY): Payer: Self-pay | Admitting: Emergency Medicine

## 2020-02-14 LAB — URINALYSIS, ROUTINE W REFLEX MICROSCOPIC
Bacteria, UA: NONE SEEN
Bilirubin Urine: NEGATIVE
Glucose, UA: NEGATIVE mg/dL
Hgb urine dipstick: NEGATIVE
Ketones, ur: NEGATIVE mg/dL
Leukocytes,Ua: NEGATIVE
Nitrite: NEGATIVE
Protein, ur: 30 mg/dL — AB
Specific Gravity, Urine: 1.017 (ref 1.005–1.030)
pH: 6 (ref 5.0–8.0)

## 2020-02-14 LAB — PROTIME-INR
INR: 1.3 — ABNORMAL HIGH (ref 0.8–1.2)
Prothrombin Time: 15.3 seconds — ABNORMAL HIGH (ref 11.4–15.2)

## 2020-02-14 MED ORDER — LEVETIRACETAM 750 MG PO TABS
1250.0000 mg | ORAL_TABLET | Freq: Once | ORAL | Status: AC
  Administered 2020-02-14: 1250 mg via ORAL
  Filled 2020-02-14: qty 1

## 2020-02-14 MED ORDER — LEVETIRACETAM ER 750 MG PO TB24: 750.0000 mg | ORAL_TABLET | Freq: Two times a day (BID) | ORAL | 0 refills | Status: DC

## 2020-02-14 NOTE — ED Provider Notes (Signed)
[facility] EMERGENCY DEPARTMENT Provider Note   CSN: [id number] Arrival date & time: 02/13/20  2239     History Chief Complaint  Patient presents with  . Loss of Consciousness    Nichole [name] is a 64 y.o. female.  The history is provided by the patient and a relative (Daughter).        Nichole [name] is a 64 y.o. female, with a history of A. fib, CHF, dyslipidemia, seizures, presenting to the ED with an episode of change in responsiveness. Patient is unable to give many details because she does not remember the event.  She states last thing she remembers is eating dinner and then the paramedics were by her side.  Patient's daughter fills in the rest of the event.  She states patient ate dinner, they both walked back to the bedrooms, patient sat on her bed and daughter was doing some ironing.  Daughter states patient's had tripped down to her chest, but patient remained sitting upright on the edge of the bed.  She would not respond to voice or physical attempts to "wake her up."  When she tried to lay the patient down, the patient seemed to resist.  This lasted for 10 to 15 minutes and she seemed to be awake again by the time fire department arrived. She seemed "out of it" and "glassy eyed" for several minutes afterward.  Daughter states fire department told her the patient seemed postictal.  Upon my interview, daughter states patient seems to be acting normally.  Patient has been compliant with her medications, including the recently added Keppra.  No recent falls or injuries.  Daughter does not know the manufacturer of the patient's pacemaker/defibrillator.  Denies changes in medication, fever/chills, cough, chest pain, shortness of breath, abdominal pain, N/V/D, urinary symptoms, or any other complaints.   Past Medical History:  Diagnosis Date  . CHF (congestive heart failure) (HCC)   . Diabetes mellitus without complication (HCC)   . Hypertension   .  Hypothyroidism   . Paroxysmal atrial fibrillation (HCC)   . Seizures [facility])     Patient Active Problem List   Diagnosis Date Noted  . Unspecified atrial fibrillation (HCC) 02/04/2020  . History of cardioembolic cerebrovascular accident (CVA) 02/04/2020  . Chronic systolic CHF (congestive heart failure) (HCC) 02/04/2020  . CAD (coronary artery disease) 02/04/2020  . Essential hypertension 02/04/2020  . Dyslipidemia 02/04/2020  . Hypothyroidism 02/04/2020  . Seizure (HCC) 02/03/2020    History reviewed. No pertinent surgical history.   OB History   No obstetric history on file.     History reviewed. No pertinent family history.  Social History   Tobacco Use  . Smoking status: Former Smoker    Packs/day: 0.25    Quit date: 02/06/1999    Years since quitting: 21.0  . Smokeless tobacco: Never Used  Substance Use Topics  . Alcohol use: Not on file  . Drug use: Not on file    Home Medications Prior to Admission medications   Medication Sig Start Date End Date Taking? Authorizing Provider  atorvastatin (LIPITOR) 20 MG tablet Take 1 tablet (20 mg total) by mouth daily. 02/07/20 03/08/20 Yes Arrien, York Ram, MD  carvedilol (COREG) 12.5 MG tablet Take 12.5 mg by mouth in the morning and at bedtime. 07/15/18 03/08/20 Yes [provider]  Cholecalciferol (VITAMIN D3) 1.25 MG (50000 UT) CAPS Take 50,000 Units by mouth every Friday. 01/10/20  Yes [provider]  furosemide (LASIX) 40 MG tablet Take  40 mg by mouth daily. 01/10/20  Yes [provider]  glimepiride (AMARYL) 4 MG tablet Take 4 mg by mouth daily with breakfast.  01/10/20  Yes [provider]  hydrALAZINE (APRESOLINE) 100 MG tablet Take 100 mg by mouth 3 (three) times daily. 01/12/20  Yes [provider]  insulin glargine (LANTUS SOLOSTAR) 100 UNIT/ML Solostar Pen Inject 30 Units into the skin 2 (two) times daily.   Yes [provider]  levothyroxine (SYNTHROID) 125 MCG  tablet Take 125 mcg by mouth every morning. 09/06/19  Yes [provider]  metFORMIN (GLUCOPHAGE) 1000 MG tablet Take 1,000 mg by mouth 2 (two) times daily. 01/10/20  Yes [provider]  mirabegron ER (MYRBETRIQ) 25 MG TB24 tablet Take 25 mg by mouth daily.   Yes [provider]  ramipril (ALTACE) 5 MG capsule Take 5 mg by mouth daily.  01/10/20  Yes [provider]  warfarin (COUMADIN) 6 MG tablet Take 6 mg by mouth every evening. 01/10/20  Yes [provider]  levETIRAcetam 750 MG TB24 Take 1 tablet (750 mg total) by mouth 2 (two) times daily. 02/14/20 03/15/20  Lucinda Spells, Hillard Danker, PA-C    Allergies    Patient has no known allergies.  Review of Systems   Review of Systems  Constitutional: Negative for chills and fever.  Respiratory: Negative for cough and shortness of breath.   Cardiovascular: Negative for chest pain.  Gastrointestinal: Negative for abdominal pain, diarrhea, nausea and vomiting.  Neurological: Positive for seizures (?). Negative for weakness and headaches.  All other systems reviewed and are negative.   Physical Exam Updated Vital Signs BP 126/80 (BP Location: Right Arm)   Pulse 71   Temp 97.8 F (36.6 C) (Oral)   Resp 16   SpO2 95%   Physical Exam Vitals and nursing note reviewed.  Constitutional:      General: She is not in acute distress.    Appearance: She is well-developed. She is not diaphoretic.  HENT:     Head: Normocephalic and atraumatic.     Mouth/Throat:     Mouth: Mucous membranes are moist.     Pharynx: Oropharynx is clear.  Eyes:     Conjunctiva/sclera: Conjunctivae normal.  Cardiovascular:     Rate and Rhythm: Normal rate and regular rhythm.     Pulses: Normal pulses.          Radial pulses are 2+ on the right side and 2+ on the left side.       Posterior tibial pulses are 2+ on the right side and 2+ on the left side.     Heart sounds: Normal heart sounds.     Comments: Tactile temperature in the  extremities appropriate and equal bilaterally. Pulmonary:     Effort: Pulmonary effort is normal. No respiratory distress.     Breath sounds: Normal breath sounds.  Abdominal:     Palpations: Abdomen is soft.     Tenderness: There is no abdominal tenderness. There is no guarding.  Musculoskeletal:     Cervical back: Neck supple.     Right lower leg: No edema.     Left lower leg: No edema.  Lymphadenopathy:     Cervical: No cervical adenopathy.  Skin:    General: Skin is warm and dry.  Neurological:     Mental Status: She is alert.     Comments: No noted acute cognitive deficit. Sensation grossly intact to light touch in the extremities.   Grip strengths  equal bilaterally.   Strength 5/5 in all extremities.  Coordination intact.  Cranial nerves III-XII grossly intact.  Handles oral secretions without noted difficulty.  No noted phonation or speech deficit. No facial droop.   Psychiatric:        Mood and Affect: Mood and affect normal.        Speech: Speech normal.        Behavior: Behavior normal.     ED Results / Procedures / Treatments   Labs (all labs ordered are listed, but only abnormal results are displayed) Labs Reviewed  BASIC METABOLIC PANEL - Abnormal; Notable for the following components:      Result Value   Potassium 3.3 (*)    Glucose, Bld 203 (*)    Creatinine, Ser 1.38 (*)    GFR calc non Af Amer 40 (*)    GFR calc Af Amer 47 (*)    All other components within normal limits  CBC - Abnormal; Notable for the following components:   Hemoglobin 11.5 (*)    All other components within normal limits  URINALYSIS, ROUTINE W REFLEX MICROSCOPIC - Abnormal; Notable for the following components:   Protein, ur 30 (*)    All other components within normal limits  PROTIME-INR - Abnormal; Notable for the following components:   Prothrombin Time 15.3 (*)    INR 1.3 (*)    All other components within normal limits  CBG MONITORING, ED - Abnormal; Notable for the  following components:   Glucose-Capillary 186 (*)    All other components within normal limits   Hemoglobin  Date Value Ref Range Status  02/13/2020 11.5 (L) 12.0 - 15.0 g/dL Final  37/62/8315 17.6 (L) 12.0 - 15.0 g/dL Final  16/01/3709 62.6 (L) 12.0 - 15.0 g/dL Final    Comment:    REPEATED TO VERIFY  02/05/2020 12.1 12.0 - 15.0 g/dL Final   EKG None  Radiology No results found.  Procedures Procedures (including critical care time)  Medications Ordered in ED Medications  sodium chloride flush (NS) 0.9 % injection 3 mL (has no administration in time range)  levETIRAcetam (KEPPRA) tablet 1,250 mg (1,250 mg Oral Given 02/14/20 1116)    ED Course  I have reviewed the triage vital signs and the nursing notes.  Pertinent labs & imaging results that were available during my care of the patient were reviewed by me and considered in my medical decision making (see chart for details).  Clinical Course as of Feb 14 1335  Wed Feb 14, 2020  9485 Tried to call patient's daughter. No answer. Left VM with callback number.    [SJ]  0845 Patient's daughter came into the ED.  I spoke with her at the bedside. She described the event that occurred last night, detailed in the HPI. She states patient is otherwise been well.  She states the patient has been compliant with her Keppra.   [SJ]  [id number] Spoke with Dr. Amada Jupiter, neurologist.  We discussed the patient's reported symptoms as told by the patient's daughter. No additional work-up or imaging necessary. Recommends administering 1250 mg Keppra IR by mouth here in the ED since patient likely missed her evening and morning doses. Increase her daily Keppra XR to 750 mg twice daily.   [SJ]  1156 Spoke with Fleet Contras, pharmacist, regarding patient's INR.  She recommends patient take 1-1/2 tablets of her 6 mg prescribed Coumadin (9 mg) for the next 2 days and then be sure to keep your appointment for INR  recheck Monday.   [SJ]    Clinical Course  User Index [SJ] Traevion Poehler, Hillard DankerShawn C, PA-C   MDM Rules/Calculators/A&P                          Patient presents following a suspected seizure.  She was in the waiting room for quite some time without recurrence of seizure episode. Patient is nontoxic appearing, afebrile, not tachycardic, not tachypneic, not hypotensive, maintains excellent SPO2 on room air, and is in no apparent distress.   I have reviewed the patient's chart to obtain more information.   I reviewed and interpreted the patient's labs and radiological studies. Mild hypokalemia.  Patient is already on supplementation. Some increasing the patient's creatinine noted.  This was discussed with the patient and her daughter.  Due to her low EF on her latest echo, we discussed options for rehydration.  Patient and her daughter opted for oral hydration. INR subtherapeutic.  We will have the patient take increased Coumadin doses over the next couple days.  She apparently saw her PCP 2 days ago, her INR was reportedly subtherapeutic then as well.  She was told to have it checked again in 7 to 10 days.  Patient is in the process of establishing herself in [place] She has appointments set up for cardiology and neurology in the local area.  Patient and her daughter were given instructions for home care as well as return precautions.  Both parties voice understanding of these instructions, accept the plan, and are comfortable with discharge.  Findings and plan of care discussed with Theda Belfasthris Tegeler, MD.   Vitals:   02/14/20 1036 02/14/20 1037 02/14/20 1230 02/14/20 1234  BP:   140/79   Pulse:   66   Resp:   (!) 21   Temp:    97.9 F (36.6 C)  TempSrc:    Oral  SpO2:  97% 95%   Weight: 72.6 kg     Height: 5' (1.524 m)        Final Clinical Impression(s) / ED Diagnoses Final diagnoses:  Seizure [facility](HCC)    Rx / DC Orders ED Discharge Orders         Ordered    levETIRAcetam 750 MG  TB24  2 times daily     Discontinue  Reprint     02/14/20 1001           Anselm PancoastJoy, Teyana Pierron C, PA-C 02/14/20 1344    Tegeler, Canary Brimhristopher J, MD 02/14/20 [phone]

## 2020-02-14 NOTE — ED Notes (Signed)
Pure wick has been placed. Suction set to 45mmHg.  

## 2020-02-14 NOTE — Discharge Instructions (Addendum)
Recommend increasing the levetiracetam XR (Keppra XR) from 500 mg twice daily to 750 mg twice daily. Make contact with the neurologist regarding subsequent seizure.  Your INR was lower than it should be.  It was 1.3 today and should be between 2.0 and 3.0. The pharmacist recommends taking 1.5 tablets of the 6 mg Coumadin tablets, which would be 9 mg, for the next 2 days. Have this INR rechecked by primary doctor or at the cardiology office by Monday, August 9.  Call to make sure they can accommodate this type of appointment.

## 2020-03-06 ENCOUNTER — Ambulatory Visit: Payer: Medicare Other | Admitting: Internal Medicine

## 2020-03-08 ENCOUNTER — Encounter: Payer: Self-pay | Admitting: *Deleted

## 2020-03-09 ENCOUNTER — Encounter (HOSPITAL_COMMUNITY): Payer: Self-pay | Admitting: Emergency Medicine

## 2020-03-09 ENCOUNTER — Emergency Department (HOSPITAL_COMMUNITY)
Admission: EM | Admit: 2020-03-09 | Discharge: 2020-03-09 | Disposition: A | Discharge status: Home / Self Care | Payer: Medicare Other | Attending: Emergency Medicine | Admitting: Emergency Medicine

## 2020-03-09 ENCOUNTER — Other Ambulatory Visit: Payer: Self-pay

## 2020-03-09 DIAGNOSIS — R569 Unspecified convulsions: Secondary | ICD-10-CM | POA: Diagnosis not present

## 2020-03-09 DIAGNOSIS — I5022 Chronic systolic (congestive) heart failure: Secondary | ICD-10-CM | POA: Diagnosis not present

## 2020-03-09 DIAGNOSIS — Z79899 Other long term (current) drug therapy: Secondary | ICD-10-CM | POA: Diagnosis not present

## 2020-03-09 DIAGNOSIS — Z87891 Personal history of nicotine dependence: Secondary | ICD-10-CM | POA: Diagnosis not present

## 2020-03-09 DIAGNOSIS — Z79890 Hormone replacement therapy (postmenopausal): Secondary | ICD-10-CM | POA: Insufficient documentation

## 2020-03-09 DIAGNOSIS — Z7901 Long term (current) use of anticoagulants: Secondary | ICD-10-CM | POA: Diagnosis not present

## 2020-03-09 DIAGNOSIS — E039 Hypothyroidism, unspecified: Secondary | ICD-10-CM | POA: Insufficient documentation

## 2020-03-09 DIAGNOSIS — I110 Hypertensive heart disease with heart failure: Secondary | ICD-10-CM | POA: Insufficient documentation

## 2020-03-09 DIAGNOSIS — Z7984 Long term (current) use of oral hypoglycemic drugs: Secondary | ICD-10-CM | POA: Insufficient documentation

## 2020-03-09 DIAGNOSIS — E119 Type 2 diabetes mellitus without complications: Secondary | ICD-10-CM | POA: Insufficient documentation

## 2020-03-09 LAB — CBC WITH DIFFERENTIAL/PLATELET
Abs Immature Granulocytes: 0.04 10*3/uL (ref 0.00–0.07)
Basophils Absolute: 0.1 10*3/uL (ref 0.0–0.1)
Basophils Relative: 1 %
Eosinophils Absolute: 0.3 10*3/uL (ref 0.0–0.5)
Eosinophils Relative: 3 %
HCT: 41.3 % (ref 36.0–46.0)
Hemoglobin: 13.1 g/dL (ref 12.0–15.0)
Immature Granulocytes: 0 %
Lymphocytes Relative: 17 %
Lymphs Abs: 1.9 10*3/uL (ref 0.7–4.0)
MCH: 29 pg (ref 26.0–34.0)
MCHC: 31.7 g/dL (ref 30.0–36.0)
MCV: 91.4 fL (ref 80.0–100.0)
Monocytes Absolute: 0.7 10*3/uL (ref 0.1–1.0)
Monocytes Relative: 7 %
Neutro Abs: 7.8 10*3/uL — ABNORMAL HIGH (ref 1.7–7.7)
Neutrophils Relative %: 72 %
Platelets: 339 10*3/uL (ref 150–400)
RBC: 4.52 MIL/uL (ref 3.87–5.11)
RDW: 12.6 % (ref 11.5–15.5)
WBC: 10.8 10*3/uL — ABNORMAL HIGH (ref 4.0–10.5)
nRBC: 0 % (ref 0.0–0.2)

## 2020-03-09 LAB — BASIC METABOLIC PANEL
Anion gap: 12 (ref 5–15)
BUN: 43 mg/dL — ABNORMAL HIGH (ref 8–23)
CO2: 20 mmol/L — ABNORMAL LOW (ref 22–32)
Calcium: 10.6 mg/dL — ABNORMAL HIGH (ref 8.9–10.3)
Chloride: 107 mmol/L (ref 98–111)
Creatinine, Ser: 1.65 mg/dL — ABNORMAL HIGH (ref 0.44–1.00)
GFR calc Af Amer: 38 mL/min — ABNORMAL LOW (ref >60)
GFR calc non Af Amer: 32 mL/min — ABNORMAL LOW (ref >60)
Glucose, Bld: 177 mg/dL — ABNORMAL HIGH (ref 70–99)
Potassium: 5.2 mmol/L — ABNORMAL HIGH (ref 3.5–5.1)
Sodium: 139 mmol/L (ref 135–145)

## 2020-03-09 LAB — PROTIME-INR
INR: 1.3 — ABNORMAL HIGH (ref 0.8–1.2)
Prothrombin Time: 15.6 seconds — ABNORMAL HIGH (ref 11.4–15.2)

## 2020-03-09 NOTE — ED Notes (Signed)
No answer for vitals recheck

## 2020-03-09 NOTE — ED Provider Notes (Signed)
[facility] EMERGENCY DEPARTMENT Provider Note   CSN: [id number] Arrival date & time: 03/09/20  1241     History Chief Complaint  Patient presents with  . Seizures    Nichole [name] is a 64 y.o. female w PMHx CHF, HTN, DM, PAF, seizures, presenting to the ED after seizure that occurred prior to arrival. Per patient's daughter, pt had just arrived to watch a grandsons football game. She had walked to the car to get a chair for the patient and when she returned she had begun to have a seizure. She reports her son reported that patient fell to her knees, though was caught and did not hit her head. She was put into a chair. Patients daughter reports she was staring off when she returned  to her, she was holding left elbow flexed and right extended, she was drooling and was incontinent of urine. This appeared similar to previous seizure episodes. She reports her son had already called EMS when she arrived back from the car. They only difference about this episode was her post-ictal period lasted longer than usual - for about 10 minutes in duration.  She has not followed up outpatient since last ED visit, though she does have a cardiology appointment on Monday. She has not had her INR rechecked, as it was noted to be subtherapeutic during last ED visit in the beginning of this month. This was treated in the ED. Patient denies any injuries, denies HA. She reports she remembers arriving to the game, and was waiting for the chairs to arrive, then she began feeling "funny" in her head. Next thing she remembers was the EMS arriving. She feels back to her baseline currently and has not complaints. She states she may have missed a dose  of her keppra the last couple of days.   The history is provided by the patient and a relative.       Past Medical History:  Diagnosis Date  . CHF (congestive heart failure) (HCC)   . Diabetes mellitus without complication (HCC)   . Hypertension   .  Hypothyroidism   . Paroxysmal atrial fibrillation (HCC)   . Seizures [facility])     Patient Active Problem List   Diagnosis Date Noted  . Unspecified atrial fibrillation (HCC) 02/04/2020  . History of cardioembolic cerebrovascular accident (CVA) 02/04/2020  . Chronic systolic CHF (congestive heart failure) (HCC) 02/04/2020  . CAD (coronary artery disease) 02/04/2020  . Essential hypertension 02/04/2020  . Dyslipidemia 02/04/2020  . Hypothyroidism 02/04/2020  . Seizure (HCC) 02/03/2020    Past Surgical History:  Procedure Laterality Date  . PACEMAKER IMPLANT       OB History   No obstetric history on file.     No family history on file.  Social History   Tobacco Use  . Smoking status: Former Smoker    Packs/day: 0.25    Quit date: 02/06/1999    Years since quitting: 21.1  . Smokeless tobacco: Never Used  Substance Use Topics  . Alcohol use: Never  . Drug use: Never    Home Medications Prior to Admission medications   Medication Sig Start Date End Date Taking? Authorizing Provider  atorvastatin (LIPITOR) 20 MG tablet Take 1 tablet (20 mg total) by mouth daily. 02/07/20 03/08/20  Arrien, York Ram, MD  carvedilol (COREG) 12.5 MG tablet Take 12.5 mg by mouth in the morning and at bedtime. 07/15/18 03/08/20  [provider]  Cholecalciferol (VITAMIN D3) 1.25 MG (50000 UT) CAPS  Take 50,000 Units by mouth every Friday. 01/10/20   [provider]  furosemide (LASIX) 40 MG tablet Take 40 mg by mouth daily. 01/10/20   [provider]  glimepiride (AMARYL) 4 MG tablet Take 4 mg by mouth daily with breakfast.  01/10/20   [provider]  hydrALAZINE (APRESOLINE) 100 MG tablet Take 100 mg by mouth 3 (three) times daily. 01/12/20   [provider]  insulin glargine (LANTUS SOLOSTAR) 100 UNIT/ML Solostar Pen Inject 30 Units into the skin 2 (two) times daily.    [provider]  levETIRAcetam 750 MG TB24 Take 1 tablet (750 mg total) by  mouth 2 (two) times daily. 02/14/20 03/15/20  Joy, Hillard Danker, PA-C  levothyroxine (SYNTHROID) 125 MCG tablet Take 125 mcg by mouth every morning. 09/06/19   [provider]  metFORMIN (GLUCOPHAGE) 1000 MG tablet Take 1,000 mg by mouth 2 (two) times daily. 01/10/20   [provider]  mirabegron ER (MYRBETRIQ) 25 MG TB24 tablet Take 25 mg by mouth daily.    [provider]  ramipril (ALTACE) 5 MG capsule Take 5 mg by mouth daily.  01/10/20   [provider]  warfarin (COUMADIN) 6 MG tablet Take 6 mg by mouth every evening. 01/10/20   [provider]    Allergies    Patient has no known allergies.  Review of Systems   Review of Systems  Neurological: Positive for seizures.  All other systems reviewed and are negative.   Physical Exam Updated Vital Signs BP (!) 153/90 (BP Location: Left Arm)   Pulse 61   Temp 98.2 F (36.8 C) (Oral)   Resp 18   SpO2 98%   Physical Exam Vitals and nursing note reviewed.  Constitutional:      General: She is not in acute distress.    Appearance: She is well-developed.  HENT:     Head: Normocephalic and atraumatic.  Eyes:     Extraocular Movements: Extraocular movements intact.     Conjunctiva/sclera: Conjunctivae normal.     Pupils: Pupils are equal, round, and reactive to light.  Cardiovascular:     Rate and Rhythm: Normal rate and regular rhythm.  Pulmonary:     Effort: Pulmonary effort is normal. No respiratory distress.     Breath sounds: Normal breath sounds.  Abdominal:     General: Bowel sounds are normal.     Palpations: Abdomen is soft.     Tenderness: There is no abdominal tenderness.  Musculoskeletal:     Cervical back: Normal range of motion.     Comments: No bruising, swelling, deformity to knees. No wounds. No TTP, nl ROM without pain.  Skin:    General: Skin is warm.  Neurological:     Mental Status: She is alert. Mental status is at baseline.     Comments: Cranial nerves grossly intact,  spontaneously moving all extremities equally.  Normal tone.  Psychiatric:        Behavior: Behavior normal.     ED Results / Procedures / Treatments   Labs (all labs ordered are listed, but only abnormal results are displayed) Labs Reviewed  BASIC METABOLIC PANEL - Abnormal; Notable for the following components:      Result Value   Potassium 5.2 (*)    CO2 20 (*)    Glucose, Bld 177 (*)    BUN 43 (*)    Creatinine, Ser 1.65 (*)    Calcium 10.6 (*)    GFR calc non Af  Amer 32 (*)    GFR calc Af Amer 38 (*)    All other components within normal limits  CBC WITH DIFFERENTIAL/PLATELET - Abnormal; Notable for the following components:   WBC 10.8 (*)    Neutro Abs 7.8 (*)    All other components within normal limits  PROTIME-INR - Abnormal; Notable for the following components:   Prothrombin Time 15.6 (*)    INR 1.3 (*)    All other components within normal limits    EKG None  Radiology No results found.  Procedures Procedures (including critical care time)  Medications Ordered in ED Medications - No data to display  ED Course  I have reviewed the triage vital signs and the nursing notes.  Pertinent labs & imaging results that were available during my care of the patient were reviewed by me and considered in my medical decision making (see chart for details).    MDM Rules/Calculators/A&P                          Patient with known seizure disorder on Keppra, presenting via EMS after seizure.  Seizure today appears similar to previous seizures.  Postictal state lasted about 10 minutes and she is now at her baseline.  She did not hit her head.  She has no complaints or injuries.  She has missed dose of Keppra this week may be the source of her recurrent seizure.  Labs today we will creatinine at baseline.  Her daughter reports this is known elevation in creatinine since at least March and is being worked up outpatient by PCP out of town.  INR is therapeutic.  Stressed  importance of not missing doses of Keppra.  She has cardiology appointment on Monday, encouraged to follow.  Provided neurology referral, as they have not yet established care with neurologist.  Patient and her daughter agreeable to plan, patient is appropriate for discharge.  Final Clinical Impression(s) / ED Diagnoses Final diagnoses:  Seizure [facility])    Rx / DC Orders ED Discharge Orders    None       Gabriana Wilmott, [place] N, PA-C 03/09/20 2014    Wynetta Fines, MD 03/12/20 1059

## 2020-03-09 NOTE — ED Triage Notes (Signed)
Pt to triage via [facility] from football game.  Family reports seizure lasting 1-2 min.  Hx of same.  Post-ictal on EMS arrival.  Denies any complaints.

## 2020-03-09 NOTE — Discharge Instructions (Addendum)
Your INR is within the appropriate therapeutic range. Please attend your appointment with the cardiologist on Monday. It is important that you not miss any doses of keppra, as this can cause recurrent seizure. Establish care with a neurologist for regular management of your seizures.

## 2020-03-09 NOTE — ED Notes (Signed)
DC instructions reviewed with daughter. Aware to keep cards appt on Monday and follow up with neuro

## 2020-03-11 ENCOUNTER — Encounter: Payer: Self-pay | Admitting: Internal Medicine

## 2020-03-11 ENCOUNTER — Other Ambulatory Visit: Payer: Self-pay

## 2020-03-11 ENCOUNTER — Ambulatory Visit (INDEPENDENT_AMBULATORY_CARE_PROVIDER_SITE_OTHER): Payer: Medicare Other | Admitting: Internal Medicine

## 2020-03-11 VITALS — BP 144/82 | HR 70 | Temp 97.1°F | Ht 60.0 in | Wt 146.0 lb

## 2020-03-11 DIAGNOSIS — I480 Paroxysmal atrial fibrillation: Secondary | ICD-10-CM | POA: Diagnosis not present

## 2020-03-11 DIAGNOSIS — I5022 Chronic systolic (congestive) heart failure: Secondary | ICD-10-CM | POA: Diagnosis not present

## 2020-03-11 DIAGNOSIS — I10 Essential (primary) hypertension: Secondary | ICD-10-CM | POA: Diagnosis not present

## 2020-03-11 DIAGNOSIS — I2510 Atherosclerotic heart disease of native coronary artery without angina pectoris: Secondary | ICD-10-CM | POA: Diagnosis not present

## 2020-03-11 MED ORDER — DAPAGLIFLOZIN PROPANEDIOL 5 MG PO TABS
10.0000 mg | ORAL_TABLET | Freq: Every day | ORAL | 3 refills | Status: DC
Start: 2020-03-11 — End: 2020-03-19

## 2020-03-11 NOTE — Progress Notes (Signed)
Cardiology Office Note:    Date:  03/11/2020   ID:  Nichole [name], DOB [birth year], MRN [id number]  PCP:  Patient, No Pcp Per  Cardiologist:  No primary care provider on file.  Electrophysiologist:  None   Referring MD: No ref. provider found   Chief Complaint: HFrEF  History of Present Illness:    Nichole [name] is a 64 y.o. female with a history of chronic systolic congestive heart failure, atrial fibrillation and sick sinus syndrome. She had a pacemaker placed in September 2011. After she had nonsustained ventricular tachycardia and syncope she had a subsequent upgrade to a biventricular ICD in March 2013. She also has coronary artery disease with a stent placed to the LAD. Her most recent cardiac catheterization was in 2013 and showed a widely patent proximal LAD stent, 50% left circumflex stenosis and a 20% mid RCA stenosis, ejection fraction 25%. She also has a history of essential hypertension, hypothyroidism, and chronic kidney disease. She underwent ICD gen change 10/2017 .   She is here to establish care, however, she plans to relocate again soon to be with a different family member.   She is SOB with bathing. No CP. Her affect is quite flat today. We discussed in detail whether she could be depressed as she is unable to answer many of the questions I ask today. History is provided by family on the phone in in the room (son and daughter respectively).   Has recently been seen in ED with seizers.   Endorses snoring.   Known to have chronic systolic HF, last EF felt to be 50% in 2016, more recently echo showed EF 35-40%.   Past Medical History:  Diagnosis Date   CHF (congestive heart failure) (HCC)    Diabetes mellitus without complication (HCC)    Hypertension    Hypothyroidism    Paroxysmal atrial fibrillation (HCC)    Seizures (HCC)     Past Surgical History:  Procedure Laterality Date   PACEMAKER IMPLANT      Current Medications: Current Meds  Medication  Sig   Cholecalciferol (VITAMIN D3) 1.25 MG (50000 UT) CAPS Take 50,000 Units by mouth every Friday.   furosemide (LASIX) 40 MG tablet Take 40 mg by mouth daily.   glimepiride (AMARYL) 4 MG tablet Take 4 mg by mouth daily with breakfast.    hydrALAZINE (APRESOLINE) 100 MG tablet Take 100 mg by mouth 3 (three) times daily.   insulin glargine (LANTUS SOLOSTAR) 100 UNIT/ML Solostar Pen Inject 30 Units into the skin 2 (two) times daily.   levETIRAcetam 750 MG TB24 Take 1 tablet (750 mg total) by mouth 2 (two) times daily.   levothyroxine (SYNTHROID) 125 MCG tablet Take 125 mcg by mouth every morning.   metFORMIN (GLUCOPHAGE) 1000 MG tablet Take 1,000 mg by mouth 2 (two) times daily.   mirabegron ER (MYRBETRIQ) 25 MG TB24 tablet Take 25 mg by mouth daily.   ramipril (ALTACE) 5 MG capsule Take 5 mg by mouth daily.    warfarin (COUMADIN) 6 MG tablet Take 6 mg by mouth every evening.     Allergies:   Patient has no known allergies.   Social History   Tobacco Use   Smoking status: Former Smoker    Packs/day: 0.25    Quit date: 02/06/1999    Years since quitting: 21.1   Smokeless tobacco: Never Used  Substance Use Topics   Alcohol use: Never   Drug use: Never     Family History: The patient's family  history is not on file.  ROS:   Please see the history of present illness.    All other systems reviewed and are negative.  EKGs/Labs/Other Studies Reviewed:    The following studies were reviewed today:  EKG:  AV paced, PVC  Recent Labs: 02/03/2020: TSH 12.202 02/07/2020: ALT 19; Magnesium 1.5 03/09/2020: BUN 43; Creatinine, Ser 1.65; Hemoglobin 13.1; Platelets 339; Potassium 5.2; Sodium 139  Recent Lipid Panel    Component Value Date/Time   CHOL 194 02/05/2020 0510   TRIG 135 02/05/2020 0510   HDL 47 02/05/2020 0510   CHOLHDL 4.1 02/05/2020 0510   VLDL 27 02/05/2020 0510   LDLCALC 120 (H) 02/05/2020 0510    Physical Exam:    VS:  BP (!) 144/82    Pulse 70     Temp (!) 97.1 F (36.2 C)    Ht 5' (1.524 m)    Wt 146 lb (66.2 kg)    SpO2 95%    BMI 28.51 kg/m     Wt Readings from Last 5 Encounters:  03/11/20 146 lb (66.2 kg)  02/14/20 160 lb (72.6 kg)  02/07/20 160 lb 11.5 oz (72.9 kg)     Constitutional: No acute distress Eyes: sclera non-icteric, normal conjunctiva and lids ENMT: normal dentition, moist mucous membranes Cardiovascular: regular rhythm, normal rate, no murmurs. S1 and S2 normal. Radial pulses normal bilaterally. No jugular venous distention.  Respiratory: clear to auscultation bilaterally GI : normal bowel sounds, soft and nontender. No distention.   MSK: extremities warm, well perfused. Mild bilateral edema.  NEURO: grossly nonfocal exam, moves all extremities. PSYCH: affect flat, patient does not verbalize much, unable to gauge orientation.  ASSESSMENT:    1. Chronic systolic CHF (congestive heart failure) (HCC)   2. Paroxysmal atrial fibrillation (HCC)   3. Essential hypertension   4. Coronary artery disease involving native coronary artery of native heart without angina pectoris    PLAN:    Chronic systolic CHF (congestive heart failure) (HCC) - Plan: EKG 12-Lead,   AMB Referral to [facility] to start entresto - review cost and follow up.  Heart Failure Therapy ACE-I/ARB/ARNI: ramipril, transition to entresto BB: none currently MRA: none currently SGLT2I: will start farxiga 10 mg daily today Diuretic plan: continue lasix 40 mg daily   Paroxysmal atrial fibrillation (HCC) -AV paced today. - Continue warfarin for [facility]  Essential hypertension - Plan: EKG 12-Lead, AMB Referral to [facility] -ramipril and lasix, BP elevated, will need to titrate HF therapy, follow BP closely.  Coronary artery disease involving native coronary artery of native heart without angina pectoris - Eliquis, statin. Will start BB as part of HF therapy, has pacemaker.  Total time of encounter: 45 minutes total time of  encounter, including 30 minutes spent in face-to-face patient care on the date of this encounter. This time includes coordination of care and counseling regarding above mentioned problem list. Remainder of non-face-to-face time involved reviewing chart documents/testing relevant to the patient encounter and documentation in the medical record. I have independently reviewed documentation from referring provider.   Weston Brass, MD [place]   [facility]    Medication Adjustments/Labs and Tests Ordered: Current medicines are reviewed at length with the patient today.  Concerns regarding medicines are outlined above.  Orders Placed This Encounter  Procedures   AMB Referral to [facility]   EKG 12-Lead   Meds ordered this encounter  Medications   DISCONTD: dapagliflozin propanediol (FARXIGA) 5 MG TABS tablet  Sig: Take 2 tablets (10 mg total) by mouth daily.    Dispense:  30 tablet    Refill:  3    Patient Instructions  Medication Instructions:  Please START Farxiga 10 mg daily (2Tablets daily)   *If you need a refill on your cardiac medications before your next appointment, please call your pharmacy*  Lab Work: None Ordered At This Time.   If you have labs (blood work) drawn today and your tests are completely normal, you will receive your results only by:  MyChart Message (if you have MyChart) OR  A paper copy in the mail If you have any lab test that is abnormal or we need to change your treatment, we will call you to review the results.  Testing/Procedures: None Ordered At This Time.   Follow-Up: At [facility], you and your health needs are our priority.  As part of our continuing mission to provide you with exceptional heart care, we have created designated Provider Care Teams.  These Care Teams include your primary Cardiologist (physician) and Advanced Practice Providers (APPs -  Physician Assistants and Nurse Practitioners) who all work together to  provide you with the care you need, when you need it.  We recommend signing up for the patient portal called "MyChart".  Sign up information is provided on this After Visit Summary.  MyChart is used to connect with patients for Virtual Visits (Telemedicine).  Patients are able to view lab/test results, encounter notes, upcoming appointments, etc.  Non-urgent messages can be sent to your provider as well.   To learn more about what you can do with MyChart, go to [email or web address].    Your next appointment:   3 month(s)  The format for your next appointment:   In Person  Provider:   Weston Brass, MD  Other Instructions  You are being referred to [facility] which is our pharmacists located here at the [facility] office for medication management. This will be for heart failure medication management. Please schedule Appointment at next available

## 2020-03-11 NOTE — Patient Instructions (Addendum)
Medication Instructions:  Please START Farxiga 10 mg daily (2Tablets daily)   *If you need a refill on your cardiac medications before your next appointment, please call your pharmacy*  Lab Work: None Ordered At This Time.   If you have labs (blood work) drawn today and your tests are completely normal, you will receive your results only by: Marland Kitchen MyChart Message (if you have MyChart) OR . A paper copy in the mail If you have any lab test that is abnormal or we need to change your treatment, we will call you to review the results.  Testing/Procedures: None Ordered At This Time.   Follow-Up: At [facility], you and your health needs are our priority.  As part of our continuing mission to provide you with exceptional heart care, we have created designated Provider Care Teams.  These Care Teams include your primary Cardiologist (physician) and Advanced Practice Providers (APPs -  Physician Assistants and Nurse Practitioners) who all work together to provide you with the care you need, when you need it.  We recommend signing up for the patient portal called "MyChart".  Sign up information is provided on this After Visit Summary.  MyChart is used to connect with patients for Virtual Visits (Telemedicine).  Patients are able to view lab/test results, encounter notes, upcoming appointments, etc.  Non-urgent messages can be sent to your provider as well.   To learn more about what you can do with MyChart, go to [email or web address].    Your next appointment:   3 month(s)  The format for your next appointment:   In Person  Provider:   Weston Brass, MD  Other Instructions  You are being referred to [facility] which is our pharmacists located here at the [facility] office for medication management. This will be for heart failure medication management. Please schedule Appointment at next available

## 2020-03-14 ENCOUNTER — Inpatient Hospital Stay (HOSPITAL_COMMUNITY)
Admission: EM | Admit: 2020-03-14 | Discharge: 2020-03-19 | DRG: 682 | Disposition: A | Discharge status: Home Health | Payer: Medicare Other | Attending: Family Medicine | Admitting: Family Medicine

## 2020-03-14 ENCOUNTER — Encounter (HOSPITAL_COMMUNITY): Payer: Self-pay

## 2020-03-14 ENCOUNTER — Emergency Department (HOSPITAL_COMMUNITY): Payer: Medicare Other

## 2020-03-14 ENCOUNTER — Other Ambulatory Visit: Payer: Self-pay

## 2020-03-14 DIAGNOSIS — E876 Hypokalemia: Secondary | ICD-10-CM | POA: Diagnosis not present

## 2020-03-14 DIAGNOSIS — Z950 Presence of cardiac pacemaker: Secondary | ICD-10-CM

## 2020-03-14 DIAGNOSIS — Z7189 Other specified counseling: Secondary | ICD-10-CM

## 2020-03-14 DIAGNOSIS — T68XXXA Hypothermia, initial encounter: Secondary | ICD-10-CM | POA: Diagnosis present

## 2020-03-14 DIAGNOSIS — E039 Hypothyroidism, unspecified: Secondary | ICD-10-CM | POA: Diagnosis present

## 2020-03-14 DIAGNOSIS — Z79899 Other long term (current) drug therapy: Secondary | ICD-10-CM

## 2020-03-14 DIAGNOSIS — Z66 Do not resuscitate: Secondary | ICD-10-CM | POA: Diagnosis present

## 2020-03-14 DIAGNOSIS — Z794 Long term (current) use of insulin: Secondary | ICD-10-CM

## 2020-03-14 DIAGNOSIS — N181 Chronic kidney disease, stage 1: Secondary | ICD-10-CM

## 2020-03-14 DIAGNOSIS — G40909 Epilepsy, unspecified, not intractable, without status epilepticus: Secondary | ICD-10-CM | POA: Diagnosis present

## 2020-03-14 DIAGNOSIS — Z789 Other specified health status: Secondary | ICD-10-CM

## 2020-03-14 DIAGNOSIS — I5022 Chronic systolic (congestive) heart failure: Secondary | ICD-10-CM

## 2020-03-14 DIAGNOSIS — R7989 Other specified abnormal findings of blood chemistry: Secondary | ICD-10-CM | POA: Diagnosis not present

## 2020-03-14 DIAGNOSIS — T383X5A Adverse effect of insulin and oral hypoglycemic [antidiabetic] drugs, initial encounter: Secondary | ICD-10-CM | POA: Diagnosis present

## 2020-03-14 DIAGNOSIS — Z7901 Long term (current) use of anticoagulants: Secondary | ICD-10-CM

## 2020-03-14 DIAGNOSIS — R627 Adult failure to thrive: Secondary | ICD-10-CM | POA: Diagnosis present

## 2020-03-14 DIAGNOSIS — N19 Unspecified kidney failure: Secondary | ICD-10-CM | POA: Diagnosis present

## 2020-03-14 DIAGNOSIS — Z8673 Personal history of transient ischemic attack (TIA), and cerebral infarction without residual deficits: Secondary | ICD-10-CM

## 2020-03-14 DIAGNOSIS — G92 Toxic encephalopathy: Secondary | ICD-10-CM | POA: Diagnosis present

## 2020-03-14 DIAGNOSIS — E2740 Unspecified adrenocortical insufficiency: Secondary | ICD-10-CM | POA: Diagnosis present

## 2020-03-14 DIAGNOSIS — I130 Hypertensive heart and chronic kidney disease with heart failure and stage 1 through stage 4 chronic kidney disease, or unspecified chronic kidney disease: Secondary | ICD-10-CM | POA: Diagnosis present

## 2020-03-14 DIAGNOSIS — I700 Atherosclerosis of aorta: Secondary | ICD-10-CM | POA: Diagnosis present

## 2020-03-14 DIAGNOSIS — E875 Hyperkalemia: Secondary | ICD-10-CM | POA: Diagnosis not present

## 2020-03-14 DIAGNOSIS — R1310 Dysphagia, unspecified: Secondary | ICD-10-CM | POA: Diagnosis present

## 2020-03-14 DIAGNOSIS — Z20822 Contact with and (suspected) exposure to covid-19: Secondary | ICD-10-CM | POA: Diagnosis present

## 2020-03-14 DIAGNOSIS — I5042 Chronic combined systolic (congestive) and diastolic (congestive) heart failure: Secondary | ICD-10-CM | POA: Diagnosis present

## 2020-03-14 DIAGNOSIS — M6208 Separation of muscle (nontraumatic), other site: Secondary | ICD-10-CM | POA: Diagnosis present

## 2020-03-14 DIAGNOSIS — N179 Acute kidney failure, unspecified: Principal | ICD-10-CM

## 2020-03-14 DIAGNOSIS — M545 Low back pain: Secondary | ICD-10-CM | POA: Diagnosis present

## 2020-03-14 DIAGNOSIS — I959 Hypotension, unspecified: Secondary | ICD-10-CM | POA: Diagnosis present

## 2020-03-14 DIAGNOSIS — E872 Acidosis: Secondary | ICD-10-CM | POA: Diagnosis present

## 2020-03-14 DIAGNOSIS — E0822 Diabetes mellitus due to underlying condition with diabetic chronic kidney disease: Secondary | ICD-10-CM

## 2020-03-14 DIAGNOSIS — Z87891 Personal history of nicotine dependence: Secondary | ICD-10-CM

## 2020-03-14 DIAGNOSIS — Z79890 Hormone replacement therapy (postmenopausal): Secondary | ICD-10-CM

## 2020-03-14 DIAGNOSIS — N189 Chronic kidney disease, unspecified: Secondary | ICD-10-CM | POA: Diagnosis present

## 2020-03-14 DIAGNOSIS — Z6830 Body mass index (BMI) 30.0-30.9, adult: Secondary | ICD-10-CM

## 2020-03-14 DIAGNOSIS — E162 Hypoglycemia, unspecified: Secondary | ICD-10-CM

## 2020-03-14 DIAGNOSIS — E1122 Type 2 diabetes mellitus with diabetic chronic kidney disease: Secondary | ICD-10-CM | POA: Diagnosis present

## 2020-03-14 DIAGNOSIS — I480 Paroxysmal atrial fibrillation: Secondary | ICD-10-CM | POA: Diagnosis present

## 2020-03-14 DIAGNOSIS — E11649 Type 2 diabetes mellitus with hypoglycemia without coma: Secondary | ICD-10-CM | POA: Diagnosis present

## 2020-03-14 HISTORY — DX: Other specified abnormal findings of blood chemistry: R79.89

## 2020-03-14 LAB — URINALYSIS, ROUTINE W REFLEX MICROSCOPIC
Bilirubin Urine: NEGATIVE
Glucose, UA: NEGATIVE mg/dL
Hgb urine dipstick: NEGATIVE
Ketones, ur: NEGATIVE mg/dL
Leukocytes,Ua: NEGATIVE
Nitrite: NEGATIVE
Protein, ur: 30 mg/dL — AB
Specific Gravity, Urine: 1.016 (ref 1.005–1.030)
pH: 5 (ref 5.0–8.0)

## 2020-03-14 LAB — I-STAT VENOUS BLOOD GAS, ED
Acid-base deficit: 15 mmol/L — ABNORMAL HIGH (ref 0.0–2.0)
Bicarbonate: 14.3 mmol/L — ABNORMAL LOW (ref 20.0–28.0)
Calcium, Ion: 1.21 mmol/L (ref 1.15–1.40)
HCT: 40 % (ref 36.0–46.0)
Hemoglobin: 13.6 g/dL (ref 12.0–15.0)
O2 Saturation: 98 %
Potassium: 5.5 mmol/L — ABNORMAL HIGH (ref 3.5–5.1)
Sodium: 136 mmol/L (ref 135–145)
TCO2: 16 mmol/L — ABNORMAL LOW (ref 22–32)
pCO2, Ven: 43.5 mmHg — ABNORMAL LOW (ref 44.0–60.0)
pH, Ven: 7.124 — CL (ref 7.250–7.430)
pO2, Ven: 146 mmHg — ABNORMAL HIGH (ref 32.0–45.0)

## 2020-03-14 LAB — CBC WITH DIFFERENTIAL/PLATELET
Basophils Absolute: 0 10*3/uL (ref 0.0–0.1)
Basophils Relative: 0 %
Eosinophils Absolute: 0.1 10*3/uL (ref 0.0–0.5)
Eosinophils Relative: 1 %
HCT: 40 % (ref 36.0–46.0)
Hemoglobin: 12.3 g/dL (ref 12.0–15.0)
Lymphocytes Relative: 16 %
Lymphs Abs: 1.7 10*3/uL (ref 0.7–4.0)
MCH: 28.3 pg (ref 26.0–34.0)
MCHC: 30.8 g/dL (ref 30.0–36.0)
MCV: 92.2 fL (ref 80.0–100.0)
Monocytes Absolute: 0.5 10*3/uL (ref 0.1–1.0)
Monocytes Relative: 5 %
Neutro Abs: 8.1 10*3/uL — ABNORMAL HIGH (ref 1.7–7.7)
Neutrophils Relative %: 78 %
Platelets: 270 10*3/uL (ref 150–400)
RBC: 4.34 MIL/uL (ref 3.87–5.11)
RDW: 12.6 % (ref 11.5–15.5)
WBC: 10.3 10*3/uL (ref 4.0–10.5)

## 2020-03-14 LAB — COMPREHENSIVE METABOLIC PANEL
ALT: 21 U/L (ref 0–44)
AST: 18 U/L (ref 15–41)
Albumin: 4.1 g/dL (ref 3.5–5.0)
Alkaline Phosphatase: 41 U/L (ref 38–126)
Anion gap: 16 — ABNORMAL HIGH (ref 5–15)
BUN: 88 mg/dL — ABNORMAL HIGH (ref 8–23)
CO2: 14 mmol/L — ABNORMAL LOW (ref 22–32)
Calcium: 9.7 mg/dL (ref 8.9–10.3)
Chloride: 104 mmol/L (ref 98–111)
Creatinine, Ser: 6.71 mg/dL — ABNORMAL HIGH (ref 0.44–1.00)
GFR calc Af Amer: 7 mL/min — ABNORMAL LOW (ref >60)
GFR calc non Af Amer: 6 mL/min — ABNORMAL LOW (ref >60)
Glucose, Bld: 108 mg/dL — ABNORMAL HIGH (ref 70–99)
Potassium: 5.4 mmol/L — ABNORMAL HIGH (ref 3.5–5.1)
Sodium: 134 mmol/L — ABNORMAL LOW (ref 135–145)
Total Bilirubin: 0.5 mg/dL (ref 0.3–1.2)
Total Protein: 7.8 g/dL (ref 6.5–8.1)

## 2020-03-14 LAB — CBG MONITORING, ED
Glucose-Capillary: 128 mg/dL — ABNORMAL HIGH (ref 70–99)
Glucose-Capillary: 49 mg/dL — ABNORMAL LOW (ref 70–99)
Glucose-Capillary: 125 mg/dL — ABNORMAL HIGH (ref 70–99)

## 2020-03-14 LAB — PROTIME-INR
INR: 1.6 — ABNORMAL HIGH (ref 0.8–1.2)
Prothrombin Time: 18.5 seconds — ABNORMAL HIGH (ref 11.4–15.2)

## 2020-03-14 LAB — LACTIC ACID, PLASMA
Lactic Acid, Venous: 2.9 mmol/L (ref 0.5–1.9)
Lactic Acid, Venous: 3.7 mmol/L (ref 0.5–1.9)

## 2020-03-14 LAB — SARS CORONAVIRUS 2 BY RT PCR (HOSPITAL ORDER, PERFORMED IN ~~LOC~~ HOSPITAL LAB): SARS Coronavirus 2: NEGATIVE

## 2020-03-14 MED ORDER — DEXTROSE 10 % IV SOLN: INTRAVENOUS | Status: DC

## 2020-03-14 MED ORDER — DEXTROSE 50 % IV SOLN
INTRAVENOUS | Status: AC
  Administered 2020-03-14: 50 mL via INTRAVENOUS
  Filled 2020-03-14: qty 50

## 2020-03-14 MED ORDER — SODIUM CHLORIDE 0.9 % IV BOLUS
1000.0000 mL | Freq: Once | INTRAVENOUS | Status: AC
  Administered 2020-03-15: 1000 mL via INTRAVENOUS

## 2020-03-14 MED ORDER — SODIUM CHLORIDE 0.9 % IV BOLUS: 1000.0000 mL | Freq: Once | INTRAVENOUS | Status: DC

## 2020-03-14 MED ORDER — SODIUM CHLORIDE 0.9 % IV BOLUS
1000.0000 mL | Freq: Once | INTRAVENOUS | Status: AC
  Administered 2020-03-14: 1000 mL via INTRAVENOUS

## 2020-03-14 MED ORDER — DEXTROSE 50 % IV SOLN: 1.0000 | Freq: Once | INTRAVENOUS | Status: AC

## 2020-03-14 MED ORDER — SODIUM BICARBONATE 8.4 % IV SOLN
50.0000 meq | Freq: Once | INTRAVENOUS | Status: AC
  Administered 2020-03-14: 50 meq via INTRAVENOUS
  Filled 2020-03-14: qty 50

## 2020-03-14 NOTE — ED Notes (Signed)
Pt transported to xray 

## 2020-03-14 NOTE — ED Notes (Signed)
Brief changed ?

## 2020-03-14 NOTE — H&P (Signed)
NAME:  Nichole [name], MRN:  [id number], DOB:  1956/05/09, LOS: 0 ADMISSION DATE:  03/14/2020, CONSULTATION DATE:  03/14/20 REFERRING MD:  EDP, CHIEF COMPLAINT:  AMS   Brief History   64 y.o. F with past medical history of HFrEF, diabetes, hypertension, atrial fibrillation on Coumadin and seizure disorder who was brought in for altered mental status.  Family noticed increased lethargy with possible absence seizure's.  Work-up significant for worsening renal function, hypoglycemia and lactic acidosis with imaging studies pending.  PCCM consulted for admission  History of present illness   Nichole [name] is a 64 year old female with PMH of HFrEF, diabetes, hypertension, atrial fibrillation on Coumadin and seizure disorder on Keppra.  She was noted to be more lethargic and altered by family, EMS was called and found patient's glucose to be 38 with possible absence seizure.  Mental status improved with D50.  In the ED, patient's creatinine was 6.71 above recent baseline of 1.3-1.6 with potassium of 5.4 and anion gap metabolic acidosis and lactic acid of 2.9.  No obvious sources of infection, CT head and abdomen/pelvis pending.  She was given IV fluids and D10 and PCCM consulted for admission  Past Medical History   has a past medical history of CHF (congestive heart failure) (HCC), Diabetes mellitus without complication (HCC), Hypertension, Hypothyroidism, Paroxysmal atrial fibrillation (HCC), and Seizures (HCC).   Significant Hospital Events   9/2 Admit to PCCM  Consults:    Procedures:    Significant Diagnostic Tests:  Head CT neg CT renal stone: neg  CXR: no infiltrate, The patient's images have been independently reviewed by me.    July 2021: ECHO IMPRESSIONS  1. Left ventricular ejection fraction, by estimation, is 35 to 40%. The  left ventricle has moderately decreased function. The left ventricle  demonstrates global hypokinesis. Left ventricular diastolic parameters are   consistent with Grade I diastolic  dysfunction (impaired relaxation).  2. Right ventricular systolic function is mildly reduced. The right  ventricular size is mildly enlarged. There is normal pulmonary artery  systolic pressure. The estimated right ventricular systolic pressure is  25.1 mmHg.  3. The mitral valve is grossly normal. Mild mitral valve regurgitation.  No evidence of mitral stenosis.  4. The aortic valve is tricuspid. Aortic valve regurgitation is not  visualized. No aortic stenosis is present.  5. The inferior vena cava is normal in size with greater than 50%  respiratory variability, suggesting right atrial pressure of 3 mmHg.   Micro Data:  COVID neg BCx: pending  UCx: pending   Antimicrobials:  Ceftriaxone  Azithromycin   Interim history/subjective:  Per HPI above   Objective   Blood pressure 100/69, pulse 62, temperature 97.7 F (36.5 C), temperature source Oral, resp. rate 20, SpO2 98 %.       No intake or output data in the 24 hours ending 03/14/20 2030 There were no vitals filed for this visit.  Examination: General: elderly FM, resting in bed, confused, slow responses  HENT: Tracking, ncat Lungs: CTAB, no crackles, no wheeze  Cardiovascular: RRR, s1 s2  Abdomen: soft, nt nd  Extremities: no edema  Neuro: alert to person, unknown to location, moves all 4 extremities  GU: deferred   Resolved Hospital Problem list     Assessment & Plan:   Acute renal failure, uremia  Metabolic Acidosis  Acute metabolic encephalopathy  Hyperkalemia, related to renal failure and acidosis   - new onset renal failure  - no blood in urine, ct renal study with  no obstruction  - was on ACE(-) prior to admission, and lasix  - will check urine lytes - nephrology has already been consulted  - clinically she looks dry on exam, no LE edema, no jvd  - follow UOP and recheck BMP  - she has had a generous amount if IVFs in the ED. I hold any more for the time  being   Chronic systolic heart failure  - prior EF 30-40%  - holding nephrotoxic meds, holding ace(-)   Hypotension  Hypoglycemia  Possible sepsis  >>> Associated with hypothermia, renal failure, hypoglycemia  P: Cultures pending  Start ceftriaxone and azithro  Ct with mild bladder thickening  Previous Ucx with aerococcus in July 2021  - d10 infusion  - check cortisol level  - will give hydrocortisone empirically, can taper soon I presume   PAF  On coumadin  May need dialysis access Hold pta coumadin  Start heparin ggt   Seizure disorder  Restart home keppra   Best practice:  Diet: NPO  Pain/Anxiety/Delirium protocol (if indicated): NA  VAP protocol (if indicated): NA DVT prophylaxis: heparin  GI prophylaxis: ppi  Glucose control: d10  Mobility: BR  Code Status: FULL Family Communication:  Disposition: ICU   Labs   CBC: Recent Labs  Lab 03/09/20 1403 03/14/20 1745  WBC 10.8* 10.3  NEUTROABS 7.8* 8.1*  HGB 13.1 12.3  HCT 41.3 40.0  MCV 91.4 92.2  PLT 339 270    Basic Metabolic Panel: Recent Labs  Lab 03/09/20 1403 03/14/20 1745  NA 139 134*  K 5.2* 5.4*  CL 107 104  CO2 20* 14*  GLUCOSE 177* 108*  BUN 43* 88*  CREATININE 1.65* 6.71*  CALCIUM 10.6* 9.7   GFR: Estimated Creatinine Clearance: 7.2 mL/min (A) (by C-G formula based on SCr of 6.71 mg/dL (H)). Recent Labs  Lab 03/09/20 1403 03/14/20 1745 03/14/20 1746  WBC 10.8* 10.3  --   LATICACIDVEN  --   --  2.9*    Liver Function Tests: Recent Labs  Lab 03/14/20 1745  AST 18  ALT 21  ALKPHOS 41  BILITOT 0.5  PROT 7.8  ALBUMIN 4.1   No results for input(s): LIPASE, AMYLASE in the last 168 hours. No results for input(s): AMMONIA in the last 168 hours.  ABG No results found for: PHART, PCO2ART, PO2ART, HCO3, TCO2, ACIDBASEDEF, O2SAT   Coagulation Profile: Recent Labs  Lab 03/09/20 1645 03/14/20 1745  INR 1.3* 1.6*    Cardiac Enzymes: No results for input(s): CKTOTAL,  CKMB, CKMBINDEX, TROPONINI in the last 168 hours.  HbA1C: Hgb A1c MFr Bld  Date/Time Value Ref Range Status  02/04/2020 06:05 AM 12.9 (H) 4.8 - 5.6 % Final    Comment:    (NOTE) Pre diabetes:          5.7%-6.4%  Diabetes:              >6.4%  Glycemic control for   <7.0% adults with diabetes     CBG: Recent Labs  Lab 03/14/20 1739 03/14/20 1924  GLUCAP 128* 49*    Review of Systems:   Unable to be obtained. Patients encephalopathy   Past Medical History  She,  has a past medical history of CHF (congestive heart failure) (HCC), Diabetes mellitus without complication (HCC), Hypertension, Hypothyroidism, Paroxysmal atrial fibrillation (HCC), and Seizures (HCC).   Surgical History    Past Surgical History:  Procedure Laterality Date  . PACEMAKER IMPLANT       Social History  reports that she quit smoking about 21 years ago. She smoked 0.25 packs per day. She has never used smokeless tobacco. She reports that she does not drink alcohol and does not use drugs.   Family History   Her family history is not on file.   Allergies No Known Allergies   Home Medications  Prior to Admission medications   Medication Sig Start Date End Date Taking? Authorizing Provider  atorvastatin (LIPITOR) 20 MG tablet Take 1 tablet (20 mg total) by mouth daily. 02/07/20 03/08/20  Arrien, York Ram, MD  carvedilol (COREG) 12.5 MG tablet Take 12.5 mg by mouth in the morning and at bedtime. 07/15/18 03/08/20  [provider]  Cholecalciferol (VITAMIN D3) 1.25 MG (50000 UT) CAPS Take 50,000 Units by mouth every Friday. 01/10/20   [provider]  dapagliflozin propanediol (FARXIGA) 5 MG TABS tablet Take 2 tablets (10 mg total) by mouth daily. 03/11/20   Parke Poisson, MD  furosemide (LASIX) 40 MG tablet Take 40 mg by mouth daily. 01/10/20   [provider]  glimepiride (AMARYL) 4 MG tablet Take 4 mg by mouth daily with breakfast.  01/10/20   [provider]   hydrALAZINE (APRESOLINE) 100 MG tablet Take 100 mg by mouth 3 (three) times daily. 01/12/20   [provider]  insulin glargine (LANTUS SOLOSTAR) 100 UNIT/ML Solostar Pen Inject 30 Units into the skin 2 (two) times daily.    [provider]  levETIRAcetam 750 MG TB24 Take 1 tablet (750 mg total) by mouth 2 (two) times daily. 02/14/20 03/15/20  Joy, Hillard Danker, PA-C  levothyroxine (SYNTHROID) 125 MCG tablet Take 125 mcg by mouth every morning. 09/06/19   [provider]  metFORMIN (GLUCOPHAGE) 1000 MG tablet Take 1,000 mg by mouth 2 (two) times daily. 01/10/20   [provider]  mirabegron ER (MYRBETRIQ) 25 MG TB24 tablet Take 25 mg by mouth daily.    [provider]  ramipril (ALTACE) 5 MG capsule Take 5 mg by mouth daily.  01/10/20   [provider]  warfarin (COUMADIN) 6 MG tablet Take 6 mg by mouth every evening. 01/10/20   [provider]      This patient is critically ill with multiple organ system failure; which, requires frequent high complexity decision making, assessment, support, evaluation, and titration of therapies. This was completed through the application of advanced monitoring technologies and extensive interpretation of multiple databases. During this encounter critical care time was devoted to patient care services described in this note for 33 minutes.  Josephine Igo, DO [facility] 03/15/2020 1:52 AM

## 2020-03-14 NOTE — ED Triage Notes (Signed)
Pt from home via ems; called out for AMS; CBG 38; 25 grams d10 given, CBG 136; mental status improved; EMS also reports pt has been having absent seizures; pt recently admitted to ICU in [place] per family member, unsure what for exactly; reports pt in early stages of renal failure; pt c/o back pain; no changes in urinary status; no obvious source of infection; per family, pt has been cold, lethargic last few days; paced rhythm 134/78, 90s, RR 22, end tidal 25, 100% RA, 97.67F; equal grip strength, seems generally weak per family; answers questions appropriately, follows commands

## 2020-03-14 NOTE — ED Provider Notes (Addendum)
[facility] EMERGENCY DEPARTMENT Provider Note   CSN: [id number] Arrival date & time: 03/14/20  1721    History AMS  Nichole [name] is a 64 y.o. female with past medical history significant for CHF, diabetes, hypertension, A. Fib, seizures, CVA on warfarin who presents for evaluation of change in responsiveness.  Patient's daughter states around 2-3:00 today patient was up and walking around.  She said she did not feel good.  States she went to lay down.  She tried to get up and said she could not get up because her back was hurting her.  Her daughter went into the room and patient seemed lethargic, could not speak.  She did not notice a facial droop.  Patient states that patient could not move bilateral extremities.  She was concerned about a stroke given patient did not move and could not speak.  Daughter did not notice any urinary or fecal incontinence.  She had no seizure-like activity.  EMS did note the patient had multiple episodes of "staring off into space."  She was answering questions appropriately and following commands with EMS prior to arrival.  Patient denies any urinary complaints.  No fever, chills, nausea, vomiting, chest pain, shortness of breath, abdominal pain, diarrhea, dysuria.  She denies any swelling to her legs.  States she has generalized low back pain.  Does not radiate into legs or arms. No recent injury or trauma.  Denies additional aggravating or  alleviating factors.  History obtained from patient and past medical records. No interpretor was used.   Collateral from patient's daughter Essie Christine at [phone]   HPI     Past Medical History:  Diagnosis Date  . CHF (congestive heart failure) (HCC)   . Diabetes mellitus without complication (HCC)   . Hypertension   . Hypothyroidism   . Paroxysmal atrial fibrillation (HCC)   . Seizures [facility])     Patient Active Problem List   Diagnosis Date Noted  . Unspecified atrial fibrillation  (HCC) 02/04/2020  . History of cardioembolic cerebrovascular accident (CVA) 02/04/2020  . Chronic systolic CHF (congestive heart failure) (HCC) 02/04/2020  . CAD (coronary artery disease) 02/04/2020  . Essential hypertension 02/04/2020  . Dyslipidemia 02/04/2020  . Hypothyroidism 02/04/2020  . Seizure (HCC) 02/03/2020    Past Surgical History:  Procedure Laterality Date  . PACEMAKER IMPLANT       OB History   No obstetric history on file.     No family history on file.  Social History   Tobacco Use  . Smoking status: Former Smoker    Packs/day: 0.25    Quit date: 02/06/1999    Years since quitting: 21.1  . Smokeless tobacco: Never Used  Substance Use Topics  . Alcohol use: Never  . Drug use: Never    Home Medications Prior to Admission medications   Medication Sig Start Date End Date Taking? Authorizing Provider  carvedilol (COREG) 12.5 MG tablet Take 12.5 mg by mouth in the morning and at bedtime. 07/15/18 03/14/20 Yes [provider]  Cholecalciferol (VITAMIN D3) 1.25 MG (50000 UT) CAPS Take 50,000 Units by mouth every Friday. 01/10/20  Yes [provider]  furosemide (LASIX) 40 MG tablet Take 40 mg by mouth daily. 01/10/20  Yes [provider]  glimepiride (AMARYL) 4 MG tablet Take 4 mg by mouth daily with breakfast.  01/10/20  Yes [provider]  hydrALAZINE (APRESOLINE) 100 MG tablet Take 100 mg by mouth 3 (three) times daily. 01/12/20  Yes [provider]  insulin glargine (LANTUS SOLOSTAR) 100 UNIT/ML Solostar Pen Inject 30 Units into the skin 2 (two) times daily.   Yes [provider]  levETIRAcetam 750 MG TB24 Take 1 tablet (750 mg total) by mouth 2 (two) times daily. 02/14/20 03/15/20 Yes Joy, Shawn C, PA-C  levothyroxine (SYNTHROID) 125 MCG tablet Take 125 mcg by mouth every morning. 09/06/19  Yes [provider]  metFORMIN (GLUCOPHAGE) 1000 MG tablet Take 1,000 mg by mouth 2 (two) times daily. 01/10/20  Yes  [provider]  mirabegron ER (MYRBETRIQ) 25 MG TB24 tablet Take 25 mg by mouth daily.   Yes [provider]  ramipril (ALTACE) 5 MG capsule Take 5 mg by mouth daily.  01/10/20  Yes [provider]  warfarin (COUMADIN) 6 MG tablet Take 6 mg by mouth every evening. 01/10/20  Yes [provider]  atorvastatin (LIPITOR) 20 MG tablet Take 1 tablet (20 mg total) by mouth daily. 02/07/20 03/08/20  Arrien, York Ram, MD  dapagliflozin propanediol (FARXIGA) 5 MG TABS tablet Take 2 tablets (10 mg total) by mouth daily. Patient not taking: Reported on 03/14/2020 03/11/20   Parke Poisson, MD    Allergies    Patient has no known allergies.  Review of Systems   Review of Systems  Constitutional: Positive for activity change, appetite change and fatigue.  HENT: Negative.   Respiratory: Negative.   Cardiovascular: Negative.   Gastrointestinal: Negative.   Musculoskeletal: Positive for back pain.  Skin: Negative.   Neurological: Positive for weakness (Generalized). Negative for dizziness, tremors, seizures, syncope, facial asymmetry, speech difficulty, light-headedness, numbness and headaches.  All other systems reviewed and are negative.   Physical Exam Updated Vital Signs BP 91/68   Pulse 79   Temp 97.7 F (36.5 C) (Oral)   Resp (!) 25   Ht 5' (1.524 m)   Wt 66 kg   SpO2 97%   BMI 28.42 kg/m   Physical Exam Vitals and nursing note reviewed.  Constitutional:      General: She is not in acute distress.    Appearance: She is well-developed. She is obese. She is not ill-appearing, toxic-appearing or diaphoretic.  HENT:     Head: Normocephalic and atraumatic.     Nose: Nose normal.     Mouth/Throat:     Mouth: Mucous membranes are moist.  Eyes:     Pupils: Pupils are equal, round, and reactive to light.  Cardiovascular:     Rate and Rhythm: Normal rate.     Pulses: Normal pulses.     Heart sounds: Normal heart sounds.  Pulmonary:      Effort: Pulmonary effort is normal. No respiratory distress.     Breath sounds: Normal breath sounds.  Abdominal:     General: Bowel sounds are normal. There is no distension.     Palpations: There is no mass.     Tenderness: There is no abdominal tenderness. There is no right CVA tenderness, left CVA tenderness, guarding or rebound.     Hernia: A hernia is present.     Comments: Diastasis recti vs ventral wall hernia. Abd soft, non tender without rebound or guarding. No large midline pulsatile mas   Musculoskeletal:        General: No swelling, tenderness, deformity or signs of injury. Normal range of motion.     Cervical back: Normal range of motion.     Left lower leg: No edema.     Comments: Moves all 4 extremities  without difficulty. Lifts both arms above head. Able to ask and extend at bilateral lower extremities without difficulty.  Compartments soft.  Denna HaggardHomans' sign negative.  No midline tenderness palpation without rebound or guarding  Skin:    General: Skin is warm and dry.     Capillary Refill: Capillary refill takes less than 2 seconds.     Comments: No edema, erythema or warmth.  No fluctuance or induration. No rashes or lesions  Neurological:     Comments: Cranial nerves II to grossly intact Slow to answer questions however patient alert to name, place and time. Intact sensation.     ED Results / Procedures / Treatments   Labs (all labs ordered are listed, but only abnormal results are displayed) Labs Reviewed  CBC WITH DIFFERENTIAL/PLATELET - Abnormal; Notable for the following components:      Result Value   Neutro Abs 8.1 (*)    All other components within normal limits  COMPREHENSIVE METABOLIC PANEL - Abnormal; Notable for the following components:   Sodium 134 (*)    Potassium 5.4 (*)    CO2 14 (*)    Glucose, Bld 108 (*)    BUN 88 (*)    Creatinine, Ser 6.71 (*)    GFR calc non Af Amer 6 (*)    GFR calc Af Amer 7 (*)    Anion gap 16 (*)    All other  components within normal limits  URINALYSIS, ROUTINE W REFLEX MICROSCOPIC - Abnormal; Notable for the following components:   APPearance HAZY (*)    Protein, ur 30 (*)    Bacteria, UA FEW (*)    All other components within normal limits  LACTIC ACID, PLASMA - Abnormal; Notable for the following components:   Lactic Acid, Venous 2.9 (*)    All other components within normal limits  LACTIC ACID, PLASMA - Abnormal; Notable for the following components:   Lactic Acid, Venous 3.7 (*)    All other components within normal limits  PROTIME-INR - Abnormal; Notable for the following components:   Prothrombin Time 18.5 (*)    INR 1.6 (*)    All other components within normal limits  CBG MONITORING, ED - Abnormal; Notable for the following components:   Glucose-Capillary 128 (*)    All other components within normal limits  CBG MONITORING, ED - Abnormal; Notable for the following components:   Glucose-Capillary 49 (*)    All other components within normal limits  I-STAT VENOUS BLOOD GAS, ED - Abnormal; Notable for the following components:   pH, Ven 7.124 (*)    pCO2, Ven 43.5 (*)    pO2, Ven 146.0 (*)    Bicarbonate 14.3 (*)    TCO2 16 (*)    Acid-base deficit 15.0 (*)    Potassium 5.5 (*)    All other components within normal limits  CBG MONITORING, ED - Abnormal; Notable for the following components:   Glucose-Capillary 125 (*)    All other components within normal limits  SARS CORONAVIRUS 2 BY RT PCR (HOSPITAL ORDER, PERFORMED IN [facility])  URINE CULTURE  CULTURE, BLOOD (ROUTINE X 2)  CULTURE, BLOOD (ROUTINE X 2)  ACETAMINOPHEN LEVEL  SALICYLATE LEVEL  ETHANOL  LACTIC ACID, PLASMA  LACTIC ACID, PLASMA  CBG MONITORING, ED    EKG EKG Interpretation  Date/Time:  Thursday March 14 2020 17:53:20 EDT Ventricular Rate:  65 PR Interval:  192 QRS Duration: 102 QT Interval:  464 QTC Calculation: 482 R Axis:   -  120 Text Interpretation: AV dual-paced rhythm  Biventricular pacemaker detected Abnormal ECG No significant change since last tracing Confirmed by Richardean Canal [phone]) on 03/14/2020 7:44:56 PM   Radiology DG Chest 2 View  Result Date: 03/14/2020 CLINICAL DATA:  Cough and chills. EXAM: CHEST - 2 VIEW COMPARISON:  February 03, 2020 FINDINGS: A multi lead AICD is in place. Mild, stable increased bronchovascular lung markings are seen without evidence of acute infiltrate, pleural effusion or pneumothorax. The cardiac silhouette is moderately enlarged and unchanged in size. The visualized skeletal structures are unremarkable. IMPRESSION: Stable cardiomegaly without evidence of acute or active cardiopulmonary disease. Electronically Signed   By: Aram Candela M.D.   On: 03/14/2020 18:41   CT Head Wo Contrast  Result Date: 03/14/2020 CLINICAL DATA:  Altered level of consciousness, delirium EXAM: CT HEAD WITHOUT CONTRAST TECHNIQUE: Contiguous axial images were obtained from the base of the skull through the vertex without intravenous contrast. COMPARISON:  02/03/2020 FINDINGS: Brain: Chronic hypodensities are seen within the bilateral basal ganglia, bilateral thalami, and periventricular white matter consistent with chronic small vessel ischemic changes. Focal area of encephalomalacia are right posterior cerebellar hemisphere also likely consistent with chronic ischemic change. No evidence of acute infarct or hemorrhage. The lateral ventricles and midline structures are stable. No acute extra-axial fluid collections. No mass effect. Vascular: No hyperdense vessel or unexpected calcification. Skull: Normal. Negative for fracture or focal lesion. Sinuses/Orbits: No acute finding. Other: None. IMPRESSION: 1. Stable chronic ischemic changes as above. No acute intracranial process. Electronically Signed   By: Sharlet Salina M.D.   On: 03/14/2020 21:05   CT Renal Stone Study  Result Date: 03/14/2020 CLINICAL DATA:  New onset back pain, concern for obstructive  uropathy EXAM: CT ABDOMEN AND PELVIS WITHOUT CONTRAST TECHNIQUE: Multidetector CT imaging of the abdomen and pelvis was performed following the standard protocol without IV contrast. COMPARISON:  Chest radiograph 03/14/2020 FINDINGS: Lower chest: Lung bases are clear. Cardiomegaly with pacer leads in the coronary sinus, right atrium and right ventricular apex. Trace pericardial fluid is likely within physiologic normal. Lower thoracic aortic atherosclerosis. Hepatobiliary: No focal liver abnormality is visible on this unenhanced CT. Smooth liver surface contour. Normal liver attenuation. Patient is post cholecystectomy. Slight prominence of the biliary tree likely related to reservoir effect. No calcified intraductal gallstones. Pancreas: Partial fatty replacement of the pancreas. No pancreatic ductal dilatation or surrounding inflammatory changes. Spleen: Normal in size. No concerning splenic lesions. Adrenals/Urinary Tract: Normal adrenal glands. Kidneys normally located. Asymmetric left upper and interpolar scarring. No visible suspicious renal lesions. No perinephric stranding or inflammatory changes are seen. No obstructive urolithiasis or hydronephrosis is seen. Urinary bladder is decompressed by inflated Foley catheter. Mild wall thickening is nonspecific given underdistention. Few foci of gas within the bladder lumen likely related to instrumentation. Stomach/Bowel: Distal esophagus, stomach and duodenal sweep are unremarkable. No small bowel wall thickening or dilatation. No evidence of obstruction. Appendix is not visualized. No focal inflammation the vicinity of the cecum to suggest an occult appendicitis. No colonic dilatation or wall thickening. Vascular/Lymphatic: Atherosclerotic calcifications within the abdominal aorta and branch vessels. No aneurysm or ectasia. No enlarged abdominopelvic lymph nodes. None a career up by high Reproductive: Patient appears to be post hysterectomy with quiescent  retained ovarian tissues. No concerning adnexal lesions. Other: No abdominopelvic free fluid or free gas. No bowel containing hernias. Ventral diastasis recti. Musculoskeletal: Mild degenerative changes in the spine, hips and pelvis. No acute osseous abnormality or suspicious osseous lesion. IMPRESSION:  1. No obstructive urolithiasis or hydronephrosis. 2. Asymmetric left upper and interpolar scarring. 3. Bladder is decompressed by inflated Foley catheter. Mild wall thickening is nonspecific given underdistention. Consider correlation with urinalysis to exclude cystitis. 4. Cardiomegaly. 5. Aortic Atherosclerosis (ICD10-I70.0). Electronically Signed   By: Kreg Shropshire M.D.   On: 03/14/2020 21:11    Procedures .Critical Care Performed by: Linwood Dibbles, PA-C Authorized by: Linwood Dibbles, PA-C   Critical care provider statement:    Critical care time (minutes):  75   Critical care was necessary to treat or prevent imminent or life-threatening deterioration of the following conditions:  Renal failure and dehydration   Critical care was time spent personally by me on the following activities:  Discussions with consultants, evaluation of patient's response to treatment, examination of patient, ordering and performing treatments and interventions, ordering and review of laboratory studies, ordering and review of radiographic studies, pulse oximetry, re-evaluation of patient's condition, obtaining history from patient or surrogate and review of old charts   (including critical care time)  Medications Ordered in ED Medications  dextrose 10 % infusion ( Intravenous New Bag/Given 03/14/20 2018)  sodium chloride 0.9 % bolus 1,000 mL (has no administration in time range)  dextrose 50 % solution 50 mL (50 mLs Intravenous Given 03/14/20 1929)  sodium chloride 0.9 % bolus 1,000 mL (0 mLs Intravenous Stopped 03/14/20 2212)  sodium chloride 0.9 % bolus 1,000 mL (1,000 mLs Intravenous New Bag/Given 03/14/20  2202)  sodium bicarbonate injection 50 mEq (50 mEq Intravenous Given 03/14/20 2202)   ED Course  I have reviewed the triage vital signs and the nursing notes.  Pertinent labs & imaging results that were available during my care of the patient were reviewed by me and considered in my medical decision making (see chart for details).  64 year old female presents for evaluation of altered mental status.  Seen by EMS, CBG noted to be 38.  25 g D10 given.  Blood sugar responded well to 136 however patient was still slow to respond and subsequently sent to the emergency department.  Patient's only complaint is diffuse bilateral flank pain.  Patient intermittently altered and confused.  She moves bilateral upper and lower extremities slowly however no neglect.  She has no facial droop.  She does have an enlarged right lower lip however patient states is at baseline.  I am able to visualize her posterior oropharynx.  She has positive bilateral CVA tap.   CBG on arrival 128, repeat 49, given 1 amp D50, given ARF will be started on IVF with dextrose  Labs and imaging personally reviewed and interpreted: CBC without leukocytosis Metabolic panel hyperkalemia 5.4, hyperglycemia to 108, BUN 88, creatinine 6.71 patient denies any melena or bright blood per rectum.  EKG without STEMI, paced rhythm, no peak T waves to indicate hyperkalemia DG chest without infiltrates, pulmonary edema, pneumothorax CT head without acute changes CT renal without acute renal stone she does have some bladder wall thickening correlate with UA.  Bladder decompressed with Foley catheter  2030: Updated patient's daughter Essie Christine.  Discussed inpatient admission given acute worsening renal failure as well as persistent hypoglycemia.  Her imaging is pending. Foley cath placed 2/2 renal failure.  2115: Patient hemodynamically stable. Repeat CBG at 131 however not crossing over into Epic. Will continue with IVF with dextrose. Question  patients lactic acidosis due to her possible having seizure? Give history of this. No evidence of infectious process on exam. Low suspicion for sepsis at this  time. Also question acidosis from her new onset renal failure, possible dehydration.  CONSULT with Dr. Leta Speller with Nephrology who recommends bicarb, fluids. They will see her in the morning. Recommends keeping n.p.o. after midnight as she may possibly need dialysis tomorrow.   Will consult with hospitalist for admission. Patient currently protecting airway, hemodynamically stable.  CONSULT with FM teaching, Dr. Pecola Leisure who expresses concern for patient's lactic acid. They are requesting ICU consult for possible admission to critical care. They also request additional IVF in addition to the 2 L she has already been given and Tylenol, ASA level given acidosis.  CONSULT with Dr. Arsenio Loader with Critical care. States he has look at patients labs in Epic states critical care will consult however requests family practice to admit.  CONSULT with Dr. Pecola Leisure with FM, they will assess patient for admission.  0015: CONSULT with Dr. Selena Batten with FM teaching states she has evaluated patient along with Dr. Pecola Leisure. She does not feel comfortable admitting patient as she feels patient will continue to decompensate until patient is emergently dialyzed. She will discuss with Dr. Dellie Catholic with PCCM whom I spoke with previous for CC admission.  Patient will be admitted for acute renal failure as well as persistent hypoglycemia and further work up of acidosis  The patient appears reasonably stabilized for admission considering the current resources, flow, and capabilities available in the ED at this time, and I doubt any other [facility] requiring further screening and/or treatment in the ED prior to admission.  Patient seen eval by attending, Dr. Silverio Lay who agrees with treatment, plan and disposition.  Lanae Crumbly, PA-C aware that patient will be seen by PCCM and FM for  admission    MDM Rules/Calculators/A&P                          Louvenia Golomb was evaluated in Emergency Department on 03/14/2020 for the symptoms described in the history of present illness. She was evaluated in the context of the global COVID-19 pandemic, which necessitated consideration that the patient might be at risk for infection with the SARS-CoV-2 virus that causes COVID-19. Institutional protocols and algorithms that pertain to the evaluation of patients at risk for COVID-19 are in a state of rapid change based on information released by regulatory bodies including the CDC and federal and state organizations. These policies and algorithms were followed during the patient's care in the ED. Final Clinical Impression(s) / ED Diagnoses Final diagnoses:  Acute renal failure, unspecified acute renal failure type (HCC)  Hypoglycemia  Hyperkalemia  Elevated lactic acid level    Rx / DC Orders ED Discharge Orders    None       Royal Beirne A, PA-C 03/14/20 2353    Vivianna Piccini A, PA-C 03/15/20 1107    Nivia Gervase A, PA-C 03/15/20 1107    Charlynne Pander, MD 03/20/20 1530

## 2020-03-14 NOTE — ED Notes (Signed)
Pt back from CT

## 2020-03-14 NOTE — ED Notes (Signed)
Date and time results received: 03/14/20 1945 (use smartphrase ".now" to insert current time)  Test: Lactic Critical Value: 2.9  Name of Provider Notified: MD Silverio Lay  Orders Received? Or Actions Taken?:

## 2020-03-14 NOTE — ED Notes (Addendum)
Date and time results received: 03/14/20 2145 (use smartphrase ".now" to insert current time)  Test: istat, pH Critical Value: 7.124  Name of Provider Notified: PA Charyl Bigger, MD yao  Orders Received? Or Actions Taken?: Awaiting orders

## 2020-03-14 NOTE — ED Notes (Signed)
Date and time results received: 03/14/20 1924 (use smartphrase ".now" to insert current time)  Test: CBG Critical Value: 60  Name of Provider Notified: PA Henderly  Orders Received? Or Actions Taken?: D50

## 2020-03-14 NOTE — ED Notes (Signed)
Patient transported to CT 

## 2020-03-15 ENCOUNTER — Inpatient Hospital Stay (HOSPITAL_COMMUNITY): Payer: Medicare Other

## 2020-03-15 DIAGNOSIS — E162 Hypoglycemia, unspecified: Secondary | ICD-10-CM | POA: Insufficient documentation

## 2020-03-15 DIAGNOSIS — N182 Chronic kidney disease, stage 2 (mild): Secondary | ICD-10-CM | POA: Diagnosis not present

## 2020-03-15 DIAGNOSIS — N189 Chronic kidney disease, unspecified: Secondary | ICD-10-CM | POA: Diagnosis present

## 2020-03-15 DIAGNOSIS — N170 Acute kidney failure with tubular necrosis: Secondary | ICD-10-CM

## 2020-03-15 DIAGNOSIS — I700 Atherosclerosis of aorta: Secondary | ICD-10-CM | POA: Diagnosis present

## 2020-03-15 DIAGNOSIS — Z79890 Hormone replacement therapy (postmenopausal): Secondary | ICD-10-CM | POA: Diagnosis not present

## 2020-03-15 DIAGNOSIS — Z7189 Other specified counseling: Secondary | ICD-10-CM | POA: Insufficient documentation

## 2020-03-15 DIAGNOSIS — G92 Toxic encephalopathy: Secondary | ICD-10-CM | POA: Diagnosis present

## 2020-03-15 DIAGNOSIS — I361 Nonrheumatic tricuspid (valve) insufficiency: Secondary | ICD-10-CM | POA: Diagnosis not present

## 2020-03-15 DIAGNOSIS — Z794 Long term (current) use of insulin: Secondary | ICD-10-CM | POA: Diagnosis not present

## 2020-03-15 DIAGNOSIS — Z515 Encounter for palliative care: Secondary | ICD-10-CM | POA: Insufficient documentation

## 2020-03-15 DIAGNOSIS — M6208 Separation of muscle (nontraumatic), other site: Secondary | ICD-10-CM | POA: Diagnosis present

## 2020-03-15 DIAGNOSIS — Z79899 Other long term (current) drug therapy: Secondary | ICD-10-CM | POA: Diagnosis not present

## 2020-03-15 DIAGNOSIS — I482 Chronic atrial fibrillation, unspecified: Secondary | ICD-10-CM | POA: Diagnosis not present

## 2020-03-15 DIAGNOSIS — I5042 Chronic combined systolic (congestive) and diastolic (congestive) heart failure: Secondary | ICD-10-CM | POA: Diagnosis present

## 2020-03-15 DIAGNOSIS — I480 Paroxysmal atrial fibrillation: Secondary | ICD-10-CM | POA: Diagnosis present

## 2020-03-15 DIAGNOSIS — E875 Hyperkalemia: Secondary | ICD-10-CM | POA: Insufficient documentation

## 2020-03-15 DIAGNOSIS — Z7901 Long term (current) use of anticoagulants: Secondary | ICD-10-CM | POA: Diagnosis not present

## 2020-03-15 DIAGNOSIS — M545 Low back pain: Secondary | ICD-10-CM | POA: Diagnosis present

## 2020-03-15 DIAGNOSIS — N179 Acute kidney failure, unspecified: Secondary | ICD-10-CM | POA: Diagnosis present

## 2020-03-15 DIAGNOSIS — E872 Acidosis: Secondary | ICD-10-CM | POA: Diagnosis present

## 2020-03-15 DIAGNOSIS — Z8673 Personal history of transient ischemic attack (TIA), and cerebral infarction without residual deficits: Secondary | ICD-10-CM | POA: Diagnosis not present

## 2020-03-15 DIAGNOSIS — E039 Hypothyroidism, unspecified: Secondary | ICD-10-CM | POA: Diagnosis present

## 2020-03-15 DIAGNOSIS — G40909 Epilepsy, unspecified, not intractable, without status epilepticus: Secondary | ICD-10-CM | POA: Diagnosis present

## 2020-03-15 DIAGNOSIS — R531 Weakness: Secondary | ICD-10-CM | POA: Diagnosis not present

## 2020-03-15 DIAGNOSIS — R7989 Other specified abnormal findings of blood chemistry: Secondary | ICD-10-CM | POA: Insufficient documentation

## 2020-03-15 DIAGNOSIS — E1122 Type 2 diabetes mellitus with diabetic chronic kidney disease: Secondary | ICD-10-CM | POA: Diagnosis present

## 2020-03-15 DIAGNOSIS — Z66 Do not resuscitate: Secondary | ICD-10-CM | POA: Diagnosis present

## 2020-03-15 DIAGNOSIS — I130 Hypertensive heart and chronic kidney disease with heart failure and stage 1 through stage 4 chronic kidney disease, or unspecified chronic kidney disease: Secondary | ICD-10-CM | POA: Diagnosis present

## 2020-03-15 DIAGNOSIS — Z87891 Personal history of nicotine dependence: Secondary | ICD-10-CM | POA: Diagnosis not present

## 2020-03-15 DIAGNOSIS — Z789 Other specified health status: Secondary | ICD-10-CM | POA: Diagnosis not present

## 2020-03-15 DIAGNOSIS — N19 Unspecified kidney failure: Secondary | ICD-10-CM | POA: Diagnosis present

## 2020-03-15 DIAGNOSIS — Z20822 Contact with and (suspected) exposure to covid-19: Secondary | ICD-10-CM | POA: Diagnosis present

## 2020-03-15 DIAGNOSIS — E11649 Type 2 diabetes mellitus with hypoglycemia without coma: Secondary | ICD-10-CM | POA: Diagnosis present

## 2020-03-15 DIAGNOSIS — E2740 Unspecified adrenocortical insufficiency: Secondary | ICD-10-CM | POA: Diagnosis present

## 2020-03-15 LAB — CBC
HCT: 37.4 % (ref 36.0–46.0)
Hemoglobin: 11.6 g/dL — ABNORMAL LOW (ref 12.0–15.0)
MCH: 28.6 pg (ref 26.0–34.0)
MCHC: 31 g/dL (ref 30.0–36.0)
MCV: 92.1 fL (ref 80.0–100.0)
Platelets: 257 10*3/uL (ref 150–400)
RBC: 4.06 MIL/uL (ref 3.87–5.11)
RDW: 12.8 % (ref 11.5–15.5)
WBC: 10.1 10*3/uL (ref 4.0–10.5)
nRBC: 0 % (ref 0.0–0.2)

## 2020-03-15 LAB — ETHANOL: Alcohol, Ethyl (B): <10 mg/dL (ref <10)

## 2020-03-15 LAB — BASIC METABOLIC PANEL
Anion gap: 13 (ref 5–15)
BUN: 73 mg/dL — ABNORMAL HIGH (ref 8–23)
CO2: 14 mmol/L — ABNORMAL LOW (ref 22–32)
Calcium: 8.3 mg/dL — ABNORMAL LOW (ref 8.9–10.3)
Chloride: 105 mmol/L (ref 98–111)
Creatinine, Ser: 4.9 mg/dL — ABNORMAL HIGH (ref 0.44–1.00)
GFR calc Af Amer: 10 mL/min — ABNORMAL LOW (ref >60)
GFR calc non Af Amer: 9 mL/min — ABNORMAL LOW (ref >60)
Glucose, Bld: 218 mg/dL — ABNORMAL HIGH (ref 70–99)
Potassium: 4.2 mmol/L (ref 3.5–5.1)
Sodium: 132 mmol/L — ABNORMAL LOW (ref 135–145)

## 2020-03-15 LAB — ECHOCARDIOGRAM COMPLETE
Area-P 1/2: 2.48 cm2
Calc EF: 61.2 %
Height: 60 in
S' Lateral: 4 cm
Single Plane A2C EF: 65.7 %
Single Plane A4C EF: 50.6 %
Weight: 2328.06 oz

## 2020-03-15 LAB — URINALYSIS, ROUTINE W REFLEX MICROSCOPIC
Bilirubin Urine: NEGATIVE
Glucose, UA: NEGATIVE mg/dL
Ketones, ur: NEGATIVE mg/dL
Nitrite: NEGATIVE
Protein, ur: NEGATIVE mg/dL
Specific Gravity, Urine: 1.008 (ref 1.005–1.030)
pH: 5 (ref 5.0–8.0)

## 2020-03-15 LAB — CBG MONITORING, ED
Glucose-Capillary: 194 mg/dL — ABNORMAL HIGH (ref 70–99)
Glucose-Capillary: 218 mg/dL — ABNORMAL HIGH (ref 70–99)
Glucose-Capillary: 253 mg/dL — ABNORMAL HIGH (ref 70–99)
Glucose-Capillary: 271 mg/dL — ABNORMAL HIGH (ref 70–99)
Glucose-Capillary: 305 mg/dL — ABNORMAL HIGH (ref 70–99)
Glucose-Capillary: 260 mg/dL — ABNORMAL HIGH (ref 70–99)
Glucose-Capillary: 238 mg/dL — ABNORMAL HIGH (ref 70–99)
Glucose-Capillary: 131 mg/dL — ABNORMAL HIGH (ref 70–99)
Glucose-Capillary: 97 mg/dL (ref 70–99)
Glucose-Capillary: 144 mg/dL — ABNORMAL HIGH (ref 70–99)

## 2020-03-15 LAB — MAGNESIUM: Magnesium: 1.3 mg/dL — ABNORMAL LOW (ref 1.7–2.4)

## 2020-03-15 LAB — HEPARIN LEVEL (UNFRACTIONATED): Heparin Unfractionated: 0.54 IU/mL (ref 0.30–0.70)

## 2020-03-15 LAB — LACTIC ACID, PLASMA: Lactic Acid, Venous: 2.7 mmol/L (ref 0.5–1.9)

## 2020-03-15 LAB — ACETAMINOPHEN LEVEL: Acetaminophen (Tylenol), Serum: <10 ug/mL — ABNORMAL LOW (ref 10–30)

## 2020-03-15 LAB — CORTISOL: Cortisol, Plasma: 86.7 ug/dL

## 2020-03-15 LAB — PHOSPHORUS: Phosphorus: 4.6 mg/dL (ref 2.5–4.6)

## 2020-03-15 LAB — SODIUM, URINE, RANDOM: Sodium, Ur: 83 mmol/L

## 2020-03-15 LAB — SALICYLATE LEVEL: Salicylate Lvl: <7 mg/dL — ABNORMAL LOW (ref 7.0–30.0)

## 2020-03-15 MED ORDER — ATORVASTATIN CALCIUM 10 MG PO TABS
20.0000 mg | ORAL_TABLET | Freq: Every day | ORAL | Status: DC
  Administered 2020-03-15 – 2020-03-18 (×4): 20 mg via ORAL
  Filled 2020-03-15 (×4): qty 2

## 2020-03-15 MED ORDER — LEVETIRACETAM 750 MG PO TABS
750.0000 mg | ORAL_TABLET | Freq: Two times a day (BID) | ORAL | Status: DC
  Administered 2020-03-15 – 2020-03-19 (×10): 750 mg via ORAL
  Filled 2020-03-15 (×14): qty 1

## 2020-03-15 MED ORDER — LEVETIRACETAM ER 750 MG PO TB24: 750.0000 mg | ORAL_TABLET | Freq: Two times a day (BID) | ORAL | Status: DC

## 2020-03-15 MED ORDER — HEPARIN (PORCINE) 25000 UT/250ML-% IV SOLN
850.0000 [IU]/h | INTRAVENOUS | Status: DC
  Administered 2020-03-15 – 2020-03-16 (×2): 850 [IU]/h via INTRAVENOUS
  Filled 2020-03-15 (×2): qty 250

## 2020-03-15 MED ORDER — PERFLUTREN LIPID MICROSPHERE
1.0000 mL | INTRAVENOUS | Status: AC | PRN
  Administered 2020-03-15: 2 mL via INTRAVENOUS
  Filled 2020-03-15: qty 10

## 2020-03-15 MED ORDER — HYDROCORTISONE NA SUCCINATE PF 100 MG IJ SOLR
50.0000 mg | Freq: Two times a day (BID) | INTRAMUSCULAR | Status: AC
  Administered 2020-03-16 – 2020-03-17 (×4): 50 mg via INTRAVENOUS
  Filled 2020-03-15 (×4): qty 2

## 2020-03-15 MED ORDER — SODIUM CHLORIDE 0.9 % IV SOLN
2.0000 g | Freq: Every day | INTRAVENOUS | Status: DC
  Administered 2020-03-15: 2 g via INTRAVENOUS
  Filled 2020-03-15 (×2): qty 20

## 2020-03-15 MED ORDER — POLYETHYLENE GLYCOL 3350 17 G PO PACK: 17.0000 g | PACK | Freq: Every day | ORAL | Status: DC | PRN

## 2020-03-15 MED ORDER — SODIUM CHLORIDE 0.9 % IV SOLN
500.0000 mg | Freq: Every day | INTRAVENOUS | Status: DC
  Administered 2020-03-15: 500 mg via INTRAVENOUS
  Filled 2020-03-15: qty 500

## 2020-03-15 MED ORDER — HYDROCORTISONE NA SUCCINATE PF 100 MG IJ SOLR
50.0000 mg | Freq: Four times a day (QID) | INTRAMUSCULAR | Status: AC
  Administered 2020-03-15 (×4): 50 mg via INTRAVENOUS
  Filled 2020-03-15 (×4): qty 2

## 2020-03-15 MED ORDER — PANTOPRAZOLE SODIUM 40 MG IV SOLR
40.0000 mg | Freq: Every day | INTRAVENOUS | Status: DC
  Administered 2020-03-15 – 2020-03-16 (×2): 40 mg via INTRAVENOUS
  Filled 2020-03-15 (×2): qty 40

## 2020-03-15 MED ORDER — INSULIN ASPART 100 UNIT/ML ~~LOC~~ SOLN
0.0000 [IU] | SUBCUTANEOUS | Status: DC
  Administered 2020-03-15: 8 [IU] via SUBCUTANEOUS
  Administered 2020-03-15 (×2): 5 [IU] via SUBCUTANEOUS
  Administered 2020-03-15 – 2020-03-16 (×2): 2 [IU] via SUBCUTANEOUS
  Administered 2020-03-16: 5 [IU] via SUBCUTANEOUS

## 2020-03-15 MED ORDER — DOCUSATE SODIUM 100 MG PO CAPS: 100.0000 mg | ORAL_CAPSULE | Freq: Two times a day (BID) | ORAL | Status: DC | PRN

## 2020-03-15 MED ORDER — STERILE WATER FOR INJECTION IV SOLN
INTRAVENOUS | Status: DC
  Filled 2020-03-15 (×4): qty 850

## 2020-03-15 MED ORDER — HYDROCORTISONE NA SUCCINATE PF 100 MG IJ SOLR
50.0000 mg | Freq: Every day | INTRAMUSCULAR | Status: AC
  Administered 2020-03-18 – 2020-03-19 (×2): 50 mg via INTRAVENOUS
  Filled 2020-03-15 (×3): qty 2

## 2020-03-15 MED ORDER — MAGNESIUM SULFATE 2 GM/50ML IV SOLN
2.0000 g | Freq: Once | INTRAVENOUS | Status: AC
  Administered 2020-03-15: 2 g via INTRAVENOUS
  Filled 2020-03-15: qty 50

## 2020-03-15 MED ORDER — LEVOTHYROXINE SODIUM 25 MCG PO TABS
125.0000 ug | ORAL_TABLET | Freq: Every day | ORAL | Status: DC
  Administered 2020-03-15 – 2020-03-19 (×5): 125 ug via ORAL
  Filled 2020-03-15 (×5): qty 1

## 2020-03-15 NOTE — Progress Notes (Signed)
PCCM interval progress note:  Pt rounded on overnight, is awake and stable and RN has no concerns at this time.  Will contact PCCM overnight for any issues.  Darcella Gasman Chanay Nugent, PA-C

## 2020-03-15 NOTE — Consult Note (Signed)
[facility] Renal Consultation Note  Requesting MD:  Indication for Consultation: Acute kidney injury, maintenance euvolemia, assessment treatment of acid-base disorders, assessment treatment of electrolyte disorders.  HPI:  Nichole [name] is a 64 y.o. female.  History of congestive heart failure systolic/diastolic dysfunction on 2D echo July 2021 with an ejection fraction of 35 to 45%., diabetes hypertension atrial fibrillation on Coumadin and seizure disorders brought in with altered mental status.  She has a baseline serum creatinine about 1.3 to 1.6 mg/dL.  And in the emergency room was found to have a creatinine of 6.7 mg/dL.  She underwent CT scan evaluation of her abdomen that did not reveal any evidence of hydronephrosis.  She underwent Foley catheter placement.  Home medications include carvedilol 12.5 mg twice daily Lasix 40 mg daily glimepiride 4 mg daily hydralazine 100 mg 3 times daily Keppra 750 mg twice daily levothyroxine 125 mg daily Metformin 1 g twice daily ramipril 5 mg daily warfarin 6 mg nightly with Lipitor 20 mg daily and Farxiga 10 mg daily  Blood pressure 123/79 pulse 79 temperature 97.7 O2 sats 96% no recorded urine output but volume resuscitated 2.3 L  Most recent labs sodium 132 potassium 4.2 chloride 105 CO2 14 BUN 73 creatinine 4.9 glucose 218 calcium 8.3 hemoglobin 11.6   Current medications insulin sliding scale Keppra 750 mg twice daily, levothyroxine 125 mg daily Protonix 40 mg daily,    azithromycin 500 mg daily,  Rocephin 2 g daily, IV heparin,  sodium bicarbonate 100 cc an hour.   Creatinine, Ser  Date/Time Value Ref Range Status  03/15/2020 03:22 AM 4.90 (H) 0.44 - 1.00 mg/dL Final  16/04/9603 54:09 PM 6.71 (H) 0.44 - 1.00 mg/dL Final  81/19/1478 29:56 PM 1.65 (H) 0.44 - 1.00 mg/dL Final  21/30/8657 84:69 PM 1.38 (H) 0.44 - 1.00 mg/dL Final  62/95/2841 32:44 AM 0.82 0.44 - 1.00 mg/dL Final  07/15/7251 66:44 AM 0.88 0.44 - 1.00 mg/dL  Final  03/47/4259 56:38 AM 0.86 0.44 - 1.00 mg/dL Final  75/64/3329 51:88 AM 0.96 0.44 - 1.00 mg/dL Final  41/66/0630 16:01 PM 0.99 0.44 - 1.00 mg/dL Final     PMHx:   Past Medical History:  Diagnosis Date  . CHF (congestive heart failure) (HCC)   . Diabetes mellitus without complication (HCC)   . Hypertension   . Hypothyroidism   . Paroxysmal atrial fibrillation (HCC)   . Seizures (HCC)     Past Surgical History:  Procedure Laterality Date  . PACEMAKER IMPLANT      Family Hx: No family history on file.  Social History:  reports that she quit smoking about 21 years ago. She smoked 0.25 packs per day. She has never used smokeless tobacco. She reports that she does not drink alcohol and does not use drugs.  Allergies: No Known Allergies  Medications: Prior to Admission medications   Medication Sig Start Date End Date Taking? Authorizing Provider  carvedilol (COREG) 12.5 MG tablet Take 12.5 mg by mouth in the morning and at bedtime. 07/15/18 03/14/20 Yes [provider]  Cholecalciferol (VITAMIN D3) 1.25 MG (50000 UT) CAPS Take 50,000 Units by mouth every Friday. 01/10/20  Yes [provider]  furosemide (LASIX) 40 MG tablet Take 40 mg by mouth daily. 01/10/20  Yes [provider]  glimepiride (AMARYL) 4 MG tablet Take 4 mg by mouth daily with breakfast.  01/10/20  Yes [provider]  hydrALAZINE (APRESOLINE) 100 MG tablet Take 100 mg by mouth 3 (three) times  daily. 01/12/20  Yes [provider]  insulin glargine (LANTUS SOLOSTAR) 100 UNIT/ML Solostar Pen Inject 30 Units into the skin 2 (two) times daily.   Yes [provider]  levETIRAcetam 750 MG TB24 Take 1 tablet (750 mg total) by mouth 2 (two) times daily. 02/14/20 03/15/20 Yes Joy, Shawn C, PA-C  levothyroxine (SYNTHROID) 125 MCG tablet Take 125 mcg by mouth every morning. 09/06/19  Yes [provider]  metFORMIN (GLUCOPHAGE) 1000 MG tablet Take 1,000 mg by mouth 2 (two)  times daily. 01/10/20  Yes [provider]  mirabegron ER (MYRBETRIQ) 25 MG TB24 tablet Take 25 mg by mouth daily.   Yes [provider]  ramipril (ALTACE) 5 MG capsule Take 5 mg by mouth daily.  01/10/20  Yes [provider]  warfarin (COUMADIN) 6 MG tablet Take 6 mg by mouth every evening. 01/10/20  Yes [provider]  atorvastatin (LIPITOR) 20 MG tablet Take 1 tablet (20 mg total) by mouth daily. 02/07/20 03/08/20  Arrien, York Ram, MD  dapagliflozin propanediol (FARXIGA) 5 MG TABS tablet Take 2 tablets (10 mg total) by mouth daily. Patient not taking: Reported on 03/14/2020 03/11/20   Parke Poisson, MD     Labs:  Results for orders placed or performed during the hospital encounter of 03/14/20 (from the past 48 hour(s))  CBG monitoring, ED     Status: Abnormal   Collection Time: 03/14/20  5:39 PM  Result Value Ref Range   Glucose-Capillary 128 (H) 70 - 99 mg/dL    Comment: Glucose reference range applies only to samples taken after fasting for at least 8 hours.  CBC with Differential     Status: Abnormal   Collection Time: 03/14/20  5:45 PM  Result Value Ref Range   WBC 10.3 4.0 - 10.5 K/uL   RBC 4.34 3.87 - 5.11 MIL/uL   Hemoglobin 12.3 12.0 - 15.0 g/dL   HCT 83.1 36 - 46 %   MCV 92.2 80.0 - 100.0 fL   MCH 28.3 26.0 - 34.0 pg   MCHC 30.8 30.0 - 36.0 g/dL   RDW 51.7 61.6 - 07.3 %   Platelets 270 150 - 400 K/uL   Neutrophils Relative % 78 %   Neutro Abs 8.1 (H) 1.7 - 7.7 K/uL   Lymphocytes Relative 16 %   Lymphs Abs 1.7 0.7 - 4.0 K/uL   Monocytes Relative 5 %   Monocytes Absolute 0.5 0 - 1 K/uL   Eosinophils Relative 1 %   Eosinophils Absolute 0.1 0 - 0 K/uL   Basophils Relative 0 %   Basophils Absolute 0.0 0 - 0 K/uL    Comment: Performed at [facility], [address] [place], [place] [address]  Comprehensive metabolic panel     Status: Abnormal   Collection Time: 03/14/20  5:45 PM  Result Value Ref Range   Sodium 134 (L)  135 - 145 mmol/L   Potassium 5.4 (H) 3.5 - 5.1 mmol/L   Chloride 104 98 - 111 mmol/L   CO2 14 (L) 22 - 32 mmol/L   Glucose, Bld 108 (H) 70 - 99 mg/dL    Comment: Glucose reference range applies only to samples taken after fasting for at least 8 hours.   BUN 88 (H) 8 - 23 mg/dL   Creatinine, Ser 6.94 (H) 0.44 - 1.00 mg/dL   Calcium 9.7 8.9 - 85.4 mg/dL   Total Protein 7.8 6.5 - 8.1 g/dL   Albumin 4.1 3.5 -  5.0 g/dL   AST 18 15 - 41 U/L   ALT 21 0 - 44 U/L   Alkaline Phosphatase 41 38 - 126 U/L   Total Bilirubin 0.5 0.3 - 1.2 mg/dL   GFR calc non Af Amer 6 (L) >60 mL/min   GFR calc Af Amer 7 (L) >60 mL/min   Anion gap 16 (H) 5 - 15    Comment: Performed at [facility], [address] [place], [place] [address]  Protime-INR     Status: Abnormal   Collection Time: 03/14/20  5:45 PM  Result Value Ref Range   Prothrombin Time 18.5 (H) 11.4 - 15.2 seconds   INR 1.6 (H) 0.8 - 1.2    Comment: (NOTE) INR goal varies based on device and disease states. Performed at [facility], [address] [place], [place] [address]   Lactic acid, plasma     Status: Abnormal   Collection Time: 03/14/20  5:46 PM  Result Value Ref Range   Lactic Acid, Venous 2.9 (HH) 0.5 - 1.9 mmol/L    Comment: CRITICAL RESULT CALLED TO, READ BACK BY AND VERIFIED WITH: Vanetta ShawlA BIENIEK RN [phone] [id number] K FORSYTH Performed at [facility], [address] [place], [place] [address]   Blood culture (routine x 2)     Status: None (Preliminary result)   Collection Time: 03/14/20  6:25 PM   Specimen: BLOOD  Result Value Ref Range   Specimen Description BLOOD LEFT ANTECUBITAL    Special Requests      BOTTLES DRAWN AEROBIC AND ANAEROBIC Blood Culture adequate volume   Culture      NO GROWTH < 12 HOURS Performed at [facility], [address] [place], [place] [address]    Report Status PENDING   POC CBG, ED     Status: Abnormal   Collection Time: 03/14/20  7:24 PM  Result Value Ref Range    Glucose-Capillary 49 (L) 70 - 99 mg/dL    Comment: Glucose reference range applies only to samples taken after fasting for at least 8 hours.  SARS Coronavirus 2 by RT PCR (hospital order, performed in [facility] hospital lab) Nasopharyngeal Nasopharyngeal Swab     Status: None   Collection Time: 03/14/20  7:37 PM   Specimen: Nasopharyngeal Swab  Result Value Ref Range   SARS Coronavirus 2 NEGATIVE NEGATIVE    Comment: (NOTE) SARS-CoV-2 target nucleic acids are NOT DETECTED.  The SARS-CoV-2 RNA is generally detectable in upper and lower respiratory specimens during the acute phase of infection. The lowest concentration of SARS-CoV-2 viral copies this assay can detect is 250 copies / mL. A negative result does not preclude SARS-CoV-2 infection and should not be used as the sole basis for treatment or other patient management decisions.  A negative result may occur with improper specimen collection / handling, submission of specimen other than nasopharyngeal swab, presence of viral mutation(s) within the areas targeted by this assay, and inadequate number of viral copies (<250 copies / mL). A negative result must be combined with clinical observations, patient history, and epidemiological information.  Fact Sheet for Patients:   BoilerBrush.com.cyhttps://www.fda.gov/media/136312/download  Fact Sheet for Healthcare Providers: https://pope.com/https://www.fda.gov/media/136313/download  This test is not yet approved or  cleared by the [place] FDA and has been authorized for detection and/or diagnosis of SARS-CoV-2 by FDA under an Emergency Use Authorization (EUA).  This EUA will remain in effect (meaning this test can be used) for the duration of  the COVID-19 declaration under Section 564(b)(1) of the Act, 21 U.S.C. section 360bbb-3(b)(1), unless the authorization is terminated or revoked sooner.  Performed at [facility], [address], [place], [place] [address]   Blood culture (routine x 2)      Status: None (Preliminary result)   Collection Time: 03/14/20  8:42 PM   Specimen: BLOOD  Result Value Ref Range   Specimen Description BLOOD SITE NOT SPECIFIED    Special Requests      BOTTLES DRAWN AEROBIC AND ANAEROBIC Blood Culture adequate volume   Culture      NO GROWTH < 12 HOURS Performed at [facility], [address] [place], [place] [address]    Report Status PENDING   Lactic acid, plasma     Status: Abnormal   Collection Time: 03/14/20  8:59 PM  Result Value Ref Range   Lactic Acid, Venous 3.7 (HH) 0.5 - 1.9 mmol/L    Comment: CRITICAL VALUE NOTED.  VALUE IS CONSISTENT WITH PREVIOUSLY REPORTED AND CALLED VALUE. Performed at [facility], [address], [place], [place] [address]   POC CBG, ED     Status: Abnormal   Collection Time: 03/14/20  9:22 PM  Result Value Ref Range   Glucose-Capillary 125 (H) 70 - 99 mg/dL    Comment: Glucose reference range applies only to samples taken after fasting for at least 8 hours.  Urinalysis, Routine w reflex microscopic Urine, Catheterized     Status: Abnormal   Collection Time: 03/14/20  9:24 PM  Result Value Ref Range   Color, Urine YELLOW YELLOW   APPearance HAZY (A) CLEAR   Specific Gravity, Urine 1.016 1.005 - 1.030   pH 5.0 5.0 - 8.0   Glucose, UA NEGATIVE NEGATIVE mg/dL   Hgb urine dipstick NEGATIVE NEGATIVE   Bilirubin Urine NEGATIVE NEGATIVE   Ketones, ur NEGATIVE NEGATIVE mg/dL   Protein, ur 30 (A) NEGATIVE mg/dL   Nitrite NEGATIVE NEGATIVE   Leukocytes,Ua NEGATIVE NEGATIVE   RBC / HPF 0-5 0 - 5 RBC/hpf   WBC, UA 0-5 0 - 5 WBC/hpf   Bacteria, UA FEW (A) NONE SEEN   Squamous Epithelial / LPF 21-50 0 - 5   Mucus PRESENT    Hyaline Casts, UA PRESENT     Comment: Performed at [facility], [address] [place], [place] [address]  I-Stat venous blood gas, [facility] ED)     Status: Abnormal   Collection Time: 03/14/20  9:34 PM  Result Value Ref Range   pH, Ven 7.124 (LL) 7.25 - 7.43   pCO2, Ven 43.5  (L) 44 - 60 mmHg   pO2, Ven 146.0 (H) 32 - 45 mmHg   Bicarbonate 14.3 (L) 20.0 - 28.0 mmol/L   TCO2 16 (L) 22 - 32 mmol/L   O2 Saturation 98.0 %   Acid-base deficit 15.0 (H) 0.0 - 2.0 mmol/L   Sodium 136 135 - 145 mmol/L   Potassium 5.5 (H) 3.5 - 5.1 mmol/L   Calcium, Ion 1.21 1.15 - 1.40 mmol/L   HCT 40.0 36 - 46 %   Hemoglobin 13.6 12.0 - 15.0 g/dL   Sample type VENOUS   Acetaminophen level     Status: Abnormal   Collection Time: 03/15/20  3:21 AM  Result Value Ref Range   Acetaminophen (Tylenol), Serum <10 (L) 10 - 30 ug/mL    Comment: (NOTE) Therapeutic concentrations vary significantly. A range of 10-30 ug/mL  may be an effective  concentration for many patients. However, some  are best treated at concentrations outside of this range. Acetaminophen concentrations >150 ug/mL at 4 hours after ingestion  and >50 ug/mL at 12 hours after ingestion are often associated with  toxic reactions.  Performed at [facility], [address], [place], [place] [address]   Salicylate level     Status: Abnormal   Collection Time: 03/15/20  3:21 AM  Result Value Ref Range   Salicylate Lvl <7.0 (L) 7.0 - 30.0 mg/dL    Comment: Performed at [facility], [address], [place], [place] [address]  Ethanol     Status: None   Collection Time: 03/15/20  3:21 AM  Result Value Ref Range   Alcohol, Ethyl (B) <10 <10 mg/dL    Comment: (NOTE) Lowest detectable limit for serum alcohol is 10 mg/dL.  For medical purposes only. Performed at [facility], [address], [place], [place] [address]   Cortisol     Status: None   Collection Time: 03/15/20  3:21 AM  Result Value Ref Range   Cortisol, Plasma 86.7 ug/dL    Comment: RESULTS CONFIRMED BY MANUAL DILUTION (NOTE) AM    6.7 - 22.6 ug/dL PM   <00.7       ug/dL Performed at [facility], [address] [place], [place] [address]   Lactic acid, plasma     Status: Abnormal   Collection Time: 03/15/20  3:22 AM   Result Value Ref Range   Lactic Acid, Venous 2.7 (HH) 0.5 - 1.9 mmol/L    Comment: CRITICAL VALUE NOTED.  VALUE IS CONSISTENT WITH PREVIOUSLY REPORTED AND CALLED VALUE. Performed at [facility], [address] [place], [place] [address]   CBC     Status: Abnormal   Collection Time: 03/15/20  3:22 AM  Result Value Ref Range   WBC 10.1 4.0 - 10.5 K/uL   RBC 4.06 3.87 - 5.11 MIL/uL   Hemoglobin 11.6 (L) 12.0 - 15.0 g/dL   HCT 62.5 36 - 46 %   MCV 92.1 80.0 - 100.0 fL   MCH 28.6 26.0 - 34.0 pg   MCHC 31.0 30.0 - 36.0 g/dL   RDW 63.8 93.7 - 34.2 %   Platelets 257 150 - 400 K/uL   nRBC 0.0 0.0 - 0.2 %    Comment: Performed at [facility], [address], [place], [place] [address]  Basic metabolic panel     Status: Abnormal   Collection Time: 03/15/20  3:22 AM  Result Value Ref Range   Sodium 132 (L) 135 - 145 mmol/L   Potassium 4.2 3.5 - 5.1 mmol/L   Chloride 105 98 - 111 mmol/L   CO2 14 (L) 22 - 32 mmol/L   Glucose, Bld 218 (H) 70 - 99 mg/dL    Comment: Glucose reference range applies only to samples taken after fasting for at least 8 hours.   BUN 73 (H) 8 - 23 mg/dL   Creatinine, Ser 1.57 (H) 0.44 - 1.00 mg/dL   Calcium 8.3 (L) 8.9 - 10.3 mg/dL   GFR calc non Af Amer 9 (L) >60 mL/min   GFR calc Af Amer 10 (L) >60 mL/min   Anion gap 13 5 - 15    Comment: Performed at [facility], [address], [place], [place] [address]  Magnesium     Status: Abnormal   Collection Time: 03/15/20  3:22 AM  Result  Value Ref Range   Magnesium 1.3 (L) 1.7 - 2.4 mg/dL    Comment: Performed at [facility], [address], [place], [place] [address]  Phosphorus     Status: None   Collection Time: 03/15/20  3:22 AM  Result Value Ref Range   Phosphorus 4.6 2.5 - 4.6 mg/dL    Comment: Performed at [facility], [address], [place], [place] [address]  POC CBG, ED     Status: Abnormal   Collection Time: 03/15/20  3:41 AM  Result Value Ref Range    Glucose-Capillary 194 (H) 70 - 99 mg/dL    Comment: Glucose reference range applies only to samples taken after fasting for at least 8 hours.  Sodium, urine, random     Status: None   Collection Time: 03/15/20  4:15 AM  Result Value Ref Range   Sodium, Ur 83 mmol/L    Comment: Performed at [facility], [address], [place], [place] [address]  CBG monitoring, ED     Status: Abnormal   Collection Time: 03/15/20  6:25 AM  Result Value Ref Range   Glucose-Capillary 218 (H) 70 - 99 mg/dL    Comment: Glucose reference range applies only to samples taken after fasting for at least 8 hours.  CBG monitoring, ED     Status: Abnormal   Collection Time: 03/15/20  7:38 AM  Result Value Ref Range   Glucose-Capillary 253 (H) 70 - 99 mg/dL    Comment: Glucose reference range applies only to samples taken after fasting for at least 8 hours.  CBG monitoring, ED     Status: Abnormal   Collection Time: 03/15/20  8:46 AM  Result Value Ref Range   Glucose-Capillary 305 (H) 70 - 99 mg/dL    Comment: Glucose reference range applies only to samples taken after fasting for at least 8 hours.  CBG monitoring, ED     Status: Abnormal   Collection Time: 03/15/20  8:49 AM  Result Value Ref Range   Glucose-Capillary 271 (H) 70 - 99 mg/dL    Comment: Glucose reference range applies only to samples taken after fasting for at least 8 hours.     ROS:    Physical Exam: Vitals:   03/15/20 0730 03/15/20 0800  BP: 108/69 100/61  Pulse: 82 60  Resp: 20 14  Temp:    SpO2: 97% 96%     General: Chronically ill-appearing no obvious distress HEENT: Normocephalic atraumatic mucous membranes moist Eyes: Pupils round equal reactive no icterus or pallor Neck: Supple no thyromegaly no adenopathy Heart: Regular rate and rhythm no murmurs rubs or gallops Lungs: Tachypneic no wheezes or rales Abdomen: Soft nontender no organosplenomegaly Extremities: Trace edema no cyanosis clubbing Skin: No skin rashes or  lesions Neuro: No focal deficits  Assessment/Plan: 1.Acute kidney injury.  Patient with altered mental status volume resuscitated.  Discontinued ACE inhibitor no use nonsteroidal anti-inflammatory drugs as far as we can tell.  No evidence of hydronephrosis.  We will continue to follow renal panel.  There does seem to be some improvement in renal function.  Continue to avoid nephrotoxins.  Daily renal panel.  Close monitoring of I's and O's.  Foley catheter placement.  Appropriate dosing of medications including antibiotics and avoid the use of morphine products.  Will check urinalysis.  Baseline serum creatinine appears to be about 1.3 to 1.5 mg/dL 2. Hypertension/volume  -stable at this time.  Volume resuscitation. 3.  Metabolic acidosis agree  with IV sodium bicarbonate.  We will continue at 100 cc an hour 4.  Congestive heart failure with diastolic and systolic dysfunction we will need to closely follow especially in the setting of volume resuscitation 5.  History of seizure disorders continues on Keppra 6.  Urinary tract infection CT evidence of cystitis antibiotics per primary service   Garnetta Buddy 03/15/2020, 9:21 AM

## 2020-03-15 NOTE — Progress Notes (Signed)
  Echocardiogram 2D Echocardiogram with definity has been performed.  Leta Jungling M 03/15/2020, 1:35 PM

## 2020-03-15 NOTE — Evaluation (Signed)
Clinical/Bedside Swallow Evaluation Patient Details  Name: Nichole [name] MRN: [id number] Date of Birth: [birth year]  Today's Date: 03/15/2020 Time: SLP Start Time (ACUTE ONLY): 1658 SLP Stop Time (ACUTE ONLY): 1730 SLP Time Calculation (min) (ACUTE ONLY): 32 min  Past Medical History:  Past Medical History:  Diagnosis Date  . CHF (congestive heart failure) (HCC)   . Diabetes mellitus without complication (HCC)   . Hypertension   . Hypothyroidism   . Paroxysmal atrial fibrillation (HCC)   . Seizures (HCC)    Past Surgical History:  Past Surgical History:  Procedure Laterality Date  . PACEMAKER IMPLANT     HPI:  64 year old female with past medical history significant for CHF, diabetes, hypertension, chronic heart failure A. fib on warfarin, seizures, CVA who presented with altered mental status. Dx acute renal failure, metabolic acidosis, FTT.  Has had recurrent ED visits; her husband passed away in 2023-02-02. Palliative care consulted and GOC meeting pending.    Assessment / Plan / Recommendation Clinical Impression  Pt presents with dysphagia with notable reduced oral manipulation, residue in anterior sulcus after tspns of applesauce, a delay in initiation of the swallow for as long as 60 seconds after bolus presentation.  Thin liquids, purees were consumed very slowly but without overt concerns for aspiration.  A graham cracker was manually removed from pt's mouth after attempting to chew/swallow led to explosive coughing.  Recommend initiating a full liquid diet; pt will need assistance to eat/drink.  Elevate HOB and allow ample time.  SLP will follow for GOC after tomorrow's meeting with Palliative Care.  SLP Visit Diagnosis: Dysphagia, oral phase (R13.11)    Aspiration Risk       Diet Recommendation   full liquids  Medication Administration: Crushed with puree    Other  Recommendations Oral Care Recommendations: Oral care BID   Follow up Recommendations Other (comment) (tba)       Frequency and Duration min 2x/week  1 week       Prognosis        Swallow Study   General HPI: 64 year old female with past medical history significant for CHF, diabetes, hypertension, chronic heart failure A. fib on warfarin, seizures, CVA who presented with altered mental status. Dx acute renal failure, metabolic acidosis, FTT.  Has had recurrent ED visits; her husband passed away in Feb 02, 2023. Palliative care consulted and GOC meeting pending.  Type of Study: Bedside Swallow Evaluation Previous Swallow Assessment: no Diet Prior to this Study: NPO Temperature Spikes Noted: No Respiratory Status: Room air History of Recent Intubation: No Behavior/Cognition: Alert Oral Cavity Assessment: Edema (lower lip appeared to be edematous) Oral Care Completed by SLP: Recent completion by staff Oral Cavity - Dentition: Adequate natural dentition Vision: Functional for self-feeding Self-Feeding Abilities: Needs assist Patient Positioning: Partially reclined Baseline Vocal Quality: Normal;Low vocal intensity Volitional Cough: Strong Volitional Swallow: Able to elicit    Oral/Motor/Sensory Function Overall Oral Motor/Sensory Function: Within functional limits   Ice Chips Ice chips: Within functional limits   Thin Liquid Thin Liquid: Within functional limits    Nectar Thick Nectar Thick Liquid: Not tested   Honey Thick Honey Thick Liquid: Not tested   Puree Puree: Impaired Presentation: Spoon Oral Phase Impairments: Reduced labial seal Oral Phase Functional Implications: Oral residue;Prolonged oral transit   Solid     Solid: Impaired Presentation: Self Fed Oral Phase Impairments: Impaired mastication Pharyngeal Phase Impairments: Cough - Immediate      Blenda Mounts Laurice 03/15/2020,5:45 PM  Evie Croston L. Briena Swingler, MA CCC/SLP  [place] Office number [phone] Pager [phone]

## 2020-03-15 NOTE — Progress Notes (Signed)
ANTICOAGULATION CONSULT NOTE - Initial Consult  Pharmacy Consult for heparin Indication: atrial fibrillation  No Known Allergies  Patient Measurements: Height: 5' (152.4 cm) Weight: 66 kg (145 lb 8.1 oz) IBW/kg (Calculated) : 45.5 Heparin Dosing Weight: ~ 64 kg  Vital Signs: Temp: 97.7 F (36.5 C) (09/02 1726) Temp Source: Oral (09/02 1726) BP: 93/56 (09/03 0045) Pulse Rate: 82 (09/03 0045)  Labs: Recent Labs    03/14/20 1745 03/14/20 2134  HGB 12.3 13.6  HCT 40.0 40.0  PLT 270  --   LABPROT 18.5*  --   INR 1.6*  --   CREATININE 6.71*  --     Estimated Creatinine Clearance: 7.2 mL/min (A) (by C-G formula based on SCr of 6.71 mg/dL (H)).   Medical History: Past Medical History:  Diagnosis Date  . CHF (congestive heart failure) (HCC)   . Diabetes mellitus without complication (HCC)   . Hypertension   . Hypothyroidism   . Paroxysmal atrial fibrillation (HCC)   . Seizures (HCC)     Medications:  Infusions:  . azithromycin    . cefTRIAXone (ROCEPHIN)  IV    . dextrose 125 mL/hr at 03/14/20 2018    Assessment: 64 yo female with afib, on chronic Coumadin.  Admitted with AMS and elevated Scr.  INR 1.6 on admission, last warfarin dose 9/1.  Pharmacy asked to begin IV heparin.  Goal of Therapy:  Heparin level 0.3-0.7 units/ml Monitor platelets by anticoagulation protocol: Yes   Plan:  1. Start heparin gtt at 850 units/hr without bolus. 2. Check heparin level in 8 hrs. 3. Daily heparin level and CBC.  Reece Leader, Colon Flattery, BCCP Clinical Pharmacist  03/15/2020 1:48 AM   [facility] pharmacy phone numbers are listed on [email or web address]

## 2020-03-15 NOTE — Progress Notes (Signed)
NAME:  Nichole [name], MRN:  [id number], DOB:  Jul 17, 1955, LOS: 0 ADMISSION DATE:  03/14/2020, CONSULTATION DATE:  03/15/20 REFERRING MD:  EDP, CHIEF COMPLAINT:  AMS   Brief History   64 y.o. F with past medical history of HFrEF, diabetes, hypertension, atrial fibrillation on Coumadin and seizure disorder who was brought in for altered mental status.  Family noticed increased lethargy with possible absence seizure's.  Work-up significant for worsening renal function, hypoglycemia and lactic acidosis with imaging studies pending.  PCCM consulted for admission  History of present illness   Nichole [name] is a 64 year old female with PMH of HFrEF, diabetes, hypertension, atrial fibrillation on Coumadin and seizure disorder on Keppra.  She was noted to be more lethargic and altered by family, EMS was called and found patient's glucose to be 38 with possible absence seizure.  Mental status improved with D50.  In the ED, patient's creatinine was 6.71 above recent baseline of 1.3-1.6 with potassium of 5.4 and anion gap metabolic acidosis and lactic acid of 2.9.  No obvious sources of infection, CT head and abdomen/pelvis pending.  She was given IV fluids and D10 and PCCM consulted for admission  Past Medical History   has a past medical history of CHF (congestive heart failure) (HCC), Diabetes mellitus without complication (HCC), Hypertension, Hypothyroidism, Paroxysmal atrial fibrillation (HCC), and Seizures (HCC).  Significant Hospital Events   9/2 Admit to PCCM  Consults:    Procedures:    Significant Diagnostic Tests:  Head CT neg CT renal stone: neg  CXR: no infiltrate, The patient's images have been independently reviewed by me.    July 2021: ECHO IMPRESSIONS  1. Left ventricular ejection fraction, by estimation, is 35 to 40%. The  left ventricle has moderately decreased function. The left ventricle  demonstrates global hypokinesis. Left ventricular diastolic parameters are  consistent  with Grade I diastolic  dysfunction (impaired relaxation).  2. Right ventricular systolic function is mildly reduced. The right  ventricular size is mildly enlarged. There is normal pulmonary artery  systolic pressure. The estimated right ventricular systolic pressure is  25.1 mmHg.  3. The mitral valve is grossly normal. Mild mitral valve regurgitation.  No evidence of mitral stenosis.  4. The aortic valve is tricuspid. Aortic valve regurgitation is not  visualized. No aortic stenosis is present.  5. The inferior vena cava is normal in size with greater than 50%  respiratory variability, suggesting right atrial pressure of 3 mmHg.   Micro Data:  COVID neg BCx: pending  UCx: pending   Antimicrobials:  Ceftriaxone  Azithromycin   Interim history/subjective:  No events. Remains confused. Labs a little better.  Objective   Blood pressure 100/61, pulse 60, temperature 97.7 F (36.5 C), temperature source Oral, resp. rate 14, height 5' (1.524 m), weight 66 kg, SpO2 96 %.        Intake/Output Summary (Last 24 hours) at 03/15/2020 0814 Last data filed at 03/15/2020 0451 Gross per 24 hour  Intake 2350 ml  Output --  Net 2350 ml   Filed Weights   03/14/20 2213  Weight: 66 kg    Examination: GEN: chronically ill woman in NAD HEENT: MMM, trachea midline CV: RRR, ext warm PULM: Shallow inspiratory effort, nonlabored, lungs clear GI: Soft, diffusely TTP EXT: trace edmea NEURO: moves all 4 ext intermittently to command PSYCH: RASS 0 SKIN: No rashes  CT renal possible cystitis  Resolved Hospital Problem list     Assessment & Plan:   Acute renal failure, uremia  Metabolic Acidosis  NAGMA Cause uncertain at present, question poor PO - Start bicarb gtt - Await renal input  Chronic systolic heart failure  - prior EF 30-40%  - holding nephrotoxic meds, holding ace(-)   Hypotension  Hypoglycemia  Possible sepsis  Associated with hypothermia, renal failure,  hypoglycemia  Cultures pending  Ct with mild bladder thickening  - Ceftriaxone and azithro  - taper d10 - f/u cortisol level - f/u culture data  PAF  On coumadin  May need dialysis access Hold pta coumadin  -Start heparin ggt   Seizure disorder  - home keppra   FTT, recurrent admission, GoC Spoke with daughter on phone at length.  Patient's husband passed in July and she moved in with her daughter.  Since, has had anorexia, FTT, recurrent ER visits.  Daughter sounds overwhelmed. We discussed that in situations like this where a longterm partner passes, the will to live of the remaining partner is frequently irreversibly gone.  Dialysis is not a good option here and I think daughter agrees. She think a palliative care discussion is a good option, I will ask the service to call/meet with patient's son and daughter to see if hospice may be a good option.  Best practice:  Diet: SLP eval Pain/Anxiety/Delirium protocol (if indicated): NA  VAP protocol (if indicated): NA DVT prophylaxis: heparin  GI prophylaxis: ppi  Glucose control: CBG + d5 Mobility: BR  Code Status: FULL Family Communication:  Disposition: ICU   The patient is critically ill with multiple organ systems failure and requires high complexity decision making for assessment and support, frequent evaluation and titration of therapies, application of advanced monitoring technologies and extensive interpretation of multiple databases. Critical Care Time devoted to patient care services described in this note independent of APP/resident time (if applicable)  is 45 minutes.   Myrla Halsted MD [facility] 03/15/2020 8:26 AM Personal pager: [phone] If unanswered, please page CCM On-call: #[phone]

## 2020-03-15 NOTE — Progress Notes (Addendum)
Called by ED for admission.  This is a 64 year old female with past medical history significant for CHF, diabetes, hypertension, A. fib on warfarin, seizures, CVA who presented with altered mental status and changes in responsiveness.  On arrival to the ED, patient's labs were notable for sodium 134, potassium 5.4, creatinine 6.71, anion gap 16.  4 days ago, her creatinine was 1.65.  Patient's lactic acid was 2.9 then 3.7.  Her venous blood gas with pH of 7.124, PCO2 43.5, bicarb 14.3.  ED ordered ethanol, salicylate, acetaminophen level to rule out other causes of elevated AGMA. Nephrology was also consulted in the emergency department.  They will come and see patient in the morning and anticipate possible dialysis.  Family medicine was called for admission for this patient.   Given her abrupt increase in creatinine, patient's acidosis is most likely due to her acute renal failure.  Foley placed in the emergency department with little UOP and oliguric.  On physical exam, patient is alert and oriented x3.  She appears tired but does not seem confused.  She does speak slowly and has increased latency when answering questions.  While in the room for physical exam, patient's maps in the low 60s with slow decline from 70s around an hour prior.  Given patient's worsening maps and oliguria, I am concerned that she will soon require pressors for hypotension.  Additionally, patient with HFrEF and ejection fraction of 35 to 40%.  She is status post 3 L and repeat lactic acid labs have been ordered to see if there has been any improvement with resusciation.  Patient denies any shortness of breath at this time and is satting well on room air, but given her oliguria and fluid resuscitation in the setting of her AKI and acidosis, raises concern for potential pulmonary edema.    Called Dr. Cherly Beach for CCM consults given patient's worsening maps in setting of lactic acidosis.  Patient will be seen by CCM ground team for  potential admission. Will await their evaluation.   Melene Plan, M.D.  1:38 AM 03/15/2020

## 2020-03-15 NOTE — Progress Notes (Signed)
ANTICOAGULATION CONSULT NOTE  Pharmacy Consult for heparin Indication: atrial fibrillation  No Known Allergies  Patient Measurements: Height: 5' (152.4 cm) Weight: 66 kg (145 lb 8.1 oz) IBW/kg (Calculated) : 45.5 Heparin Dosing Weight: ~ 64 kg  Vital Signs: BP: 110/69 (09/03 1130) Pulse Rate: 63 (09/03 1215)  Labs: Recent Labs    03/14/20 1745 03/14/20 1745 03/14/20 2134 03/15/20 0322 03/15/20 1117  HGB 12.3   < > 13.6 11.6*  --   HCT 40.0  --  40.0 37.4  --   PLT 270  --   --  257  --   LABPROT 18.5*  --   --   --   --   INR 1.6*  --   --   --   --   HEPARINUNFRC  --   --   --   --  0.54  CREATININE 6.71*  --   --  4.90*  --    < > = values in this interval not displayed.    Estimated Creatinine Clearance: 9.8 mL/min (A) (by C-G formula based on SCr of 4.9 mg/dL (H)).   Medical History: Past Medical History:  Diagnosis Date  . CHF (congestive heart failure) (HCC)   . Diabetes mellitus without complication (HCC)   . Hypertension   . Hypothyroidism   . Paroxysmal atrial fibrillation (HCC)   . Seizures (HCC)     Medications:  Infusions:  . azithromycin Stopped (03/15/20 0451)  . cefTRIAXone (ROCEPHIN)  IV Stopped (03/15/20 0441)  . dextrose 125 mL/hr at 03/15/20 0452  . heparin 850 Units/hr (03/15/20 0327)  .  sodium bicarbonate (isotonic) infusion in sterile water 100 mL/hr at 03/15/20 4496    Assessment: 64 yo female with afib, on chronic Coumadin.  Admitted with AMS and elevated Scr.  INR 1.6 on admission, last warfarin dose 9/1.  Initial heparin level therapeutic, no infusion issues  Goal of Therapy:  Heparin level 0.3-0.7 units/ml Monitor platelets by anticoagulation protocol: Yes   Plan:  Continue heparin gtt at 850 units/hr Daily heparin level, CBC, s/s/ bleeding  Daylene Posey, PharmD Clinical Pharmacist ED Pharmacist Phone # [phone] 03/15/2020 1:07 PM

## 2020-03-15 NOTE — Progress Notes (Addendum)
Patient stabilized. Ongoing discussions with palliative care. Appreciate nephrology input as well. Okay to admit to progressive bed, appreciate Family Medicine service taking over starting 03/16/20.  Discussed with their resident on call.  Myrla Halsted MD PCCM

## 2020-03-15 NOTE — Consult Note (Signed)
Consultation Note Date: 03/15/2020   Patient Name: Nichole [name]  DOB: 10-May-1956  MRN: [id number]  Age / Sex: 64 y.o., female  PCP: Patient, No Pcp Per Referring Physician: Candee Furbish, MD  Reason for Consultation: Establishing goals of care  HPI/Patient Profile: 64 y.o. female  with past medical history of seizures, atrial fibrillation, hypertension, diabetes mellitus, CHF (EF 30-40%), CVA on warfarin admitted on 03/14/2020 with acute renal failure, metabolic acidosis, acute metabolic encephalopathy, hypoglycemia.  Patient and family face treatment option decisions, advanced directive decisions, and anticipatory care needs.   Clinical Assessment and Goals of Care: I have reviewed medical records including EPIC notes, labs, and imaging. Received report from primary RN - no acute concerns.   Went to visit patient at bedside - no family present. Patient was lying in bed awake, alert, oriented, and able to participate in conversation. No signs or non-verbal gestures of pain or discomfort noted. No respiratory distress, increased work of breathing, or secretions noted.   Met with patient to discuss diagnosis, prognosis, GOC, EOL wishes, disposition, and options.  I introduced Palliative Medicine as specialized medical care for people living with serious illness. It focuses on providing relief from the symptoms and stress of a serious illness. The goal is to improve quality of life for both the patient and the family.  We discussed a brief life review of the patient. Nichole [name]' husband of 30 years passed away in Jan 23, 2023 and she expresses missing him very much. She has two children prior to her marriage - one daughter and one son. For three years prior to 01/23/2023, she lived with her son in [place]. As of 01/23/2023 after her husband passed, she has lived with her daughter in [place]. Nichole [name] states that she plans to go back and  live with her son in November. Nichole [name] is a spiritual person and is of [place].  As far as functional and nutritional status, prior to hospitalization, the patient lived at home with her daughter. The patient was able to perform all ADLs and ambulate independently around her house. However, she is not able to drive or run errands, her daughter grocery shops and takes the patient to all her appointments as needed. Nichole [name] states that she has been eating and drinking well at home without issue.   We discussed patient's current illness and what it means in the larger context of patient's on-going co-morbidities. The patient remembered she was brought to the hospital for low blood sugar, but was not fully in understanding of her worsening renal function. Education and discussion was had around her current medical situation, including her renal failure and the need for dialysis. Dialysis procedure and procedure for HD cath placement was reviewed.  Natural disease trajectory and expectations at EOL were discussed. I attempted to elicit values and goals of care important to the patient. The difference between aggressive medical intervention and comfort care was considered in light of the patient's goals of care. The patient was not ready to make a  decision today on if she wanted to proceed with dialysis or not. Explained to the patient she would need to make a decision soon. The patient requested that she talk with her son and daughter first. Offered to set up a meeting with her, PMT, son, and daughter, so [name] conversation could continue - patient was agreeable.   Advance directives, concepts specific to code status, artificial feeding and hydration, and rehospitalization were considered and discussed. The patient stated she does not have a living will or HCPOA. She stated she wanted her son/Nichole [name] to be her healthcare agent. Offered chaplain services to help her complete HCPOA document, prayer, and  emotional support - she was agreeable and appreciative. Encouraged patient to consider DNR/DNI status - understanding evidenced based poor outcomes in similar hospitalized patient, as the cause of arrest is likely associated with advanced chronic illness rather than an easily reversible acute cardio-pulmonary event. Patient was agreeable to DNR/DNI with understanding that she would not receive CPR, defibrillation, ACLS medications, or intubation.  Discussed with patient the importance of continued conversation with family and the medical providers regarding overall plan of care and treatment options, ensuring decisions are within the context of the patient's values and GOCs.    Questions and concerns were addressed. The patient was encouraged to call with questions or concerns. PMT card was provided.   1:45 PM Called patient's daughter/Nichole [name] - set up family meeting with patient, daughter/Nichole [name], and son/Nichole [name] at 11:00am 03/16/20.  Primary Decision Maker: PATIENT  SUMMARY OF RECOMMENDATIONS   -Continue full scope medical treatment -DNR/DNI initiated -Before making final decisions regarding her plan of care, patient wanted to have [place] meeting with her daughter and son present -[place] meeting with family and patient set up for tomorrow 9/4 at 11:00am via phone  -Chaplain consult placed for emotional support, prayer, and to make her son/Nichole [name] her HCPOA -PMT will continue to follow holistically  Code Status/Advance Care Planning:  DNR  Palliative Prophylaxis:   Aspiration, Bowel Regimen, Delirium Protocol, Frequent Pain Assessment and Turn Reposition  Additional Recommendations (Limitations, Scope, Preferences):  Full Scope Treatment  Psycho-social/Spiritual:   Desire for further Chaplaincy support:yes  Created space and opportunity for patient and family to express thoughts and feelings regarding patient's current medical situation.   Emotional support provided.  Prognosis:    Unable to determine  Discharge Planning: To Be Determined      Primary Diagnoses: Present on Admission: . Renal failure   I have reviewed the medical record, interviewed the patient and family, and examined the patient. The following aspects are pertinent.  Past Medical History:  Diagnosis Date  . CHF (congestive heart failure) ([place])   . Diabetes mellitus without complication ([place])   . Hypertension   . Hypothyroidism   . Paroxysmal atrial fibrillation (HCC)   . Seizures ([place])    Social History   Socioeconomic History  . Marital status: Widowed    Spouse name: Not on file  . Number of children: Not on file  . Years of education: Not on file  . Highest education level: Not on file  Occupational History  . Not on file  Tobacco Use  . Smoking status: Former Smoker    Packs/day: 0.25    Quit date: 02/06/1999    Years since quitting: 21.1  . Smokeless tobacco: Never Used  Substance and Sexual Activity  . Alcohol use: Never  . Drug use: Never  . Sexual activity: Not on file  Other Topics Concern  . Not on file  Social History Narrative  . Not on file   Social Determinants of Health   Financial Resource Strain:   . Difficulty of Paying Living Expenses: Not on file  Food Insecurity:   . Worried About Charity fundraiser in the Last Year: Not on file  . Ran Out of Food in the Last Year: Not on file  Transportation Needs:   . Lack of Transportation (Medical): Not on file  . Lack of Transportation (Non-Medical): Not on file  Physical Activity:   . Days of Exercise per Week: Not on file  . Minutes of Exercise per Session: Not on file  Stress:   . Feeling of Stress : Not on file  Social Connections:   . Frequency of Communication with Friends and Family: Not on file  . Frequency of Social Gatherings with Friends and Family: Not on file  . Attends Religious Services: Not on file  . Active Member of Clubs or Organizations: Not on file  . Attends Theatre manager Meetings: Not on file  . Marital Status: Not on file   No family history on file. Scheduled Meds: . atorvastatin  20 mg Oral q1800  . hydrocortisone sod succinate (SOLU-CORTEF) inj  50 mg Intravenous Q6H   Followed by  . [START ON 03/16/2020] hydrocortisone sod succinate (SOLU-CORTEF) inj  50 mg Intravenous Q12H   Followed by  . [START ON 03/18/2020] hydrocortisone sod succinate (SOLU-CORTEF) inj  50 mg Intravenous Daily  . insulin aspart  0-15 Units Subcutaneous Q4H  . levETIRAcetam  750 mg Oral BID  . levothyroxine  125 mcg Oral QAC breakfast  . pantoprazole (PROTONIX) IV  40 mg Intravenous Q0600   Continuous Infusions: . azithromycin Stopped (03/15/20 0451)  . cefTRIAXone (ROCEPHIN)  IV Stopped (03/15/20 0441)  . heparin 850 Units/hr (03/15/20 0327)  .  sodium bicarbonate (isotonic) infusion in sterile water 100 mL/hr at 03/15/20 0902   PRN Meds:.docusate sodium, perflutren lipid microspheres (DEFINITY) IV suspension, polyethylene glycol Medications Prior to Admission:  Prior to Admission medications   Medication Sig Start Date End Date Taking? Authorizing Provider  carvedilol (COREG) 12.5 MG tablet Take 12.5 mg by mouth in the morning and at bedtime. 07/15/18 03/14/20 Yes [provider]  Cholecalciferol (VITAMIN D3) 1.25 MG (50000 UT) CAPS Take 50,000 Units by mouth every Friday. 01/10/20  Yes [provider]  furosemide (LASIX) 40 MG tablet Take 40 mg by mouth daily. 01/10/20  Yes [provider]  glimepiride (AMARYL) 4 MG tablet Take 4 mg by mouth daily with breakfast.  01/10/20  Yes [provider]  hydrALAZINE (APRESOLINE) 100 MG tablet Take 100 mg by mouth 3 (three) times daily. 01/12/20  Yes [provider]  insulin glargine (LANTUS SOLOSTAR) 100 UNIT/ML Solostar Pen Inject 30 Units into the skin 2 (two) times daily.   Yes [provider]  levETIRAcetam 750 MG TB24 Take 1 tablet (750 mg total) by mouth 2 (two) times  daily. 02/14/20 03/15/20 Yes Joy, Shawn C, PA-C  levothyroxine (SYNTHROID) 125 MCG tablet Take 125 mcg by mouth every morning. 09/06/19  Yes [provider]  metFORMIN (GLUCOPHAGE) 1000 MG tablet Take 1,000 mg by mouth 2 (two) times daily. 01/10/20  Yes [provider]  mirabegron ER (MYRBETRIQ) 25 MG TB24 tablet Take 25 mg by mouth daily.   Yes [provider]  ramipril (ALTACE) 5 MG capsule Take 5 mg by mouth daily.  01/10/20  Yes [provider]  warfarin (COUMADIN) 6 MG  tablet Take 6 mg by mouth every evening. 01/10/20  Yes [provider]  atorvastatin (LIPITOR) 20 MG tablet Take 1 tablet (20 mg total) by mouth daily. 02/07/20 03/08/20  Arrien, Jimmy Picket, MD  dapagliflozin propanediol (FARXIGA) 5 MG TABS tablet Take 2 tablets (10 mg total) by mouth daily. Patient not taking: Reported on 03/14/2020 03/11/20   Elouise Munroe, MD   No Known Allergies Review of Systems  Constitutional: Positive for activity change. Negative for appetite change and fever.  Respiratory: Negative for chest tightness and shortness of breath.   Neurological: Positive for seizures and weakness.  Psychiatric/Behavioral: Negative for agitation.  All other systems reviewed and are negative.   Physical Exam Constitutional:      General: She is not in acute distress.    Appearance: She is obese.  Pulmonary:     Effort: No respiratory distress.  Skin:    General: Skin is warm and dry.  Neurological:     Mental Status: She is alert and oriented to person, place, and time.     Motor: Weakness present.  Psychiatric:        Speech: Speech is delayed.        Behavior: Behavior is cooperative.        Cognition and Memory: Cognition normal.     Vital Signs: BP 110/69   Pulse 63   Temp 97.7 F (36.5 C) (Oral)   Resp 19   Ht 5' (1.524 m)   Wt 66 kg   SpO2 99%   BMI 28.42 kg/m          SpO2: SpO2: 99 % O2 Device:SpO2: 99 % O2 Flow Rate: .   IO:  Intake/output summary:   Intake/Output Summary (Last 24 hours) at 03/15/2020 1345 Last data filed at 03/15/2020 1200 Gross per 24 hour  Intake 2400 ml  Output 950 ml  Net 1450 ml    LBM:   Baseline Weight: Weight: 66 kg Most recent weight: Weight: 66 kg     Palliative Assessment/Data: PPS 50%     Time In: 1300 Time Out: 1410 Time Total: 70 minutes  Greater than 50%  of this time was spent counseling and coordinating care related to the above assessment and plan.  Signed by: Lin Landsman, NP   Please contact Palliative Medicine Team phone at [phone] for questions and concerns.  For individual provider: See Shea Evans

## 2020-03-16 DIAGNOSIS — Z789 Other specified health status: Secondary | ICD-10-CM

## 2020-03-16 DIAGNOSIS — Z7189 Other specified counseling: Secondary | ICD-10-CM

## 2020-03-16 DIAGNOSIS — E872 Acidosis: Secondary | ICD-10-CM

## 2020-03-16 DIAGNOSIS — I480 Paroxysmal atrial fibrillation: Secondary | ICD-10-CM

## 2020-03-16 DIAGNOSIS — Z66 Do not resuscitate: Secondary | ICD-10-CM

## 2020-03-16 LAB — CBC
HCT: 30.8 % — ABNORMAL LOW (ref 36.0–46.0)
Hemoglobin: 10.4 g/dL — ABNORMAL LOW (ref 12.0–15.0)
MCH: 29.2 pg (ref 26.0–34.0)
MCHC: 33.8 g/dL (ref 30.0–36.0)
MCV: 86.5 fL (ref 80.0–100.0)
Platelets: 228 10*3/uL (ref 150–400)
RBC: 3.56 MIL/uL — ABNORMAL LOW (ref 3.87–5.11)
RDW: 12.5 % (ref 11.5–15.5)
WBC: 7.9 10*3/uL (ref 4.0–10.5)
nRBC: 0 % (ref 0.0–0.2)

## 2020-03-16 LAB — BASIC METABOLIC PANEL
Anion gap: 12 (ref 5–15)
BUN: 39 mg/dL — ABNORMAL HIGH (ref 8–23)
CO2: 20 mmol/L — ABNORMAL LOW (ref 22–32)
Calcium: 8.7 mg/dL — ABNORMAL LOW (ref 8.9–10.3)
Chloride: 107 mmol/L (ref 98–111)
Creatinine, Ser: 1.67 mg/dL — ABNORMAL HIGH (ref 0.44–1.00)
GFR calc Af Amer: 37 mL/min — ABNORMAL LOW (ref >60)
GFR calc non Af Amer: 32 mL/min — ABNORMAL LOW (ref >60)
Glucose, Bld: 258 mg/dL — ABNORMAL HIGH (ref 70–99)
Potassium: 3.8 mmol/L (ref 3.5–5.1)
Sodium: 139 mmol/L (ref 135–145)

## 2020-03-16 LAB — MAGNESIUM: Magnesium: 1.3 mg/dL — ABNORMAL LOW (ref 1.7–2.4)

## 2020-03-16 LAB — PHOSPHORUS: Phosphorus: 3 mg/dL (ref 2.5–4.6)

## 2020-03-16 LAB — ALBUMIN: Albumin: 3.2 g/dL — ABNORMAL LOW (ref 3.5–5.0)

## 2020-03-16 LAB — PROTIME-INR
INR: 2.7 — ABNORMAL HIGH (ref 0.8–1.2)
Prothrombin Time: 27.4 seconds — ABNORMAL HIGH (ref 11.4–15.2)

## 2020-03-16 LAB — GLUCOSE, CAPILLARY
Glucose-Capillary: 138 mg/dL — ABNORMAL HIGH (ref 70–99)
Glucose-Capillary: 148 mg/dL — ABNORMAL HIGH (ref 70–99)
Glucose-Capillary: 174 mg/dL — ABNORMAL HIGH (ref 70–99)
Glucose-Capillary: 177 mg/dL — ABNORMAL HIGH (ref 70–99)
Glucose-Capillary: 232 mg/dL — ABNORMAL HIGH (ref 70–99)
Glucose-Capillary: 234 mg/dL — ABNORMAL HIGH (ref 70–99)

## 2020-03-16 LAB — URINE CULTURE: Culture: NO GROWTH

## 2020-03-16 MED ORDER — SODIUM CHLORIDE 0.9 % IV SOLN
INTRAVENOUS | Status: DC | PRN
  Administered 2020-03-16: 250 mL via INTRAVENOUS

## 2020-03-16 MED ORDER — INSULIN ASPART 100 UNIT/ML ~~LOC~~ SOLN
0.0000 [IU] | Freq: Three times a day (TID) | SUBCUTANEOUS | Status: DC
  Administered 2020-03-16: 3 [IU] via SUBCUTANEOUS
  Administered 2020-03-16: 1 [IU] via SUBCUTANEOUS
  Administered 2020-03-17: 2 [IU] via SUBCUTANEOUS
  Administered 2020-03-17 – 2020-03-18 (×4): 5 [IU] via SUBCUTANEOUS
  Administered 2020-03-19: 2 [IU] via SUBCUTANEOUS
  Administered 2020-03-19: 1 [IU] via SUBCUTANEOUS

## 2020-03-16 MED ORDER — WARFARIN SODIUM 5 MG PO TABS
5.0000 mg | ORAL_TABLET | Freq: Once | ORAL | Status: AC
  Administered 2020-03-16: 5 mg via ORAL
  Filled 2020-03-16: qty 1

## 2020-03-16 MED ORDER — WARFARIN - PHARMACIST DOSING INPATIENT: Freq: Every day | Status: DC

## 2020-03-16 MED ORDER — CEPHALEXIN 250 MG PO CAPS: 500.0000 mg | ORAL_CAPSULE | Freq: Four times a day (QID) | ORAL | Status: DC

## 2020-03-16 MED ORDER — CEPHALEXIN 250 MG PO CAPS
500.0000 mg | ORAL_CAPSULE | Freq: Two times a day (BID) | ORAL | Status: DC
  Administered 2020-03-16: 500 mg via ORAL
  Filled 2020-03-16: qty 2

## 2020-03-16 MED ORDER — MAGNESIUM SULFATE 2 GM/50ML IV SOLN
2.0000 g | Freq: Once | INTRAVENOUS | Status: AC
  Administered 2020-03-16: 2 g via INTRAVENOUS
  Filled 2020-03-16: qty 50

## 2020-03-16 MED ORDER — PANTOPRAZOLE SODIUM 40 MG PO TBEC
40.0000 mg | DELAYED_RELEASE_TABLET | Freq: Every day | ORAL | Status: DC
  Administered 2020-03-17 – 2020-03-19 (×3): 40 mg via ORAL
  Filled 2020-03-16 (×3): qty 1

## 2020-03-16 MED ORDER — CHLORHEXIDINE GLUCONATE CLOTH 2 % EX PADS
6.0000 | MEDICATED_PAD | Freq: Every day | CUTANEOUS | Status: DC
  Administered 2020-03-18: 6 via TOPICAL

## 2020-03-16 NOTE — Progress Notes (Signed)
Family Medicine Teaching Service Daily Progress Note Intern Pager: [phone]  Patient name: Lejla Moeser Medical record number: [id number] Date of birth: 23-Jul-1955 Age: 64 y.o. Gender: female  Primary Care Provider: Patient, No Pcp Per Consultants: Palliative, Nephrology, PCCM Code Status: DNR  Pt Overview and Major Events to Date:  Hospital Day: 4 03/14/2020: admitted for metabolic acidosis and AMS  Assessment and Plan: Jennalee Greaves is a 64 y.o. female who presented w/ altered mental status and found to be in acute renal failure with metabolic acidosis.  From the ED, patient was admitted to critical care service for worsening maps.  She did not require any pressors and responded to fluid resuscitation.  She was transferred to medicine service on 03/16/2020. PMHx s/f HFrEF, paroxysmal A. fib on Coumadin, diabetes, hypertension, hypothyroidism, seizures.  Palliation  GOC  Family meeting yesterday with Palliative Care.  Son plans to have patient live with him in [place] on discharge. Family would like to continue Full scope practice. DNR in place. -TOC consulted for discharge planning -f/u Chaplain consult -OOB with assistance -PT/OT for evaluation  Acute renal failure Stable. Improving, Cr 1.08 today. Voiding well, overnight output 500 cc plus 1 unmeasured void. -Nephrology following -Monitor renal function daily -Bicarb at 50cc/hr per nephrology -Strict intake and output -No nephrotoxic medications  Adrenal insufficiency?  Resolving.  Likely in the setting of hypotension, renal failure and sepsis.  -Continue to taper Solucortef (09/03-09/07)  HFpEF Stable. Last ECHO 09/21 with LVEF 55% and G1DD. Euvolemic on exam. -Continue to hold Lasix in the setting of AKI -monitor fluid status -strict intake and output  Paroxysmal a fib  Stable.  Home medication Warfarin. INR pending.  Heparin gtt discontinued and Warfarin restarted 09/04 -Continue Warfarin -Pharmacy consulted  for warfarin dosing -Daily PT/INR  Hypertension  Stable. SBP 123-153  DBP 72-90 overnight.  Asymptomatic.  Home medications include Hydralazine 100 mg TID, Ramipril 5 ,g daily, Coreg 12.5 mg daily -Continue to monitor BP -Continue to hold be meds, consider restart medications slowly as BP tolerates  Diabetes Stable. CBG 162-232 overnight/  Received 2 units insulin coverage overnight. Remains on Solucortef taper.  Home medications include Glimiperide 4 mg daily, Lantus 30 mg BID, Metformin 1000 mg BID.  Last A1c 12.9 (07/21) -Continue sSSI -Consider adding home medications slowly as diet and CBG tolerate -Continue Atorvastatin 20 mg daily  Hypothyroidism  Last TSH elevated at 12.202 (07/21)  Home medication Synthroid 125 mcg daily -repeat TSH pending -Continue Synthroid 125 mcg daily, titrate pending TSH results -Follow up outpatient  Seizure disorder  Stable.  No seizure activity overnight.  Has appointment with Neurology outpatient 09/17. Home medications Keppra 750 mg BID -Continue Keppra 750 mg BID -Seizure precautions -Continue to monitor for seizure activity  FEN/GI: -Full liquid renal diet -Bicarb gtt 150 meq at 50 cc/hr  DVT Prophylaxis -Warfarin   Disposition: Son wants patient to live with him in [place] when medically stable, TOC to discuss discharge need  Subjective:  No acute events overnight. Reports did not sleep well overnight, this is not new for her.  Appetite good.  Voiding well.  Reports LBM 2 days ago. Has not been out of bed since admission.  Objective: Temp:  [98.1 F (36.7 C)-98.7 F (37.1 C)] 98.7 F (37.1 C) (09/04 1642) Pulse Rate:  [59] 59 (09/04 0343) Resp:  [18] 18 (09/04 0343) BP: (116-145)/(68-88) 130/86 (09/04 1642) SpO2:  [94 %] 94 % (09/04 0343) Weight:  [72.6 kg] 72.6 kg (09/04 0405)  Physical Exam: General: Alert and oriented x3. No acute distress Cardiovascular: RRR, no murmurs or gallops appreciated Respiratory: CTAB, no  wheezing or crackles appreciated. Abdomen: soft, non tender, non distended.  BS present Extremities: no lower extremity edema  Laboratory: Recent Labs  Lab 03/14/20 1745 03/14/20 1745 03/14/20 2134 03/15/20 0322 03/16/20 0944  WBC 10.3  --   --  10.1 7.9  HGB 12.3   < > 13.6 11.6* 10.4*  HCT 40.0   < > 40.0 37.4 30.8*  PLT 270  --   --  257 228   < > = values in this interval not displayed.   Recent Labs  Lab 03/14/20 1745 03/14/20 1745 03/14/20 2134 03/15/20 0322 03/16/20 0944  NA 134*   < > 136 132* 139  K 5.4*   < > 5.5* 4.2 3.8  CL 104  --   --  105 107  CO2 14*  --   --  14* 20*  BUN 88*  --   --  73* 39*  CREATININE 6.71*  --   --  4.90* 1.67*  CALCIUM 9.7  --   --  8.3* 8.7*  PROT 7.8  --   --   --   --   BILITOT 0.5  --   --   --   --   ALKPHOS 41  --   --   --   --   ALT 21  --   --   --   --   AST 18  --   --   --   --   GLUCOSE 108*  --   --  218* 258*   < > = values in this interval not displayed.      Imaging/Diagnostic Tests: No results found.  Dana Allan, MD 03/16/2020, 11:30 PM PGY-2, [facility] FPTS Intern pager: [phone], text pages welcome

## 2020-03-16 NOTE — Progress Notes (Signed)
Family Medicine Teaching Service Daily Progress Note Intern Pager: [phone]  Patient name: Nichole [name] Medical record number: [id number] Date of birth: 1956-01-23 Age: 64 y.o. Gender: female  Primary Care Provider: Patient, No Pcp Per Consultants: Palliative  Code Status: DNR   Pt Overview and Major Events to Date:  Hospital Day: 3 03/14/2020: admitted for metabolic acidosis and AMS Assessment and Plan: Nichole [name] is a 64 y.o. female who presented w/ altered mental status and found to be in acute renal failure with metabolic acidosis.  From the ED, patient was admitted to critical care service for worsening maps.  She did not require any pressors and responded to fluid resuscitation.  She was transferred to medicine service on 03/16/2020. PMHx s/f HFrEF, paroxysmal A. fib on Coumadin, diabetes, hypertension, hypothyroidism, seizures.  Palliation  GOC  Palliative care consulted in setting of patient's FTT. DNR/DNI in place yesterday. GOC meeting today at 11AM via phone.  -Chaplain consulted  -Continue full scope medical treatment at this time    Acute renal failure Metabolic acidosis BMP has not yet returned this morning.  Patient's renal function has continued to improve. Baseline Cr 1.4 mg/dL.  Unclear mechanism for acute renal failure.  Possibly due to medications (Metformin, lasix and ACE inhibitor at home) versus poor p.o. intake and FTT.  UOP 1.75 L yesterday. Will do voiding trial today given great improvement in UOP.  -Nephrology on board appreciate recommendations -Continue to monitor renal function daily -bicarb gtt 100 ml/hr (continue pending BMP & renal consult ) -strict IO  -hold nephrotoxic medications  -d/c foley catheter. If no UOP, bladder scan   AMS  Metabolic encephalopathy  Likely due to uremia and acute renal failure. Patient also presented with dysphagia and reduced oral manipulation.  -full liquid diet  -OOB with assistance   UTI  CT evidence of cystitis.  Repeat UA with trace leukocytes and rare bacteria. Prior UA on 03/14/20 with few bacteria only. Patient placed on azithromycin and rocephin. Urine culture pending. Patient has been afebrile and no leukocytosis. Foley catheter in place.  -follow urine culture  -d/c foley catheter asap, voiding trial today  -hold home myrbetriq  -d/c azithromycin and rocephin.  -start keflex 500 mg BID (renally dosed) x 5 days total (9/4-9/7)  Adrenal insufficiency?  In the setting of hypotension, hypoglycemia, renal failure, and possible sepsis. Cortisol level 0321 (9/3) 86.7. Patient's blood hypoglycemia and hypotension have resolved. Will continue short taper  -solucortef taper (9/3-9/7) -IV pantoprazole   HFpEF Prior ejection fraction 35% in July 2021.  Repeat echocardiogram on 03/15/2020, with 55% ejection fraction and G1DD. At home, taking lasix 40 mg. No BLEE. Mild crackles on lungs this morning with no respiratory distress. Satting well on room air.   -holding lasix  Paroxysmal a fib  At home, on warfarin  - continuous cardiac monitoring  - d/c heparin gtt and restart warfarin per pharmacy  - PT/INR this am  Hypertension  At home, on hydralzaine 100 mg TID, ramipril 5 mg, coreg 12.5 mg. BP continues to be stable and wnl  -continue to hold BP medications   Diabetes At home, glimiperide 4 mg, lantus 30 units BID, metformin 1000 mg BID -Currently full liquid diet, titrate insulin as diet advances  -TID CBGs with sSSI.  -continue home atorvastatin 20 mg   Hypothyroidism  Last TSH 02/03/20 12.202, free T4 0.95.  -Continue home syntrhoid 125 mcg  -Continue outpatient follow up  Seizure disorder  No seizures since hospitalization.  -Continue home  Keppra 750 mg BID   FEN/GI: Full liquid diet  . Fluids:  . Electrolytes: hypomagnesemia  . Nutrition: renal diet    Access:  VTE prophylaxis: Heparin gtt . azithromycin Stopped (03/15/20 0451)  . cefTRIAXone (ROCEPHIN)  IV Stopped (03/15/20 0441)   . heparin 850 Units/hr (03/15/20 0327)  .  sodium bicarbonate (isotonic) infusion in sterile water 100 mL/hr at 03/15/20 6962   Disposition: When medically stable  TBD -     Subjective:  NAEO.   Objective: Pulse Rate:  [58-85] 60 (09/03 2230) Cardiac Rhythm: A-V Sequential paced (09/04 0008) Resp:  [10-28] 16 (09/03 2230) BP: (89-135)/(46-88) 114/72 (09/03 2230) SpO2:  [93 %-99 %] 93 % (09/03 2230) Intake/Output      09/03 0701 - 09/04 0700   P.O. 120   IV Piggyback 50   Total Intake(mL/kg) 170 (2.6)   Urine (mL/kg/hr) 1750 (1.1)   Total Output 1750   Net -1580           Physical Exam: General: NAD, non-toxic, well-appearing, sitting comfortably in bed   HEENT: [place]/AT. PERRLA. EOMI.  Cardiovascular: RRR, normal S1, S2. B/L 2+ RP. no BLEE Respiratory: crackles on b/l bases. Good air movement throughout. No IWOB.  Abdomen: + BS. NT, ND, soft to palpation.  Extremities: Warm and well perfused. Moving spontaneously.  Integumentary: No obvious rashes, lesions, trauma on general exam. Neuro: A & O x4. CN grossly intact. No FND. More alert today than what she appeared in the ED. Smiling.   Laboratory: I have personally read and reviewed all labs and imaging studies.  CBC: Recent Labs  Lab 03/09/20 1403 03/09/20 1403 03/14/20 1745 03/14/20 2134 03/15/20 0322  WBC 10.8*  --  10.3  --  10.1  NEUTROABS 7.8*  --  8.1*  --   --   HGB 13.1   < > 12.3 13.6 11.6*  HCT 41.3   < > 40.0 40.0 37.4  MCV 91.4  --  92.2  --  92.1  PLT 339  --  270  --  257   < > = values in this interval not displayed.   CMP: Recent Labs  Lab 03/09/20 1403 03/09/20 1403 03/14/20 1745 03/14/20 2134 03/15/20 0322  NA 139   < > 134* 136 132*  K 5.2*   < > 5.4* 5.5* 4.2  CL 107  --  104  --  105  CO2 20*  --  14*  --  14*  GLUCOSE 177*  --  108*  --  218*  BUN 43*  --  88*  --  73*  CREATININE 1.65*  --  6.71*  --  4.90*  CALCIUM 10.6*  --  9.7  --  8.3*  MG  --   --   --   --  1.3*  PHOS   --   --   --   --  4.6  ALBUMIN  --   --  4.1  --   --    < > = values in this interval not displayed.   CBG: Recent Labs  Lab 03/15/20 1151 03/15/20 1525 03/15/20 1825 03/15/20 2017 03/16/20 0031  GLUCAP 238* 131* 97 144* 138*   Micro: Covid Negative  Recent Results (from the past 240 hour(s))  Blood culture (routine x 2)     Status: None (Preliminary result)   Collection Time: 03/14/20  6:25 PM   Specimen: BLOOD  Result Value Ref Range Status   Specimen Description BLOOD LEFT  ANTECUBITAL  Final   Special Requests   Final    BOTTLES DRAWN AEROBIC AND ANAEROBIC Blood Culture adequate volume   Culture   Final    NO GROWTH < 12 HOURS Performed at [facility], [address] [place], [place] [address]    Report Status PENDING  Incomplete  SARS Coronavirus 2 by RT PCR (hospital order, performed in [facility] hospital lab) Nasopharyngeal Nasopharyngeal Swab     Status: None   Collection Time: 03/14/20  7:37 PM   Specimen: Nasopharyngeal Swab  Result Value Ref Range Status   SARS Coronavirus 2 NEGATIVE NEGATIVE Final    Comment: (NOTE) SARS-CoV-2 target nucleic acids are NOT DETECTED.  The SARS-CoV-2 RNA is generally detectable in upper and lower respiratory specimens during the acute phase of infection. The lowest concentration of SARS-CoV-2 viral copies this assay can detect is 250 copies / mL. A negative result does not preclude SARS-CoV-2 infection and should not be used as the sole basis for treatment or other patient management decisions.  A negative result may occur with improper specimen collection / handling, submission of specimen other than nasopharyngeal swab, presence of viral mutation(s) within the areas targeted by this assay, and inadequate number of viral copies (<250 copies / mL). A negative result must be combined with clinical observations, patient history, and epidemiological information.  Fact Sheet for Patients:    BoilerBrush.com.cy  Fact Sheet for Healthcare Providers: https://pope.com/  This test is not yet approved or  cleared by the [place] FDA and has been authorized for detection and/or diagnosis of SARS-CoV-2 by FDA under an Emergency Use Authorization (EUA).  This EUA will remain in effect (meaning this test can be used) for the duration of the COVID-19 declaration under Section 564(b)(1) of the Act, 21 U.S.C. section 360bbb-3(b)(1), unless the authorization is terminated or revoked sooner.  Performed at [facility], [address] [place], [place] [address]   Blood culture (routine x 2)     Status: None (Preliminary result)   Collection Time: 03/14/20  8:42 PM   Specimen: BLOOD  Result Value Ref Range Status   Specimen Description BLOOD SITE NOT SPECIFIED  Final   Special Requests   Final    BOTTLES DRAWN AEROBIC AND ANAEROBIC Blood Culture adequate volume   Culture   Final    NO GROWTH < 12 HOURS Performed at [facility], [address], [place], [place] [address]    Report Status PENDING  Incomplete     Imaging/Diagnostic Tests: DG Chest 2 View  Result Date: 03/14/2020 CLINICAL DATA:  Cough and chills. EXAM: CHEST - 2 VIEW COMPARISON:  February 03, 2020 FINDINGS: A multi lead AICD is in place. Mild, stable increased bronchovascular lung markings are seen without evidence of acute infiltrate, pleural effusion or pneumothorax. The cardiac silhouette is moderately enlarged and unchanged in size. The visualized skeletal structures are unremarkable. IMPRESSION: Stable cardiomegaly without evidence of acute or active cardiopulmonary disease. Electronically Signed   By: Aram Candela M.D.   On: 03/14/2020 18:41   CT Head Wo Contrast  Result Date: 03/14/2020 CLINICAL DATA:  Altered level of consciousness, delirium EXAM: CT HEAD WITHOUT CONTRAST TECHNIQUE: Contiguous axial images were obtained from the base of the skull  through the vertex without intravenous contrast. COMPARISON:  02/03/2020 FINDINGS: Brain: Chronic hypodensities are seen within the bilateral basal ganglia, bilateral thalami, and periventricular white matter consistent with chronic small vessel ischemic changes. Focal area of encephalomalacia  are right posterior cerebellar hemisphere also likely consistent with chronic ischemic change. No evidence of acute infarct or hemorrhage. The lateral ventricles and midline structures are stable. No acute extra-axial fluid collections. No mass effect. Vascular: No hyperdense vessel or unexpected calcification. Skull: Normal. Negative for fracture or focal lesion. Sinuses/Orbits: No acute finding. Other: None. IMPRESSION: 1. Stable chronic ischemic changes as above. No acute intracranial process. Electronically Signed   By: Sharlet Salina M.D.   On: 03/14/2020 21:05   ECHOCARDIOGRAM COMPLETE  Result Date: 03/15/2020    ECHOCARDIOGRAM REPORT   Patient Name:   MAILE LINFORD Date of Exam: 03/15/2020 Medical Rec #:  [id number]     Height:       60.0 in Accession #:    [id number]    Weight:       145.5 lb Date of Birth:  [birth year]      BSA:          1.631 m Patient Age:    64 years      BP:           124/88 mmHg Patient Gender: F             HR:           60 bpm. Exam Location:  Inpatient Procedure: 2D Echo and Intracardiac Opacification Agent Indications:    Abnormal ECG 794.31 / R94.31  History:        Patient has prior history of Echocardiogram examinations, most                 recent 02/05/2020. CHF, Stroke, Arrythmias:Atrial Fibrillation,                 Signs/Symptoms:Altered mental status; Risk Factors:Hypertension                 and Diabetes.  Sonographer:    Leta Jungling RDCS Referring Phys: [phone] LAURA R GLEASON IMPRESSIONS  1. Left ventricular ejection fraction, by estimation, is 55 to 60%. The left ventricle has normal function. The left ventricle has no regional wall motion abnormalities. Left ventricular  diastolic parameters are consistent with Grade I diastolic dysfunction (impaired relaxation).  2. Right ventricular systolic function is normal. The right ventricular size is normal. There is normal pulmonary artery systolic pressure.  3. The mitral valve is normal in structure. Trivial mitral valve regurgitation. No evidence of mitral stenosis.  4. The aortic valve is normal in structure. Aortic valve regurgitation is not visualized. No aortic stenosis is present.  5. The inferior vena cava is normal in size with greater than 50% respiratory variability, suggesting right atrial pressure of 3 mmHg. Comparison(s): Prior images reviewed side by side. Left ventricular systolic function has improved (but EF was probably underestimated on previous report). FINDINGS  Left Ventricle: Left ventricular ejection fraction, by estimation, is 55 to 60%. The left ventricle has normal function. The left ventricle has no regional wall motion abnormalities. Definity contrast agent was given IV to delineate the left ventricular  endocardial borders. The left ventricular internal cavity size was normal in size. There is no left ventricular hypertrophy. Left ventricular diastolic parameters are consistent with Grade I diastolic dysfunction (impaired relaxation). Indeterminate filling pressures. Right Ventricle: The right ventricular size is normal. No increase in right ventricular wall thickness. Right ventricular systolic function is normal. There is normal pulmonary artery systolic pressure. The tricuspid regurgitant velocity is 1.66 m/s, and  with an assumed right atrial pressure of 8 mmHg, the estimated right ventricular  systolic pressure is 19.0 mmHg. Left Atrium: Left atrial size was normal in size. Right Atrium: Right atrial size was normal in size. Pericardium: There is no evidence of pericardial effusion. Mitral Valve: The mitral valve is normal in structure. Normal mobility of the mitral valve leaflets. Trivial mitral valve  regurgitation. No evidence of mitral valve stenosis. Tricuspid Valve: The tricuspid valve is normal in structure. Tricuspid valve regurgitation is mild . No evidence of tricuspid stenosis. Aortic Valve: The aortic valve is normal in structure. Aortic valve regurgitation is not visualized. No aortic stenosis is present. Pulmonic Valve: The pulmonic valve was normal in structure. Pulmonic valve regurgitation is not visualized. No evidence of pulmonic stenosis. Aorta: The aortic root is normal in size and structure. Venous: The inferior vena cava is normal in size with greater than 50% respiratory variability, suggesting right atrial pressure of 3 mmHg. IAS/Shunts: No atrial level shunt detected by color flow Doppler. Additional Comments: A pacer wire is visualized.  LEFT VENTRICLE PLAX 2D LVIDd:         5.30 cm      Diastology LVIDs:         4.00 cm      LV e' lateral:   2.51 cm/s LV PW:         0.90 cm      LV E/e' lateral: 15.3 LV IVS:        0.90 cm      LV e' medial:    3.32 cm/s LVOT diam:     2.00 cm      LV E/e' medial:  11.6 LV SV:         46 LV SV Index:   28 LVOT Area:     3.14 cm  LV Volumes (MOD) LV vol d, MOD A2C: 152.5 ml LV vol d, MOD A4C: 101.0 ml LV vol s, MOD A2C: 52.3 ml LV vol s, MOD A4C: 49.9 ml LV SV MOD A2C:     100.2 ml LV SV MOD A4C:     101.0 ml LV SV MOD BP:      81.0 ml RIGHT VENTRICLE RV S prime:     9.73 cm/s TAPSE (M-mode): 0.8 cm LEFT ATRIUM             Index LA diam:        2.80 cm 1.72 cm/m LA Vol (A2C):   64.4 ml 39.50 ml/m LA Vol (A4C):   51.1 ml 31.34 ml/m LA Biplane Vol: 57.7 ml 35.39 ml/m  AORTIC VALVE LVOT Vmax:   79.50 cm/s LVOT Vmean:  58.300 cm/s LVOT VTI:    0.145 m  AORTA Ao Root diam: 3.20 cm MITRAL VALVE               TRICUSPID VALVE MV Area (PHT): 2.48 cm    TR Peak grad:   11.0 mmHg MV Decel Time: 306 msec    TR Vmax:        166.00 cm/s MV E velocity: 38.40 cm/s MV A velocity: 51.00 cm/s  SHUNTS MV E/A ratio:  0.75        Systemic VTI:  0.14 m                             Systemic Diam: 2.00 cm Rachelle HoraMihai Croitoru MD Electronically signed by Thurmon FairMihai Croitoru MD Signature Date/Time: 03/15/2020/1:55:40 PM    Final    CT Renal Stone Study  Result Date: 03/14/2020  CLINICAL DATA:  New onset back pain, concern for obstructive uropathy EXAM: CT ABDOMEN AND PELVIS WITHOUT CONTRAST TECHNIQUE: Multidetector CT imaging of the abdomen and pelvis was performed following the standard protocol without IV contrast. COMPARISON:  Chest radiograph 03/14/2020 FINDINGS: Lower chest: Lung bases are clear. Cardiomegaly with pacer leads in the coronary sinus, right atrium and right ventricular apex. Trace pericardial fluid is likely within physiologic normal. Lower thoracic aortic atherosclerosis. Hepatobiliary: No focal liver abnormality is visible on this unenhanced CT. Smooth liver surface contour. Normal liver attenuation. Patient is post cholecystectomy. Slight prominence of the biliary tree likely related to reservoir effect. No calcified intraductal gallstones. Pancreas: Partial fatty replacement of the pancreas. No pancreatic ductal dilatation or surrounding inflammatory changes. Spleen: Normal in size. No concerning splenic lesions. Adrenals/Urinary Tract: Normal adrenal glands. Kidneys normally located. Asymmetric left upper and interpolar scarring. No visible suspicious renal lesions. No perinephric stranding or inflammatory changes are seen. No obstructive urolithiasis or hydronephrosis is seen. Urinary bladder is decompressed by inflated Foley catheter. Mild wall thickening is nonspecific given underdistention. Few foci of gas within the bladder lumen likely related to instrumentation. Stomach/Bowel: Distal esophagus, stomach and duodenal sweep are unremarkable. No small bowel wall thickening or dilatation. No evidence of obstruction. Appendix is not visualized. No focal inflammation the vicinity of the cecum to suggest an occult appendicitis. No colonic dilatation or wall thickening.  Vascular/Lymphatic: Atherosclerotic calcifications within the abdominal aorta and branch vessels. No aneurysm or ectasia. No enlarged abdominopelvic lymph nodes. None a career up by high Reproductive: Patient appears to be post hysterectomy with quiescent retained ovarian tissues. No concerning adnexal lesions. Other: No abdominopelvic free fluid or free gas. No bowel containing hernias. Ventral diastasis recti. Musculoskeletal: Mild degenerative changes in the spine, hips and pelvis. No acute osseous abnormality or suspicious osseous lesion. IMPRESSION: 1. No obstructive urolithiasis or hydronephrosis. 2. Asymmetric left upper and interpolar scarring. 3. Bladder is decompressed by inflated Foley catheter. Mild wall thickening is nonspecific given underdistention. Consider correlation with urinalysis to exclude cystitis. 4. Cardiomegaly. 5. Aortic Atherosclerosis (ICD10-I70.0). Electronically Signed   By: Kreg Shropshire M.D.   On: 03/14/2020 21:11    EKG Interpretation  Date/Time:  Thursday March 14 2020 17:53:20 EDT Ventricular Rate:  65 PR Interval:  192 QRS Duration: 102 QT Interval:  464 QTC Calculation: 482 R Axis:   -120 Text Interpretation: AV dual-paced rhythm Biventricular pacemaker detected Abnormal ECG No significant change since last tracing Confirmed by Richardean Canal [phone]) on 03/14/2020 7:44:56 PM        Procedures:   Procedure Orders     Critical Care     ED EKG     EKG     ECHOCARDIOGRAM COMPLETE  Melene Plan, MD 03/16/2020, 3:26 AM PGY-3, [facility] FPTS Intern pager: [phone], text pages welcome

## 2020-03-16 NOTE — Progress Notes (Signed)
ANTICOAGULATION CONSULT NOTE  Pharmacy Consult for heparin and warfarin Indication: atrial fibrillation  No Known Allergies  Patient Measurements: Height: 5' (152.4 cm) Weight: 72.6 kg (160 lb 0.9 oz) IBW/kg (Calculated) : 45.5 Heparin Dosing Weight: ~ 64 kg  Vital Signs: Temp: 98.1 F (36.7 C) (09/04 0343) Temp Source: Oral (09/04 0343) BP: 132/73 (09/04 0343) Pulse Rate: 59 (09/04 0343)  Labs: Recent Labs    03/14/20 1745 03/14/20 1745 03/14/20 2134 03/15/20 0322 03/15/20 1117  HGB 12.3   < > 13.6 11.6*  --   HCT 40.0  --  40.0 37.4  --   PLT 270  --   --  257  --   LABPROT 18.5*  --   --   --   --   INR 1.6*  --   --   --   --   HEPARINUNFRC  --   --   --   --  0.54  CREATININE 6.71*  --   --  4.90*  --    < > = values in this interval not displayed.    Estimated Creatinine Clearance: 10.3 mL/min (A) (by C-G formula based on SCr of 4.9 mg/dL (H)).   Medical History: Past Medical History:  Diagnosis Date   CHF (congestive heart failure) (HCC)    Diabetes mellitus without complication (HCC)    Hypertension    Hypothyroidism    Paroxysmal atrial fibrillation (HCC)    Seizures (HCC)     Medications:  Infusions:   heparin 850 Units/hr (03/15/20 0327)    sodium bicarbonate (isotonic) infusion in sterile water 100 mL/hr at 03/16/20 4193    Assessment: 64 yo female with afib and hx of CVA, on chronic Coumadin admitted with AMS and elevated Scr. PTA warfarin dose 6mg  by mouth daily, last dose 9/1, INR 1.6 on admission. Warfarin held initially for potential dialysis access, heparin started.   Initial heparin level therapeutic on 850 units/hr, no infusion issues. LFTs WNL, Scr improving but still elevated. INR now 2.7 and patient may have UTI. No bleeding noted.   Goal of Therapy:  INR 2-3 Heparin level 0.3-0.7 units/ml Monitor platelets by anticoagulation protocol: Yes   Plan:  Continue heparin gtt at 850 units/hr until time of warfarin  administration  Start warfarin 5 mg x1 Monitor PT/INR, CBC, s/s of bleeding  11/1, PharmD PGY1 Acute Care Pharmacy Resident Phone: [phone] 03/16/2020 7:10 AM  Please check [email or web address] for unit specific pharmacy phone numbers.

## 2020-03-16 NOTE — Progress Notes (Signed)
Daily Progress Note   Patient Name: Nichole [name]       Date: 03/16/2020 DOB: 04-17-1956  Age: 64 y.o. MRN#: [id number] Attending Physician: Martyn Malay, MD Primary Care Physician: Patient, No Pcp Per Admit Date: 03/14/2020  Reason for Consultation/Follow-up: Establishing goals of care  Subjective: Chart review performed. Received report from primary RN - no acute concerns.   Went to visit patient at bedside - daughter/Valerie present. Patient was lying in bed awake, alert, oriented, and able to participate in conversation. Patient denied pain or shortness of breath. No signs or non-verbal gestures of pain or discomfort noted. No respiratory distress, increased work of breathing, or secretions noted.   Met with patient and daughter/Valerie in person and patient's son/Eric via facetime to discuss patient's diagnosis, prognosis, GOC, EOL wishes, disposition, and options.  I introduced Palliative Medicine as specialized medical care for people living with serious illness. It focuses on providing relief from the symptoms and stress of a serious illness. The goal is to improve quality of life for both the patient and the family.  I reviewed the patient's current medical/clinical situation and provided medical updates that were available as of this morning at 11:00am including, but not limited to: SLP recommendations, no indication for dialysis at this time, monitoring kidney function, echo results from 9/3 compared to past echo results, current medication management, UTI.  We discussed patient's current illness and what it means in the larger context of patient's on-going co-morbidities.  Natural disease trajectory and expectations at EOL were discussed. Discussed the patient's multiple hospital  readmissions/ED visits and educated that the patient's CHF and CKD were non-curable progressive diseases.   I attempted to elicit values and goals of care important to the patient. The difference between aggressive medical intervention and comfort care was discussed in detail and considered in light of the patient's goals of care. I introduced the concept of a comfort path to patient and family and encouraged the patient to think about at what point she would want to stop aggressive medical interventions and focus on helping her feel as best she can for as long as she can outside the hospital, if that were her goal. When I asked the patient how she felt about the options, she became tearful and stated "I am tired" and "I'm not afraid." Education  and discussion was had on hospice philosophy and services. Family requested prognostication information and that was discussed. Outpatient Palliative Care was also discussed. The family was supportive of the patient's feelings. Emotional support was provided to patient and family.   Patient and family requested time to process information given to them today. Patient wanted to continue full scope medical treatment while having time to continue conversation with her family today.  Discussed with patient/family the importance of continued conversation with each other and the medical providers regarding overall plan of care and treatment options, ensuring decisions are within the context of the patient's values and GOCs.    Questions and concerns were addressed. The family was encouraged to call with questions or concerns. PMT card was provided.   5:00 PM  Son/Eric called with an update on the family and patient's thoughts. The patient wants to continue full scope medical treatment. Randall Hiss informed me that the family is agreeable to have the patient discharge home with him instead of returning to live with the patient's daughter. Eric lives in [place], [place] and he  had questions regarding: how the transition from hospital to [place] would work, what help is available to care for the patient at home, and if they could be connected with outpatient Palliative Care in [place].   Discussed home health aids as a possibility for home support - he was interested in this and wanted more information. Also discussed briefly non-emergent transport options. Explained that I would notify our [facility] team who would reach out to him and provided more specific information on all of these topics - he was appreciative and agreeable.   Length of Stay: 1  Current Medications: Scheduled Meds:  . atorvastatin  20 mg Oral q1800  . cephALEXin  500 mg Oral Q12H  . Chlorhexidine Gluconate Cloth  6 each Topical Daily  . hydrocortisone sod succinate (SOLU-CORTEF) inj  50 mg Intravenous Q12H   Followed by  . [START ON 03/18/2020] hydrocortisone sod succinate (SOLU-CORTEF) inj  50 mg Intravenous Daily  . insulin aspart  0-9 Units Subcutaneous TID WC  . levETIRAcetam  750 mg Oral BID  . levothyroxine  125 mcg Oral QAC breakfast  . [START ON 03/17/2020] pantoprazole  40 mg Oral Daily    Continuous Infusions: . heparin 850 Units/hr (03/16/20 1039)  .  sodium bicarbonate (isotonic) infusion in sterile water 50 mL/hr at 03/16/20 1039    PRN Meds: docusate sodium, polyethylene glycol  Physical Exam Vitals and nursing note reviewed.  Constitutional:      General: She is not in acute distress. Pulmonary:     Effort: No respiratory distress.  Skin:    General: Skin is warm and dry.  Neurological:     Mental Status: She is alert and oriented to person, place, and time.     Motor: Weakness present.  Psychiatric:        Mood and Affect: Affect is tearful.        Cognition and Memory: Cognition normal.             Vital Signs: BP (!) 145/88 (BP Location: Left Arm)   Pulse (!) 59   Temp 98.6 F (37 C) (Skin)   Resp 18   Ht 5' (1.524 m)   Wt 72.6 kg   SpO2 94%   BMI 31.26 kg/m  SpO2:  SpO2: 94 % O2 Device: O2 Device: Room Air O2 Flow Rate:    Intake/output summary:   Intake/Output Summary (Last 24 hours)  at 03/16/2020 1206 Last data filed at 03/16/2020 5009 Gross per 24 hour  Intake 1838.89 ml  Output 1900 ml  Net -61.11 ml   LBM:   Baseline Weight: Weight: 66 kg Most recent weight: Weight: 72.6 kg       Palliative Assessment/Data: PPS 40%    Flowsheet Rows     Most Recent Value  Intake Tab  Referral Department Critical care  Unit at Time of Referral ER  Palliative Care Primary Diagnosis Nephrology  Date Notified 03/15/20  Palliative Care Type New Palliative care  Reason for referral Clarify Goals of Care  Date of Admission 03/14/20  Date first seen by Palliative Care 03/15/20  # of days Palliative referral response time 0 Day(s)  # of days IP prior to Palliative referral 1  Clinical Assessment  Psychosocial & Spiritual Assessment  Palliative Care Outcomes  Patient/Family meeting held? Yes  Who was at the meeting? patient  Palliative Care Outcomes ACP counseling assistance, Completed durable DNR, Changed CPR status, Provided psychosocial or spiritual support, Provided advance care planning, Clarified goals of care  Patient/Family wishes: Interventions discontinued/not started  Mechanical Ventilation      Patient Active Problem List   Diagnosis Date Noted  . Renal failure 03/15/2020  . Elevated lactic acid level   . Hyperkalemia   . Hypoglycemia   . Palliative care by specialist   . Goals of care, counseling/discussion   . Advanced directives, counseling/discussion   . Generalized weakness   . Unspecified atrial fibrillation ([place]) 02/04/2020  . History of cardioembolic cerebrovascular accident (CVA) 02/04/2020  . Chronic systolic CHF (congestive heart failure) ([place]) 02/04/2020  . CAD (coronary artery disease) 02/04/2020  . Essential hypertension 02/04/2020  . Dyslipidemia 02/04/2020  . Hypothyroidism 02/04/2020  . Seizure ([place]) 02/03/2020     Palliative Care Assessment & Plan   Patient Profile: 64 y.o. female  with past medical history of seizures, atrial fibrillation, hypertension, diabetes mellitus, CHF (EF 30-40%), CVA on warfarin admitted on 03/14/2020 with acute renal failure, metabolic acidosis, acute metabolic encephalopathy, hypoglycemia.  Assessment: Acute kidney injury UTI History of seizure CHF Metabolic acidosis Acute metabolic encephalopathy Atrial fibrillation Diabetes Mellitus Generalized weakness  Recommendations/Plan: Continue current full scope medical treatment Continue DNR/DNI as previously documented Family wants patient to live with son/Eric on discharge instead of returning to live with the patient's daughter/Valerie. Eric lives in [place], [place]. He needs more information on: transport options, outpatient Palliative Care connections in that area, and home health aids to come and assist with the patient's care - TOC notified and consult placed PMT will continue to follow holistically   Goals of Care and Additional Recommendations: Limitations on Scope of Treatment: Full Scope Treatment  Code Status:    Code Status Orders  (From admission, onward)         Start     Ordered   03/15/20 1303  Do not attempt resuscitation (DNR)  Continuous       Question Answer Comment  In the event of cardiac or respiratory ARREST Do not call a "code blue"   In the event of cardiac or respiratory ARREST Do not perform Intubation, CPR, defibrillation or ACLS   In the event of cardiac or respiratory ARREST Use medication by any route, position, wound care, and other measures to relive pain and suffering. May use oxygen, suction and manual treatment of airway obstruction as needed for comfort.      03/15/20 1302        Code Status  History    Date Active Date Inactive Code Status Order ID Comments User Context   03/15/2020 0142 03/15/2020 1302 Full Code [id number]  Gleason, Sissy Hoff ED   02/03/2020 2232  02/08/2020 0016 Full Code [id number]  Mansy, Arvella Merles, MD ED   Advance Care Planning Activity      Prognosis:  Unable to determine  Discharge Planning: Home with Palliative Services home with son/Eric in [place]  Care plan was discussed with primary RN, Dr. Owens Shark, Dr. Tarry Kos, Dr. Oleh Genin, [facility], patient, son, daughter   Thank you for allowing the Palliative Medicine Team to assist in the care of this patient.   Total Time 70 minutes Prolonged Time Billed  yes       Greater than 50%  of this time was spent counseling and coordinating care related to the above assessment and plan.  Lin Landsman, NP  Please contact Palliative Medicine Team phone at [phone] for questions and concerns.

## 2020-03-16 NOTE — Progress Notes (Signed)
[name] ROUNDING NOTE   Subjective:   Brief history: History of congestive heart failure systolic/diastolic dysfunction on 2D echo July 2021 with an ejection fraction of 35 to 45%., diabetes hypertension atrial fibrillation on Coumadin and seizure disorders brought in with altered mental status.  She has a baseline serum creatinine about 1.3 to 1.6 mg/dL.  And in the emergency room was found to have a creatinine of 6.7 mg/dL.  She underwent CT scan evaluation of her abdomen that did not reveal any evidence of hydronephrosis.  She underwent Foley catheter placement.  As an outpatient she was taking both ramipril 5 mg daily and Metformin 1 g twice daily.  Blood pressure 116/68 pulse 84 temperature 98.4 O2 sats 97% room air  Labs pending will order renal panel 03/16/2020 and 03/17/2020  Keflex 500 mg twice daily atorvastatin 20 mg daily, hydrocortisone 50 mg every 12 hours, Keppra 750 mg twice daily levothyroxine to 125 mcg daily, Protonix 40 mg daily  IV sodium bicarbonate 100 cc an hour    Objective:  Vital signs in last 24 hours:  Temp:  [98.1 F (36.7 C)-98.4 F (36.9 C)] 98.4 F (36.9 C) (09/04 0800) Pulse Rate:  [58-82] 59 (09/04 0343) Resp:  [10-28] 18 (09/04 0343) BP: (102-135)/(59-88) 116/68 (09/04 0800) SpO2:  [93 %-99 %] 94 % (09/04 0343) Weight:  [72.6 kg] 72.6 kg (09/04 0405)  Weight change: 6.6 kg Filed Weights   03/14/20 2213 03/16/20 0405  Weight: 66 kg 72.6 kg    Intake/Output: I/O last 3 completed shifts: In: 4238.9 [P.O.:480; I.V.:1358.9; IV Piggyback:2400] Out: 2850 [Urine:2850]   Intake/Output this shift:  No intake/output data recorded.  General: Chronically ill-appearing no obvious distress HEENT: Normocephalic atraumatic mucous membranes moist Eyes: Pupils round equal reactive no icterus or pallor Neck: Supple no thyromegaly no adenopathy Heart: Regular rate and rhythm no murmurs rubs or gallops Lungs: Tachypneic no wheezes or  rales Abdomen: Soft nontender no organosplenomegaly Extremities: Trace edema no cyanosis clubbing Skin: No skin rashes or lesions Neuro: No focal deficits   Basic Metabolic Panel: Recent Labs  Lab 03/09/20 1403 03/14/20 1745 03/14/20 2134 03/15/20 0322  NA 139 134* 136 132*  K 5.2* 5.4* 5.5* 4.2  CL 107 104  --  105  CO2 20* 14*  --  14*  GLUCOSE 177* 108*  --  218*  BUN 43* 88*  --  73*  CREATININE 1.65* 6.71*  --  4.90*  CALCIUM 10.6* 9.7  --  8.3*  MG  --   --   --  1.3*  PHOS  --   --   --  4.6    Liver Function Tests: Recent Labs  Lab 03/14/20 1745  AST 18  ALT 21  ALKPHOS 41  BILITOT 0.5  PROT 7.8  ALBUMIN 4.1   No results for input(s): LIPASE, AMYLASE in the last 168 hours. No results for input(s): AMMONIA in the last 168 hours.  CBC: Recent Labs  Lab 03/09/20 1403 03/14/20 1745 03/14/20 2134 03/15/20 0322  WBC 10.8* 10.3  --  10.1  NEUTROABS 7.8* 8.1*  --   --   HGB 13.1 12.3 13.6 11.6*  HCT 41.3 40.0 40.0 37.4  MCV 91.4 92.2  --  92.1  PLT 339 270  --  257    Cardiac Enzymes: No results for input(s): CKTOTAL, CKMB, CKMBINDEX, TROPONINI in the last 168 hours.  BNP: Invalid input(s): POCBNP  CBG: Recent Labs  Lab 03/15/20 1825 03/15/20 2017 03/16/20 0031  03/16/20 0606 03/16/20 0742  GLUCAP 97 144* 138* 177* 174*    Microbiology: Results for orders placed or performed during the hospital encounter of 03/14/20  Blood culture (routine x 2)     Status: None (Preliminary result)   Collection Time: 03/14/20  6:25 PM   Specimen: BLOOD  Result Value Ref Range Status   Specimen Description BLOOD LEFT ANTECUBITAL  Final   Special Requests   Final    BOTTLES DRAWN AEROBIC AND ANAEROBIC Blood Culture adequate volume   Culture   Final    NO GROWTH < 12 HOURS Performed at [facility], [address] [place], [place] [address]    Report Status PENDING  Incomplete  SARS Coronavirus 2 by RT PCR (hospital order, performed in [facility] hospital lab) Nasopharyngeal Nasopharyngeal Swab     Status: None   Collection Time: 03/14/20  7:37 PM   Specimen: Nasopharyngeal Swab  Result Value Ref Range Status   SARS Coronavirus 2 NEGATIVE NEGATIVE Final    Comment: (NOTE) SARS-CoV-2 target nucleic acids are NOT DETECTED.  The SARS-CoV-2 RNA is generally detectable in upper and lower respiratory specimens during the acute phase of infection. The lowest concentration of SARS-CoV-2 viral copies this assay can detect is 250 copies / mL. A negative result does not preclude SARS-CoV-2 infection and should not be used as the sole basis for treatment or other patient management decisions.  A negative result may occur with improper specimen collection / handling, submission of specimen other than nasopharyngeal swab, presence of viral mutation(s) within the areas targeted by this assay, and inadequate number of viral copies (<250 copies / mL). A negative result must be combined with clinical observations, patient history, and epidemiological information.  Fact Sheet for Patients:   BoilerBrush.com.cyhttps://www.fda.gov/media/136312/download  Fact Sheet for Healthcare Providers: https://pope.com/https://www.fda.gov/media/136313/download  This test is not yet approved or  cleared by the [place] FDA and has been authorized for detection and/or diagnosis of SARS-CoV-2 by FDA under an Emergency Use Authorization (EUA).  This EUA will remain in effect (meaning this test can be used) for the duration of the COVID-19 declaration under Section 564(b)(1) of the Act, 21 U.S.C. section 360bbb-3(b)(1), unless the authorization is terminated or revoked sooner.  Performed at [facility], [address] [place], [place] [address]   Blood culture (routine x 2)     Status: None (Preliminary result)   Collection Time: 03/14/20  8:42 PM   Specimen: BLOOD  Result Value Ref Range Status   Specimen Description BLOOD SITE NOT SPECIFIED  Final   Special Requests    Final    BOTTLES DRAWN AEROBIC AND ANAEROBIC Blood Culture adequate volume   Culture   Final    NO GROWTH < 12 HOURS Performed at [facility], [address] [place], [place] [address]    Report Status PENDING  Incomplete    Coagulation Studies: Recent Labs    03/14/20 1745  LABPROT 18.5*  INR 1.6*    Urinalysis: Recent Labs    03/14/20 2124 03/15/20 0933  COLORURINE YELLOW STRAW*  LABSPEC 1.016 1.008  PHURINE 5.0 5.0  GLUCOSEU NEGATIVE NEGATIVE  HGBUR NEGATIVE SMALL*  BILIRUBINUR NEGATIVE NEGATIVE  KETONESUR NEGATIVE NEGATIVE  PROTEINUR 30* NEGATIVE  NITRITE NEGATIVE NEGATIVE  LEUKOCYTESUR NEGATIVE TRACE*      Imaging: DG Chest 2 View  Result Date: 03/14/2020 CLINICAL DATA:  Cough and chills. EXAM: CHEST - 2 VIEW COMPARISON:  February 03, 2020 FINDINGS: A  multi lead AICD is in place. Mild, stable increased bronchovascular lung markings are seen without evidence of acute infiltrate, pleural effusion or pneumothorax. The cardiac silhouette is moderately enlarged and unchanged in size. The visualized skeletal structures are unremarkable. IMPRESSION: Stable cardiomegaly without evidence of acute or active cardiopulmonary disease. Electronically Signed   By: Aram Candela M.D.   On: 03/14/2020 18:41   CT Head Wo Contrast  Result Date: 03/14/2020 CLINICAL DATA:  Altered level of consciousness, delirium EXAM: CT HEAD WITHOUT CONTRAST TECHNIQUE: Contiguous axial images were obtained from the base of the skull through the vertex without intravenous contrast. COMPARISON:  02/03/2020 FINDINGS: Brain: Chronic hypodensities are seen within the bilateral basal ganglia, bilateral thalami, and periventricular white matter consistent with chronic small vessel ischemic changes. Focal area of encephalomalacia are right posterior cerebellar hemisphere also likely consistent with chronic ischemic change. No evidence of acute infarct or hemorrhage. The lateral ventricles and midline  structures are stable. No acute extra-axial fluid collections. No mass effect. Vascular: No hyperdense vessel or unexpected calcification. Skull: Normal. Negative for fracture or focal lesion. Sinuses/Orbits: No acute finding. Other: None. IMPRESSION: 1. Stable chronic ischemic changes as above. No acute intracranial process. Electronically Signed   By: Sharlet Salina M.D.   On: 03/14/2020 21:05   ECHOCARDIOGRAM COMPLETE  Result Date: 03/15/2020    ECHOCARDIOGRAM REPORT   Patient Name:   Nichole [name] Date of Exam: 03/15/2020 Medical Rec #:  [id number]     Height:       60.0 in Accession #:    [id number]    Weight:       145.5 lb Date of Birth:  May 28, 1956      BSA:          1.631 m Patient Age:    64 years      BP:           124/88 mmHg Patient Gender: F             HR:           60 bpm. Exam Location:  Inpatient Procedure: 2D Echo and Intracardiac Opacification Agent Indications:    Abnormal ECG 794.31 / R94.31  History:        Patient has prior history of Echocardiogram examinations, most                 recent 02/05/2020. CHF, Stroke, Arrythmias:Atrial Fibrillation,                 Signs/Symptoms:Altered mental status; Risk Factors:Hypertension                 and Diabetes.  Sonographer:    Leta Jungling RDCS Referring Phys: [phone] LAURA R GLEASON IMPRESSIONS  1. Left ventricular ejection fraction, by estimation, is 55 to 60%. The left ventricle has normal function. The left ventricle has no regional wall motion abnormalities. Left ventricular diastolic parameters are consistent with Grade I diastolic dysfunction (impaired relaxation).  2. Right ventricular systolic function is normal. The right ventricular size is normal. There is normal pulmonary artery systolic pressure.  3. The mitral valve is normal in structure. Trivial mitral valve regurgitation. No evidence of mitral stenosis.  4. The aortic valve is normal in structure. Aortic valve regurgitation is not visualized. No aortic stenosis is present.  5. The  inferior vena cava is normal in size with greater than 50% respiratory variability, suggesting right atrial pressure of 3 mmHg. Comparison(s): Prior images reviewed side by side. Left ventricular  systolic function has improved (but EF was probably underestimated on previous report). FINDINGS  Left Ventricle: Left ventricular ejection fraction, by estimation, is 55 to 60%. The left ventricle has normal function. The left ventricle has no regional wall motion abnormalities. Definity contrast agent was given IV to delineate the left ventricular  endocardial borders. The left ventricular internal cavity size was normal in size. There is no left ventricular hypertrophy. Left ventricular diastolic parameters are consistent with Grade I diastolic dysfunction (impaired relaxation). Indeterminate filling pressures. Right Ventricle: The right ventricular size is normal. No increase in right ventricular wall thickness. Right ventricular systolic function is normal. There is normal pulmonary artery systolic pressure. The tricuspid regurgitant velocity is 1.66 m/s, and  with an assumed right atrial pressure of 8 mmHg, the estimated right ventricular systolic pressure is 19.0 mmHg. Left Atrium: Left atrial size was normal in size. Right Atrium: Right atrial size was normal in size. Pericardium: There is no evidence of pericardial effusion. Mitral Valve: The mitral valve is normal in structure. Normal mobility of the mitral valve leaflets. Trivial mitral valve regurgitation. No evidence of mitral valve stenosis. Tricuspid Valve: The tricuspid valve is normal in structure. Tricuspid valve regurgitation is mild . No evidence of tricuspid stenosis. Aortic Valve: The aortic valve is normal in structure. Aortic valve regurgitation is not visualized. No aortic stenosis is present. Pulmonic Valve: The pulmonic valve was normal in structure. Pulmonic valve regurgitation is not visualized. No evidence of pulmonic stenosis. Aorta: The  aortic root is normal in size and structure. Venous: The inferior vena cava is normal in size with greater than 50% respiratory variability, suggesting right atrial pressure of 3 mmHg. IAS/Shunts: No atrial level shunt detected by color flow Doppler. Additional Comments: A pacer wire is visualized.  LEFT VENTRICLE PLAX 2D LVIDd:         5.30 cm      Diastology LVIDs:         4.00 cm      LV e' lateral:   2.51 cm/s LV PW:         0.90 cm      LV E/e' lateral: 15.3 LV IVS:        0.90 cm      LV e' medial:    3.32 cm/s LVOT diam:     2.00 cm      LV E/e' medial:  11.6 LV SV:         46 LV SV Index:   28 LVOT Area:     3.14 cm  LV Volumes (MOD) LV vol d, MOD A2C: 152.5 ml LV vol d, MOD A4C: 101.0 ml LV vol s, MOD A2C: 52.3 ml LV vol s, MOD A4C: 49.9 ml LV SV MOD A2C:     100.2 ml LV SV MOD A4C:     101.0 ml LV SV MOD BP:      81.0 ml RIGHT VENTRICLE RV S prime:     9.73 cm/s TAPSE (M-mode): 0.8 cm LEFT ATRIUM             Index LA diam:        2.80 cm 1.72 cm/m LA Vol (A2C):   64.4 ml 39.50 ml/m LA Vol (A4C):   51.1 ml 31.34 ml/m LA Biplane Vol: 57.7 ml 35.39 ml/m  AORTIC VALVE LVOT Vmax:   79.50 cm/s LVOT Vmean:  58.300 cm/s LVOT VTI:    0.145 m  AORTA Ao Root diam: 3.20 cm MITRAL VALVE  TRICUSPID VALVE MV Area (PHT): 2.48 cm    TR Peak grad:   11.0 mmHg MV Decel Time: 306 msec    TR Vmax:        166.00 cm/s MV E velocity: 38.40 cm/s MV A velocity: 51.00 cm/s  SHUNTS MV E/A ratio:  0.75        Systemic VTI:  0.14 m                            Systemic Diam: 2.00 cm Rachelle Hora Croitoru MD Electronically signed by Thurmon Fair MD Signature Date/Time: 03/15/2020/1:55:40 PM    Final    CT Renal Stone Study  Result Date: 03/14/2020 CLINICAL DATA:  New onset back pain, concern for obstructive uropathy EXAM: CT ABDOMEN AND PELVIS WITHOUT CONTRAST TECHNIQUE: Multidetector CT imaging of the abdomen and pelvis was performed following the standard protocol without IV contrast. COMPARISON:  Chest radiograph  03/14/2020 FINDINGS: Lower chest: Lung bases are clear. Cardiomegaly with pacer leads in the coronary sinus, right atrium and right ventricular apex. Trace pericardial fluid is likely within physiologic normal. Lower thoracic aortic atherosclerosis. Hepatobiliary: No focal liver abnormality is visible on this unenhanced CT. Smooth liver surface contour. Normal liver attenuation. Patient is post cholecystectomy. Slight prominence of the biliary tree likely related to reservoir effect. No calcified intraductal gallstones. Pancreas: Partial fatty replacement of the pancreas. No pancreatic ductal dilatation or surrounding inflammatory changes. Spleen: Normal in size. No concerning splenic lesions. Adrenals/Urinary Tract: Normal adrenal glands. Kidneys normally located. Asymmetric left upper and interpolar scarring. No visible suspicious renal lesions. No perinephric stranding or inflammatory changes are seen. No obstructive urolithiasis or hydronephrosis is seen. Urinary bladder is decompressed by inflated Foley catheter. Mild wall thickening is nonspecific given underdistention. Few foci of gas within the bladder lumen likely related to instrumentation. Stomach/Bowel: Distal esophagus, stomach and duodenal sweep are unremarkable. No small bowel wall thickening or dilatation. No evidence of obstruction. Appendix is not visualized. No focal inflammation the vicinity of the cecum to suggest an occult appendicitis. No colonic dilatation or wall thickening. Vascular/Lymphatic: Atherosclerotic calcifications within the abdominal aorta and branch vessels. No aneurysm or ectasia. No enlarged abdominopelvic lymph nodes. None a career up by high Reproductive: Patient appears to be post hysterectomy with quiescent retained ovarian tissues. No concerning adnexal lesions. Other: No abdominopelvic free fluid or free gas. No bowel containing hernias. Ventral diastasis recti. Musculoskeletal: Mild degenerative changes in the spine,  hips and pelvis. No acute osseous abnormality or suspicious osseous lesion. IMPRESSION: 1. No obstructive urolithiasis or hydronephrosis. 2. Asymmetric left upper and interpolar scarring. 3. Bladder is decompressed by inflated Foley catheter. Mild wall thickening is nonspecific given underdistention. Consider correlation with urinalysis to exclude cystitis. 4. Cardiomegaly. 5. Aortic Atherosclerosis (ICD10-I70.0). Electronically Signed   By: Kreg Shropshire M.D.   On: 03/14/2020 21:11     Medications:   . heparin 850 Units/hr (03/15/20 0327)  .  sodium bicarbonate (isotonic) infusion in sterile water 100 mL/hr at 03/16/20 0614   . atorvastatin  20 mg Oral q1800  . cephALEXin  500 mg Oral Q12H  . Chlorhexidine Gluconate Cloth  6 each Topical Daily  . hydrocortisone sod succinate (SOLU-CORTEF) inj  50 mg Intravenous Q12H   Followed by  . [START ON 03/18/2020] hydrocortisone sod succinate (SOLU-CORTEF) inj  50 mg Intravenous Daily  . insulin aspart  0-9 Units Subcutaneous TID WC  . levETIRAcetam  750 mg Oral BID  .  levothyroxine  125 mcg Oral QAC breakfast  . [START ON 03/17/2020] pantoprazole  40 mg Oral Daily   docusate sodium, polyethylene glycol  Assessment/ Plan:  1.Acute kidney injury.  Patient with altered mental status volume resuscitated.  Discontinued ACE inhibitor no use nonsteroidal anti-inflammatory drugs as far as we can tell.  No evidence of hydronephrosis.    Urinalysis bland  Baseline serum creatinine appears to be about 1.3 to 1.5 mg/dL. 2. Hypertension/volume  -stable at this time.  Volume resuscitation. 3.  Metabolic acidosis agree with IV sodium bicarbonate.  .  Will decrease to 50 cc an hour 4.  Congestive heart failure with diastolic and systolic dysfunction we will need to closely follow especially in the setting of volume resuscitation 5.  History of seizure disorders continues on Keppra 6.  Urinary tract infection CT evidence of cystitis antibiotics per primary  service    LOS: 1 Garnetta Buddy  :47 AM

## 2020-03-17 LAB — GLUCOSE, CAPILLARY
Glucose-Capillary: 162 mg/dL — ABNORMAL HIGH (ref 70–99)
Glucose-Capillary: 218 mg/dL — ABNORMAL HIGH (ref 70–99)
Glucose-Capillary: 260 mg/dL — ABNORMAL HIGH (ref 70–99)
Glucose-Capillary: 267 mg/dL — ABNORMAL HIGH (ref 70–99)
Glucose-Capillary: 288 mg/dL — ABNORMAL HIGH (ref 70–99)
Glucose-Capillary: 299 mg/dL — ABNORMAL HIGH (ref 70–99)

## 2020-03-17 LAB — PROTIME-INR
INR: 2.5 — ABNORMAL HIGH (ref 0.8–1.2)
Prothrombin Time: 26.5 seconds — ABNORMAL HIGH (ref 11.4–15.2)

## 2020-03-17 LAB — CBC
HCT: 31.3 % — ABNORMAL LOW (ref 36.0–46.0)
Hemoglobin: 10.5 g/dL — ABNORMAL LOW (ref 12.0–15.0)
MCH: 29.1 pg (ref 26.0–34.0)
MCHC: 33.5 g/dL (ref 30.0–36.0)
MCV: 86.7 fL (ref 80.0–100.0)
Platelets: 220 10*3/uL (ref 150–400)
RBC: 3.61 MIL/uL — ABNORMAL LOW (ref 3.87–5.11)
RDW: 12.7 % (ref 11.5–15.5)
WBC: 10.4 10*3/uL (ref 4.0–10.5)
nRBC: 0.2 % (ref 0.0–0.2)

## 2020-03-17 LAB — RENAL FUNCTION PANEL
Albumin: 3.2 g/dL — ABNORMAL LOW (ref 3.5–5.0)
Anion gap: 7 (ref 5–15)
BUN: 24 mg/dL — ABNORMAL HIGH (ref 8–23)
CO2: 27 mmol/L (ref 22–32)
Calcium: 9.1 mg/dL (ref 8.9–10.3)
Chloride: 108 mmol/L (ref 98–111)
Creatinine, Ser: 1.08 mg/dL — ABNORMAL HIGH (ref 0.44–1.00)
GFR calc Af Amer: >60 mL/min (ref >60)
GFR calc non Af Amer: 54 mL/min — ABNORMAL LOW (ref >60)
Glucose, Bld: 158 mg/dL — ABNORMAL HIGH (ref 70–99)
Phosphorus: 2.1 mg/dL — ABNORMAL LOW (ref 2.5–4.6)
Potassium: 3.3 mmol/L — ABNORMAL LOW (ref 3.5–5.1)
Sodium: 142 mmol/L (ref 135–145)

## 2020-03-17 LAB — TSH: TSH: 2.366 u[IU]/mL (ref 0.350–4.500)

## 2020-03-17 LAB — MAGNESIUM: Magnesium: 1.7 mg/dL (ref 1.7–2.4)

## 2020-03-17 MED ORDER — POTASSIUM CHLORIDE 20 MEQ PO PACK
20.0000 meq | PACK | Freq: Two times a day (BID) | ORAL | Status: AC
  Administered 2020-03-17 (×2): 20 meq via ORAL
  Filled 2020-03-17 (×2): qty 1

## 2020-03-17 MED ORDER — POLYETHYLENE GLYCOL 3350 17 G PO PACK
17.0000 g | PACK | Freq: Two times a day (BID) | ORAL | Status: DC
  Administered 2020-03-17 – 2020-03-19 (×5): 17 g via ORAL
  Filled 2020-03-17 (×5): qty 1

## 2020-03-17 MED ORDER — WARFARIN SODIUM 2 MG PO TABS
4.0000 mg | ORAL_TABLET | Freq: Once | ORAL | Status: AC
  Administered 2020-03-17: 4 mg via ORAL
  Filled 2020-03-17: qty 2

## 2020-03-17 NOTE — Progress Notes (Signed)
ANTICOAGULATION CONSULT NOTE  Pharmacy Consult for warfarin Indication: atrial fibrillation  No Known Allergies  Patient Measurements: Height: 5' (152.4 cm) Weight: 71.3 kg (157 lb 3 oz) IBW/kg (Calculated) : 45.5 Heparin Dosing Weight: ~ 64 kg  Vital Signs: Temp: 98 F (36.7 C) (09/05 0800) Temp Source: Oral (09/05 0800) BP: 120/70 (09/05 0900) Pulse Rate: 60 (09/05 0900)  Labs: Recent Labs    03/14/20 1745 03/14/20 2134 03/15/20 0322 03/15/20 0322 03/15/20 1117 03/16/20 0944 03/17/20 0537  HGB 12.3   < > 11.6*   < >  --  10.4* 10.5*  HCT 40.0   < > 37.4  --   --  30.8* 31.3*  PLT 270   < > 257  --   --  228 220  LABPROT 18.5*  --   --   --   --  27.4* 26.5*  INR 1.6*  --   --   --   --  2.7* 2.5*  HEPARINUNFRC  --   --   --   --  0.54  --   --   CREATININE 6.71*   < > 4.90*  --   --  1.67* 1.08*   < > = values in this interval not displayed.    Estimated Creatinine Clearance: 46.4 mL/min (A) (by C-G formula based on SCr of 1.08 mg/dL (H)).   Medical History: Past Medical History:  Diagnosis Date  . CHF (congestive heart failure) (HCC)   . Diabetes mellitus without complication (HCC)   . Hypertension   . Hypothyroidism   . Paroxysmal atrial fibrillation (HCC)   . Seizures (HCC)     Medications:  Infusions:  . sodium chloride 250 mL (03/16/20 2024)    Assessment: 64 yo female with afib and hx of CVA, on chronic Coumadin admitted with AMS and elevated Scr. PTA warfarin dose 6mg  by mouth daily, last dose 9/1, INR 1.6 on admission. Warfarin held initially for potential dialysis access, heparin started. Now on warfarin only.   Patient's INR is therapeutic at 2.5 however most likely have not seen effects of warfarin dose yet. No bleeding noted.   Goal of Therapy:  INR 2-3 Monitor platelets by anticoagulation protocol: Yes  Plan:  Warfarin 4 mg x1 Monitor PT/INR, CBC, s/s of bleeding  11/1, PharmD PGY1 Acute Care Pharmacy Resident Phone:  [phone] 03/17/2020 11:26 AM  Please check [email or web address] for unit specific pharmacy phone numbers.

## 2020-03-17 NOTE — Progress Notes (Signed)
Daily Progress Note   Patient Name: Nichole [name]       Date: 03/17/2020 DOB: [birth year]  Age: 64 y.o. MRN#: [id number] Attending Physician: Westley Chandler, MD Primary Care Physician: Patient, No Pcp Per Admit Date: 03/14/2020  Reason for Consultation/Follow-up: Establishing goals of care  Subjective: Chart review performed. Received report from primary RN - no acute concerns.  Went to visit patient at bedside - no family present. Patient was sitting up in chair - awake, alert, oriented, and able to participate in conversation. She had not eaten much of her breakfast - stated she was not nauseated, just not hungry. Denied pain. No signs or non-verbal gestures of pain or discomfort noted. No respiratory distress, increased work of breathing, or secretions noted.   Reviewed and discussed GOC plan from yesterday.   Introduced MOST form - discussed and completed. During conversation, discussed again that patient is high risk for rehospitalization after discharge and discussed what level of interventions she would/would not want in the future. The patient expressed that it is not her goal to be rehospitalized after discharge. She stated "I am tired," "it's been rough," "I've had a good life," "I know my kids want something different," "I don't want to hurt my children." Provided guidance, emotional support, and therapeutic listening as patient became tearful and opened up about her true wishes, which is for comfort measures. Discussed her thoughts and emotions in greater detail - reviewed comfort measures and hospice services again - she was agreeable to these services. Patient was very clear that she wanted to discharge with hospice with the goal of not being rehospitalized and did not want interventions  that would prolong her life. Discussed with patient her options for current hospitalization - she wants to be medically optimized to feel as best she can before discharge. I encouraged the patient to let her children know her thoughts. Offered to call and speak with them with her - she was appreciative.  The patient and I called son/Nichole [name] and daughter/Nichole [name] and spoke with them via speakerphone. Family was updated on discussion between patient and I and ultimately what the patient decided. Reviewed hospice philosophy and services again with family per request. Tearfully, the patient was able to speak to her children and validate her wishes to them, even though later she expressed it was  very hard for her. Nichole PortsValerie was supportive of the patient's wishes; Nichole [name] seems to be having a hard time processing and does not necessarily agree. Nichole PortsValerie and Nichole [name] stated they needed time to speak with each other to see where the patient would go now that she would be discharging with hospice - stated they would keep medical staff updated.   Encouraged patient to continue conversation with family about her goals and wishes. Provided emotional support and therapeutic listening throughout visit today.  Addressed all questions/concerns. Encouraged to call with questions/concerns. Patient and family all had PMT number.  I had a conversation with TOC shortly after visit: TOC spoke with family who stated the patient would discharge back with her [place] in [place].  Length of Stay: 2  Current Medications: Scheduled Meds:  . atorvastatin  20 mg Oral q1800  . Chlorhexidine Gluconate Cloth  6 each Topical Daily  . hydrocortisone sod succinate (SOLU-CORTEF) inj  50 mg Intravenous Q12H   Followed by  . [START ON 03/18/2020] hydrocortisone sod succinate (SOLU-CORTEF) inj  50 mg Intravenous Daily  . insulin aspart  0-9 Units Subcutaneous TID WC  . levETIRAcetam  750 mg Oral BID  . levothyroxine  125 mcg Oral QAC breakfast   . pantoprazole  40 mg Oral Daily  . polyethylene glycol  17 g Oral BID  . potassium chloride  20 mEq Oral BID  . warfarin  4 mg Oral ONCE-1600  . Warfarin - Pharmacist Dosing Inpatient   Does not apply q1600    Continuous Infusions: . sodium chloride 250 mL (03/16/20 2024)    PRN Meds: sodium chloride, docusate sodium  Physical Exam Constitutional:      General: She is awake. She is not in acute distress.    Appearance: She is obese.  Pulmonary:     Effort: No respiratory distress.  Skin:    General: Skin is warm and dry.  Neurological:     Mental Status: She is alert.     Motor: Weakness present.  Psychiatric:        Mood and Affect: Affect is tearful.        Behavior: Behavior is cooperative.        Cognition and Memory: Cognition and memory normal.             Vital Signs: BP (!) 141/92 (BP Location: Left Arm)   Pulse (!) 59   Temp 98.3 F (36.8 C) (Oral)   Resp (!) 22   Ht 5' (1.524 m)   Wt 71.3 kg   SpO2 97%   BMI 30.70 kg/m  SpO2: SpO2: 97 % O2 Device: O2 Device: Room Air O2 Flow Rate:    Intake/output summary:   Intake/Output Summary (Last 24 hours) at 03/17/2020 1521 Last data filed at 03/17/2020 1411 Gross per 24 hour  Intake 1924.46 ml  Output 1800 ml  Net 124.46 ml   LBM: Last BM Date: 03/16/20 Baseline Weight: Weight: 66 kg Most recent weight: Weight: 71.3 kg       Palliative Assessment/Data: PPS 50%    Flowsheet Rows     Most Recent Value  Intake Tab  Referral Department Critical care  Unit at Time of Referral ER  Palliative Care Primary Diagnosis Nephrology  Date Notified 03/15/20  Palliative Care Type New Palliative care  Reason for referral Clarify Goals of Care  Date of Admission 03/14/20  Date first seen by Palliative Care 03/15/20  # of days Palliative referral response time 0 Day(s)  #  of days IP prior to Palliative referral 1  Clinical Assessment  Psychosocial & Spiritual Assessment  Palliative Care Outcomes   Patient/Family meeting held? Yes  Who was at the meeting? patient  Palliative Care Outcomes ACP counseling assistance, Completed durable DNR, Changed CPR status, Provided psychosocial or spiritual support, Provided advance care planning, Clarified goals of care  Patient/Family wishes: Interventions discontinued/not started  Mechanical Ventilation      Patient Active Problem List   Diagnosis Date Noted  . Encounter for hospice care discussion   . DNR (do not resuscitate)   . DNI (do not intubate)   . Renal failure 03/15/2020  . Elevated lactic acid level   . Hyperkalemia   . Hypoglycemia   . Palliative care by specialist   . Goals of care, counseling/discussion   . Advanced directives, counseling/discussion   . Generalized weakness   . Unspecified atrial fibrillation (HCC) 02/04/2020  . History of cardioembolic cerebrovascular accident (CVA) 02/04/2020  . Chronic systolic CHF (congestive heart failure) (HCC) 02/04/2020  . CAD (coronary artery disease) 02/04/2020  . Essential hypertension 02/04/2020  . Dyslipidemia 02/04/2020  . Hypothyroidism 02/04/2020  . Seizure (HCC) 02/03/2020    Palliative Care Assessment & Plan   Patient Profile: 64 y.o.femalewith past medical history of seizures, atrial fibrillation, hypertension, diabetes mellitus, CHF (EF 30-40%), CVA on warfarinadmitted on 9/2/2021with acute renal failure, metabolic acidosis, acute metabolic encephalopathy, hypoglycemia.  Assessment: Acute kidney injury UTI History of seizure CHF Metabolic acidosis Acute metabolic encephalopathy Atrial fibrillation Diabetes Mellitus Generalized weakness  Recommendations/Plan: Continue to medically optimize with full scope treatment  Continue DNR/DNI as previously documented Patient expressed today she wants to discharge with home hospice - her goal is to not be rehospitalized or have any life prolonging interventions after discharge Son and daughter seem to need  continued education on patient's chronic illnesses in context of her multiple hospitalizations Patient will now discharge back to live with her daughter in [place] per TOC  TOC have been notified of GOC change MOST form completed as follows: DNR, Comfort measures, No antibiotics, No IV fluids, No feeding tube Original copy of MOST form placed on shadow chart, copy made and will be scanned into [employer] PMT will continue to follow holistically  Goals of Care and Additional Recommendations: Limitations on Scope of Treatment: Full Scope Treatment  Code Status:    Code Status Orders  (From admission, onward)         Start     Ordered   03/15/20 1303  Do not attempt resuscitation (DNR)  Continuous       Question Answer Comment  In the event of cardiac or respiratory ARREST Do not call a "code blue"   In the event of cardiac or respiratory ARREST Do not perform Intubation, CPR, defibrillation or ACLS   In the event of cardiac or respiratory ARREST Use medication by any route, position, wound care, and other measures to relive pain and suffering. May use oxygen, suction and manual treatment of airway obstruction as needed for comfort.      03/15/20 1302        Code Status History    Date Active Date Inactive Code Status Order ID Comments User Context   03/15/2020 0142 03/15/2020 1302 Full Code [id number]  Gleason, Markus Jarvis ED   02/03/2020 2232 02/08/2020 0016 Full Code [id number]  Mansy, Vernetta Honey, MD ED   Advance Care Planning Activity      Prognosis:  < 6 months  Discharge Planning:  Home with Hospice  Care plan was discussed with primary RN, TOC, Internal Medicine Team, patient, son, daughter  Thank you for allowing the Palliative Medicine Team to assist in the care of this patient.   Total Time 65 minutes Prolonged Time Billed  yes       Greater than 50%  of this time was spent counseling and coordinating care related to the above assessment and plan.  Haskel Khan,  NP  Please contact Palliative Medicine Team phone at [phone] for questions and concerns.

## 2020-03-17 NOTE — Evaluation (Signed)
Physical Therapy Evaluation Patient Details Name: Nichole [name] MRN: [id number] DOB: September 13, 1955 Today's Date: 03/17/2020   History of Present Illness  Patient is a 64 y/o female who presents with AMS. Found to have hypoglycemia with possible absence seizure, ARF, uremia, hypotension and acute metabolic encephalopathy. PMH includes asthma, chronic paraoxysmal A-fib, systolic HR, CAD, HTN, CVA, dyslipidemia.  Clinical Impression  Patient presents with generalized weakness, impaired balance and impaired mobility s/p above. Pt lives at home with daughter and is independent for ADLs/ambulation PTA. Today, pt requires Min guard assist for bed mobility and Min A for transfers/gait training with use of RW for support. Noted to have balance impairments with dynamic tasks. Has to negotiate 10 stairs to get into apt. Will plan for stair training next session as tolerated. Will follow acutely to maximize independence and mobility prior to return home.     Follow Up Recommendations Home health PT;Supervision for mobility/OOB    Equipment Recommendations  None recommended by PT    Recommendations for Other Services       Precautions / Restrictions Precautions Precautions: Fall Restrictions Weight Bearing Restrictions: No      Mobility  Bed Mobility Overal bed mobility: Needs Assistance Bed Mobility: Supine to Sit     Supine to sit: Min guard;HOB elevated     General bed mobility comments: Use of rail but no assist to get to EOB.  Transfers Overall transfer level: Needs assistance Equipment used: Rolling walker (2 wheeled) Transfers: Sit to/from Stand Sit to Stand: Min assist         General transfer comment: Assist to power to standing with cues for hand placement; slow to rise. Transferred to chair post ambulation.  Ambulation/Gait Ambulation/Gait assistance: Min assist Gait Distance (Feet): 30 Feet Assistive device: Rolling walker (2 wheeled) Gait Pattern/deviations:  Step-through pattern;Decreased stride length;Narrow base of support Gait velocity: decreased Gait velocity interpretation: <1.31 ft/sec, indicative of household ambulator General Gait Details: Slow, unsteady gait with cues for RW management and balance; when leaning forward to look at something outside window, almost LOB needing support. BP elevated 155/103.  Stairs            Wheelchair Mobility    Modified Rankin (Stroke Patients Only)       Balance Overall balance assessment: Needs assistance Sitting-balance support: Feet supported;No upper extremity supported Sitting balance-Leahy Scale: Good     Standing balance support: During functional activity Standing balance-Leahy Scale: Poor Standing balance comment: Requires Ue support in standing.                             Pertinent Vitals/Pain Pain Assessment: No/denies pain    Home Living Family/patient expects to be discharged to:: Private residence Living Arrangements: Children (daughter at this time) Available Help at Discharge: Family;Available PRN/intermittently Type of Home: Apartment Home Access: Stairs to enter Entrance Stairs-Rails: Right Entrance Stairs-Number of Steps: 10 Home Layout: One level Home Equipment: Walker - 2 wheels;Cane - single point      Prior Function Level of Independence: Independent   Gait / Transfers Assistance Needed: Independent with ambulation.  ADL's / Homemaking Assistance Needed: needs assistance with cooking and cleaning, reports independence with bathing and dressing  Comments: Might be moving in with son in [place].     Hand Dominance   Dominant Hand: Right    Extremity/Trunk Assessment   Upper Extremity Assessment Upper Extremity Assessment: Defer to OT evaluation    Lower Extremity Assessment Lower  Extremity Assessment: Generalized weakness (but functional)       Communication   Communication: No difficulties  Cognition Arousal/Alertness:  Awake/alert Behavior During Therapy: WFL for tasks assessed/performed Overall Cognitive Status: No family/caregiver present to determine baseline cognitive functioning                                 General Comments: Pt confused about timeline and when she was admitted; "when did I get here?" Thinks it is August 2021. Slow tor espond at times.      General Comments      Exercises     Assessment/Plan    PT Assessment Patient needs continued PT services  PT Problem List Decreased strength;Decreased mobility;Decreased balance;Decreased knowledge of use of DME;Decreased cognition;Decreased activity tolerance       PT Treatment Interventions Therapeutic activities;DME instruction;Therapeutic exercise;Gait training;Patient/family education;Balance training;Functional mobility training;Stair training    PT Goals (Current goals can be found in the Care Plan section)  Acute Rehab PT Goals Patient Stated Goal: to stay living in [place] with daughter PT Goal Formulation: With patient Time For Goal Achievement: 03/31/20 Potential to Achieve Goals: Good    Frequency Min 3X/week   Barriers to discharge Decreased caregiver support      Co-evaluation               AM-PAC PT "6 Clicks" Mobility  Outcome Measure Help needed turning from your back to your side while in a flat bed without using bedrails?: A Little Help needed moving from lying on your back to sitting on the side of a flat bed without using bedrails?: A Little Help needed moving to and from a bed to a chair (including a wheelchair)?: A Little Help needed standing up from a chair using your arms (e.g., wheelchair or bedside chair)?: A Little Help needed to walk in hospital room?: A Little Help needed climbing 3-5 steps with a railing? : A Lot 6 Click Score: 17    End of Session Equipment Utilized During Treatment: Gait belt Activity Tolerance: Patient tolerated treatment well Patient left: in chair;with  call bell/phone within reach;with chair alarm set;with nursing/sitter in room Nurse Communication: Mobility status PT Visit Diagnosis: Muscle weakness (generalized) (M62.81);Difficulty in walking, not elsewhere classified (R26.2);Unsteadiness on feet (R26.81)    Time: 7035-0093 PT Time Calculation (min) (ACUTE ONLY): 21 min   Charges:   PT Evaluation $PT Eval Moderate Complexity: 1 Mod          Vale Haven, PT, DPT Acute Rehabilitation Services Pager [phone] Office [phone]      Blake Divine A Lanier Ensign 03/17/2020, 1:54 PM

## 2020-03-17 NOTE — TOC Initial Note (Signed)
Transition of Care [facility]) - Initial/Assessment Note    Patient Details  Name: Nichole [name] MRN: [id number] Date of Birth: 15-Mar-1956  Transition of Care [facility]) CM/SW Contact:    Geralynn Ochs, LCSW Phone Number: 03/17/2020, 4:55 PM  Clinical Narrative:   CSW spoke with patient's children, Randall Hiss and Mateo Flow, via conference call to discuss discharge plans. Children had just gotten off the phone with Amber with PMT that the patient wanted to stay in [place], but were unclear about the plan. Mateo Flow asked about what happened, as they thought they were bringing the patient in for seizures and now they're being told that she's dying, and CSW indicated that the MD could talk to them.  CSW discussed with Amber about the call that she had with the children, and that she had met with the patient who wanted to initiate full comfort care services and did not want to continue treating her chronic illnesses. Per discussion with palliative, patient wants to stay in [place] and initiate hospice. CSW explained to [employer] that the children still seemed to be confused about the plan, and Amber indicated she would pass the message along to MD. CSW sent referral to [facility] for hospice services.        Expected Discharge Plan: Home w Hospice Care Barriers to Discharge: Continued Medical Work up   Patient Goals and CMS Choice        Expected Discharge Plan and Services Expected Discharge Plan: [place] Acute Care Choice: Hospice Living arrangements for the past 2 months: Single Family Home                                      Prior Living Arrangements/Services Living arrangements for the past 2 months: Single Family Home Lives with:: Adult Children Patient language and need for interpreter reviewed:: No Do you feel safe going back to the place where you live?: Yes      Need for Family Participation in Patient Care: Yes (Comment) Care giver support system in  place?: Yes (comment) Current home services: DME Criminal Activity/Legal Involvement Pertinent to Current Situation/Hospitalization: No - Comment as needed  Activities of Daily Living      Permission Sought/Granted Permission sought to share information with : Facility Sport and exercise psychologist, Family Supports Permission granted to share information with : Yes, Verbal Permission Granted  Share Information with NAME: Seymour Bars  Permission granted to share info w AGENCY: Hospice  Permission granted to share info w Relationship: Children     Emotional Assessment       Orientation: : Oriented to Self, Oriented to Place, Oriented to  Time, Oriented to Situation Alcohol / Substance Use: Not Applicable Psych Involvement: No (comment)  Admission diagnosis:  Hyperkalemia [E87.5] Renal failure [N19] Hypoglycemia [E16.2] Elevated lactic acid level [R79.89] Acute renal failure, unspecified acute renal failure type [facility]) [N17.9] Patient Active Problem List   Diagnosis Date Noted  . Encounter for hospice care discussion   . DNR (do not resuscitate)   . DNI (do not intubate)   . Renal failure 03/15/2020  . Elevated lactic acid level   . Hyperkalemia   . Hypoglycemia   . Palliative care by specialist   . Goals of care, counseling/discussion   . Advanced directives, counseling/discussion   . Generalized weakness   . Unspecified atrial fibrillation ([place]) 02/04/2020  . History of cardioembolic cerebrovascular accident (  CVA) 02/04/2020  . Chronic systolic CHF (congestive heart failure) ([facility]) 02/04/2020  . CAD (coronary artery disease) 02/04/2020  . Essential hypertension 02/04/2020  . Dyslipidemia 02/04/2020  . Hypothyroidism 02/04/2020  . Seizure ([place]) 02/03/2020   PCP:  Patient, No Pcp Per Pharmacy:   [facility], [address] [place] [address] Phone: [phone] Fax: [phone]     Social Determinants of  Health (SDOH) Interventions    Readmission Risk Interventions Readmission Risk Prevention Plan 02/06/2020  Post Dischage Appt Complete  Medication Screening Complete  Transportation Screening Complete

## 2020-03-17 NOTE — Progress Notes (Signed)
Civil engineer, contracting [facility]) Hospice and Palliative Care  Received call from [facility] manager for likely hospice at home once pt is ready to discharge.    Chart reviewed, noted sudden decline since the passing of her husband, [name] supports this too. Nichole [name] wants to support her mothers wishes and bring her home with hospice support.  There is a son in [place] that is not necessarily in agreement with this plan.  DME discussed, no needs currently.  She has a walker and shower chair at her daughters house.   D/c date is unclear.  Nichole [name] states is may be Tuesday.  ACC will follow up with family to make sure hospice is still the plan over the next few days.  Wallis Bamberg RN, BSN, CCRN [facility] Liaison

## 2020-03-17 NOTE — Care Management (Signed)
Discussed d/c plan with patient's son and daughter on a conference call.  The plan is for the patient to go live with son, Minerva Areola, near [place], [place] in about 1 month.  She will continue to stay with her daughter, Vikki Ports, at the [employer] address until they are ready to move her.  The patient states she will transport to [place] in the car.  The son is moving to a new residence that is more conducive to his mother's needs at [address] [place], [place], [address].  The patient moved from [place] to her daughter's home in [place] around 6 weeks ago.  The daughter has established care with cardiology and neurology, but has not established a local PCP.  She is given the [facility] number to call. She does have a PCP in [place], but unknown when she has last been in contact with him.   They desire for their mother to have maximum [facility] care and possibly personal care services in [place].  They are agreeable to OP palliative care.  Discussed with Wallis Bamberg [facility]), who has been in contact with daughter.  ACC will continue to follow and can refer patient to a provider in [place] when she moves. Apparently, there was some discussion with PM about hospice care upon d/c.  The patient has a walker, cane, and 3n1 at home. She declines a WC.  TOC will continue to follow as family seems undecided at this time.

## 2020-03-17 NOTE — Progress Notes (Signed)
  Speech Language Pathology Treatment: Dysphagia  Patient Details Name: Nichole [name] MRN: [id number] DOB: 31-Dec-1955 Today's Date: 03/17/2020 Time: 2072-1828 SLP Time Calculation (min) (ACUTE ONLY): 19 min  Assessment / Plan / Recommendation Clinical Impression  SLP was contacted by family medicine MD who indicated that the pt will be transitioning to hospice care and would like her diet to be liberalized from full liquids if possible. SLP saw the pt for dysphagia treatment and she expressed that she would like to have more p.o. options. She tolerated thin liquids without overt s/sx of aspiration. Mastication was prolonged with pineapple and pt exhibited an instance of coughing. However, mastication was [employer] [facility] [employer] with other regular textures such as graham crackers and no s/sx of aspiration were noted. Pt's performance  and options regarding her diet were discussed with the pt since some regular textures do appear to be more difficult. Considering goals of care, it was agreed that her diet be advanced to regular texture solids with thin liquids to allow her the maximum number of options. Aspiration precautions were discussed to reduce the annoyance/discomfort of coughing with p.o. intake and pt verbalized understanding regarding them. Further skilled SLP services are not clinically indicated at this time.    HPI HPI: 64 year old female with past medical history significant for CHF, diabetes, hypertension, chronic heart failure A. fib on warfarin, seizures, CVA who presented with altered mental status. Dx acute renal failure, metabolic acidosis, FTT.  Has had recurrent ED visits; her husband passed away in 02/13/2023. Palliative care consulted and pt has decided on hospice care.     SLP Plan  All goals met;Discharge SLP treatment due to (comment)       Recommendations  Diet recommendations: Regular;Thin liquid Liquids provided via: Straw;Cup Medication Administration: Crushed with puree Supervision: Patient  able to self feed Compensations: Slow rate;Small sips/bites Postural Changes and/or Swallow Maneuvers: Seated upright 90 degrees                Oral Care Recommendations: Oral care BID Follow up Recommendations: Other (comment) (tba) SLP Visit Diagnosis: Dysphagia, oral phase (R13.11) Plan: All goals met;Discharge SLP treatment due to (comment)       Karem Tomaso I. Hardin Negus, [place], [place] Office number [phone] Pager [phone]                Horton Marshall 03/17/2020, 4:23 PM

## 2020-03-17 NOTE — Progress Notes (Signed)
[facility] ROUNDING NOTE   Subjective:   Brief history: History of congestive heart failure systolic/diastolic dysfunction on 2D echo July 2021 with an ejection fraction of 35 to 45%., diabetes hypertension atrial fibrillation on Coumadin and seizure disorders brought in with altered mental status.  She has a baseline serum creatinine about 1.3 to 1.6 mg/dL.  And in the emergency room was found to have a creatinine of 6.7 mg/dL.  She underwent CT scan evaluation of her abdomen that did not reveal any evidence of hydronephrosis.  She underwent Foley catheter placement.  As an outpatient she was taking both ramipril 5 mg daily and Metformin 1 g twice daily.  Blood pressure 131/75 pulse 85 temperature 98.4 O2 sats 94% room air   urine output 1.6 L 03/16/2020  Sodium 142 potassium 3.3 chloride 108 CO2 27 BUN 24 creatinine 1.08 glucose 158 calcium 9.1 albumin 3.2 hemoglobin 10.5 INR 2.5  Keflex 500 mg twice daily atorvastatin 20 mg daily, hydrocortisone 50 mg every 12 hours, Keppra 750 mg twice daily levothyroxine to 125 mcg daily, Protonix 40 mg daily  IV sodium bicarbonate 100 cc an hour    Objective:  Vital signs in last 24 hours:  Temp:  [98.4 F (36.9 C)-98.7 F (37.1 C)] 98.4 F (36.9 C) (09/05 0400) Pulse Rate:  [108] 108 (09/05 0050) Resp:  [20] 20 (09/05 0400) BP: (127-153)/(72-88) 136/72 (09/05 0400) SpO2:  [93 %-96 %] 93 % (09/05 0400) Weight:  [71.3 kg] 71.3 kg (09/05 0500)  Weight change: -1.3 kg Filed Weights   03/16/20 0405 03/17/20 0050 03/17/20 0500  Weight: 72.6 kg 71.3 kg 71.3 kg    Intake/Output: I/O last 3 completed shifts: In: 3403.4 [P.O.:720; I.V.:2683.4] Out: 2300 [Urine:2300]   Intake/Output this shift:  No intake/output data recorded.  General: Chronically ill-appearing no obvious distress HEENT: Normocephalic atraumatic mucous membranes moist Eyes: Pupils round equal reactive no icterus or pallor Neck: Supple no thyromegaly no  adenopathy Heart: Regular rate and rhythm no murmurs rubs or gallops Lungs: Tachypneic no wheezes or rales Abdomen: Soft nontender no organosplenomegaly Extremities: Trace edema no cyanosis clubbing Skin: No skin rashes or lesions Neuro: No focal deficits   Basic Metabolic Panel: Recent Labs  Lab 03/14/20 1745 03/14/20 1745 03/14/20 2134 03/15/20 0322 03/16/20 0944 03/17/20 0537  NA 134*  --  136 132* 139 142  K 5.4*  --  5.5* 4.2 3.8 3.3*  CL 104  --   --  105 107 108  CO2 14*  --   --  14* 20* 27  GLUCOSE 108*  --   --  218* 258* 158*  BUN 88*  --   --  73* 39* 24*  CREATININE 6.71*  --   --  4.90* 1.67* 1.08*  CALCIUM 9.7   < >  --  8.3* 8.7* 9.1  MG  --   --   --  1.3* 1.3* 1.7  PHOS  --   --   --  4.6 3.0 2.1*   < > = values in this interval not displayed.    Liver Function Tests: Recent Labs  Lab 03/14/20 1745 03/16/20 0944 03/17/20 0537  AST 18  --   --   ALT 21  --   --   ALKPHOS 41  --   --   BILITOT 0.5  --   --   PROT 7.8  --   --   ALBUMIN 4.1 3.2* 3.2*   No results for input(s): LIPASE, AMYLASE  in the last 168 hours. No results for input(s): AMMONIA in the last 168 hours.  CBC: Recent Labs  Lab 03/14/20 1745 03/14/20 2134 03/15/20 0322 03/16/20 0944 03/17/20 0537  WBC 10.3  --  10.1 7.9 10.4  NEUTROABS 8.1*  --   --   --   --   HGB 12.3 13.6 11.6* 10.4* 10.5*  HCT 40.0 40.0 37.4 30.8* 31.3*  MCV 92.2  --  92.1 86.5 86.7  PLT 270  --  257 228 220    Cardiac Enzymes: No results for input(s): CKTOTAL, CKMB, CKMBINDEX, TROPONINI in the last 168 hours.  BNP: Invalid input(s): POCBNP  CBG: Recent Labs  Lab 03/16/20 1641 03/16/20 2017 03/17/20 0021 03/17/20 0420 03/17/20 0754  GLUCAP 148* 232* 218* 162* 260*    Microbiology: Results for orders placed or performed during the hospital encounter of 03/14/20  Blood culture (routine x 2)     Status: None (Preliminary result)   Collection Time: 03/14/20  6:25 PM   Specimen: BLOOD   Result Value Ref Range Status   Specimen Description BLOOD LEFT ANTECUBITAL  Final   Special Requests   Final    BOTTLES DRAWN AEROBIC AND ANAEROBIC Blood Culture adequate volume   Culture   Final    NO GROWTH 3 DAYS Performed at [facility], [address] [place], [place] [address]    Report Status PENDING  Incomplete  SARS Coronavirus 2 by RT PCR (hospital order, performed in [facility] hospital lab) Nasopharyngeal Nasopharyngeal Swab     Status: None   Collection Time: 03/14/20  7:37 PM   Specimen: Nasopharyngeal Swab  Result Value Ref Range Status   SARS Coronavirus 2 NEGATIVE NEGATIVE Final    Comment: (NOTE) SARS-CoV-2 target nucleic acids are NOT DETECTED.  The SARS-CoV-2 RNA is generally detectable in upper and lower respiratory specimens during the acute phase of infection. The lowest concentration of SARS-CoV-2 viral copies this assay can detect is 250 copies / mL. A negative result does not preclude SARS-CoV-2 infection and should not be used as the sole basis for treatment or other patient management decisions.  A negative result may occur with improper specimen collection / handling, submission of specimen other than nasopharyngeal swab, presence of viral mutation(s) within the areas targeted by this assay, and inadequate number of viral copies (<250 copies / mL). A negative result must be combined with clinical observations, patient history, and epidemiological information.  Fact Sheet for Patients:   BoilerBrush.com.cy  Fact Sheet for Healthcare Providers: https://pope.com/  This test is not yet approved or  cleared by the [place] FDA and has been authorized for detection and/or diagnosis of SARS-CoV-2 by FDA under an Emergency Use Authorization (EUA).  This EUA will remain in effect (meaning this test can be used) for the duration of the COVID-19 declaration under Section 564(b)(1) of the Act, 21  U.S.C. section 360bbb-3(b)(1), unless the authorization is terminated or revoked sooner.  Performed at [facility], [address] [place], [place] [address]   Blood culture (routine x 2)     Status: None (Preliminary result)   Collection Time: 03/14/20  8:42 PM   Specimen: BLOOD  Result Value Ref Range Status   Specimen Description BLOOD SITE NOT SPECIFIED  Final   Special Requests   Final    BOTTLES DRAWN AEROBIC AND ANAEROBIC Blood Culture adequate volume   Culture   Final    NO GROWTH 3 DAYS Performed at [facility], [address] [place], [place] [address]    Report Status PENDING  Incomplete  Urine culture     Status: None   Collection Time: 03/15/20 11:12 AM   Specimen: Urine, Random  Result Value Ref Range Status   Specimen Description URINE, RANDOM  Final   Special Requests NONE  Final   Culture   Final    NO GROWTH Performed at [facility], [address] [place], [place] [address]    Report Status 03/16/2020 FINAL  Final    Coagulation Studies: Recent Labs    03/14/20 1745 03/16/20 0944 03/17/20 0537  LABPROT 18.5* 27.4* 26.5*  INR 1.6* 2.7* 2.5*    Urinalysis: Recent Labs    03/14/20 2124 03/15/20 0933  COLORURINE YELLOW STRAW*  LABSPEC 1.016 1.008  PHURINE 5.0 5.0  GLUCOSEU NEGATIVE NEGATIVE  HGBUR NEGATIVE SMALL*  BILIRUBINUR NEGATIVE NEGATIVE  KETONESUR NEGATIVE NEGATIVE  PROTEINUR 30* NEGATIVE  NITRITE NEGATIVE NEGATIVE  LEUKOCYTESUR NEGATIVE TRACE*      Imaging: ECHOCARDIOGRAM COMPLETE  Result Date: 03/15/2020    ECHOCARDIOGRAM REPORT   Patient Name:   Karenann CaiNGELA Eidson Date of Exam: 03/15/2020 Medical Rec #:  [id number]     Height:       60.0 in Accession #:    [id number]    Weight:       145.5 lb Date of Birth:  09/16/1955      BSA:          1.631 m Patient Age:    64 years      BP:           124/88 mmHg Patient Gender: F             HR:           60 bpm. Exam Location:  Inpatient Procedure: 2D Echo and Intracardiac  Opacification Agent Indications:    Abnormal ECG 794.31 / R94.31  History:        Patient has prior history of Echocardiogram examinations, most                 recent 02/05/2020. CHF, Stroke, Arrythmias:Atrial Fibrillation,                 Signs/Symptoms:Altered mental status; Risk Factors:Hypertension                 and Diabetes.  Sonographer:    Leta Junglingiffany Cooper RDCS Referring Phys: [phone] LAURA R GLEASON IMPRESSIONS  1. Left ventricular ejection fraction, by estimation, is 55 to 60%. The left ventricle has normal function. The left ventricle has no regional wall motion abnormalities. Left ventricular diastolic parameters are consistent with Grade I diastolic dysfunction (impaired relaxation).  2. Right ventricular systolic function is normal. The right ventricular size is normal. There is normal pulmonary artery systolic pressure.  3. The mitral valve is normal in structure. Trivial mitral valve regurgitation. No evidence of mitral stenosis.  4. The aortic valve is normal in structure. Aortic valve regurgitation is not visualized. No aortic stenosis is present.  5. The inferior vena cava is normal in size with greater than 50% respiratory variability, suggesting right atrial pressure of 3 mmHg. Comparison(s): Prior images reviewed side by side. Left ventricular systolic function has improved (but EF was probably underestimated on previous report). FINDINGS  Left Ventricle: Left ventricular ejection fraction, by estimation, is 55 to 60%. The left ventricle has normal function. The left ventricle has no regional wall motion abnormalities. Definity contrast agent  was given IV to delineate the left ventricular  endocardial borders. The left ventricular internal cavity size was normal in size. There is no left ventricular hypertrophy. Left ventricular diastolic parameters are consistent with Grade I diastolic dysfunction (impaired relaxation). Indeterminate filling pressures. Right Ventricle: The right ventricular  size is normal. No increase in right ventricular wall thickness. Right ventricular systolic function is normal. There is normal pulmonary artery systolic pressure. The tricuspid regurgitant velocity is 1.66 m/s, and  with an assumed right atrial pressure of 8 mmHg, the estimated right ventricular systolic pressure is 19.0 mmHg. Left Atrium: Left atrial size was normal in size. Right Atrium: Right atrial size was normal in size. Pericardium: There is no evidence of pericardial effusion. Mitral Valve: The mitral valve is normal in structure. Normal mobility of the mitral valve leaflets. Trivial mitral valve regurgitation. No evidence of mitral valve stenosis. Tricuspid Valve: The tricuspid valve is normal in structure. Tricuspid valve regurgitation is mild . No evidence of tricuspid stenosis. Aortic Valve: The aortic valve is normal in structure. Aortic valve regurgitation is not visualized. No aortic stenosis is present. Pulmonic Valve: The pulmonic valve was normal in structure. Pulmonic valve regurgitation is not visualized. No evidence of pulmonic stenosis. Aorta: The aortic root is normal in size and structure. Venous: The inferior vena cava is normal in size with greater than 50% respiratory variability, suggesting right atrial pressure of 3 mmHg. IAS/Shunts: No atrial level shunt detected by color flow Doppler. Additional Comments: A pacer wire is visualized.  LEFT VENTRICLE PLAX 2D LVIDd:         5.30 cm      Diastology LVIDs:         4.00 cm      LV e' lateral:   2.51 cm/s LV PW:         0.90 cm      LV E/e' lateral: 15.3 LV IVS:        0.90 cm      LV e' medial:    3.32 cm/s LVOT diam:     2.00 cm      LV E/e' medial:  11.6 LV SV:         46 LV SV Index:   28 LVOT Area:     3.14 cm  LV Volumes (MOD) LV vol d, MOD A2C: 152.5 ml LV vol d, MOD A4C: 101.0 ml LV vol s, MOD A2C: 52.3 ml LV vol s, MOD A4C: 49.9 ml LV SV MOD A2C:     100.2 ml LV SV MOD A4C:     101.0 ml LV SV MOD BP:      81.0 ml RIGHT VENTRICLE RV  S prime:     9.73 cm/s TAPSE (M-mode): 0.8 cm LEFT ATRIUM             Index LA diam:        2.80 cm 1.72 cm/m LA Vol (A2C):   64.4 ml 39.50 ml/m LA Vol (A4C):   51.1 ml 31.34 ml/m LA Biplane Vol: 57.7 ml 35.39 ml/m  AORTIC VALVE LVOT Vmax:   79.50 cm/s LVOT Vmean:  58.300 cm/s LVOT VTI:    0.145 m  AORTA Ao Root diam: 3.20 cm MITRAL VALVE               TRICUSPID VALVE MV Area (PHT): 2.48 cm    TR Peak grad:   11.0 mmHg MV Decel Time: 306 msec    TR Vmax:  166.00 cm/s MV E velocity: 38.40 cm/s MV A velocity: 51.00 cm/s  SHUNTS MV E/A ratio:  0.75        Systemic VTI:  0.14 m                            Systemic Diam: 2.00 cm Mihai Croitoru MD Electronically signed by Thurmon Fair MD Signature Date/Time: 03/15/2020/1:55:40 PM    Final      Medications:    sodium chloride 250 mL (03/16/20 2024)    sodium bicarbonate (isotonic) infusion in sterile water 50 mL/hr at 03/17/20 0603    atorvastatin  20 mg Oral q1800   Chlorhexidine Gluconate Cloth  6 each Topical Daily   hydrocortisone sod succinate (SOLU-CORTEF) inj  50 mg Intravenous Q12H   Followed by   Melene Muller ON 03/18/2020] hydrocortisone sod succinate (SOLU-CORTEF) inj  50 mg Intravenous Daily   insulin aspart  0-9 Units Subcutaneous TID WC   levETIRAcetam  750 mg Oral BID   levothyroxine  125 mcg Oral QAC breakfast   pantoprazole  40 mg Oral Daily   Warfarin - Pharmacist Dosing Inpatient   Does not apply q1600   sodium chloride, docusate sodium, polyethylene glycol  Assessment/ Plan:  1.Acute kidney injury.  Patient with altered mental status volume resuscitated.  Discontinued ACE inhibitor no use nonsteroidal anti-inflammatory drugs as far as we can tell.  No evidence of hydronephrosis.    Urinalysis bland  Baseline serum creatinine appears to be about 1.3 to 1.5 mg/dL.  Patient's creatinine is returned to baseline 2. Hypertension/volume  -stable at this time.  Volume resuscitation. 3.  Metabolic acidosis will discontinue  sodium bicarbonate infusion 4.  Congestive heart failure with diastolic and systolic dysfunction we will need to closely follow especially in the setting of volume resuscitation 5.  History of seizure disorders continues on Keppra 6.  Urinary tract infection CT evidence of cystitis antibiotics per primary service. 7.  Hypokalemia replete 20 mEq oral twice daily x1 day.  Will sign off patient no nephrology follow-up needed.  Avoid ACE inhibitors ARB use nonsteroidal anti-inflammatory drugs on this lady.    LOS: 2 Nichole [name] @TODAY @8 :04 AM

## 2020-03-18 DIAGNOSIS — I482 Chronic atrial fibrillation, unspecified: Secondary | ICD-10-CM

## 2020-03-18 LAB — GLUCOSE, CAPILLARY
Glucose-Capillary: 225 mg/dL — ABNORMAL HIGH (ref 70–99)
Glucose-Capillary: 201 mg/dL — ABNORMAL HIGH (ref 70–99)
Glucose-Capillary: 271 mg/dL — ABNORMAL HIGH (ref 70–99)
Glucose-Capillary: 299 mg/dL — ABNORMAL HIGH (ref 70–99)
Glucose-Capillary: 423 mg/dL — ABNORMAL HIGH (ref 70–99)
Glucose-Capillary: 449 mg/dL — ABNORMAL HIGH (ref 70–99)
Glucose-Capillary: 301 mg/dL — ABNORMAL HIGH (ref 70–99)

## 2020-03-18 LAB — CBC
HCT: 32.4 % — ABNORMAL LOW (ref 36.0–46.0)
Hemoglobin: 10.5 g/dL — ABNORMAL LOW (ref 12.0–15.0)
MCH: 28.2 pg (ref 26.0–34.0)
MCHC: 32.4 g/dL (ref 30.0–36.0)
MCV: 86.9 fL (ref 80.0–100.0)
Platelets: 218 10*3/uL (ref 150–400)
RBC: 3.73 MIL/uL — ABNORMAL LOW (ref 3.87–5.11)
RDW: 12.6 % (ref 11.5–15.5)
WBC: 11.7 10*3/uL — ABNORMAL HIGH (ref 4.0–10.5)
nRBC: 0.5 % — ABNORMAL HIGH (ref 0.0–0.2)

## 2020-03-18 LAB — GLUCOSE, RANDOM: Glucose, Bld: 444 mg/dL — ABNORMAL HIGH (ref 70–99)

## 2020-03-18 LAB — RENAL FUNCTION PANEL
Albumin: 3 g/dL — ABNORMAL LOW (ref 3.5–5.0)
Anion gap: 11 (ref 5–15)
BUN: 18 mg/dL (ref 8–23)
CO2: 25 mmol/L (ref 22–32)
Calcium: 9.1 mg/dL (ref 8.9–10.3)
Chloride: 103 mmol/L (ref 98–111)
Creatinine, Ser: 1.41 mg/dL — ABNORMAL HIGH (ref 0.44–1.00)
GFR calc Af Amer: 46 mL/min — ABNORMAL LOW (ref >60)
GFR calc non Af Amer: 39 mL/min — ABNORMAL LOW (ref >60)
Glucose, Bld: 295 mg/dL — ABNORMAL HIGH (ref 70–99)
Phosphorus: 2.1 mg/dL — ABNORMAL LOW (ref 2.5–4.6)
Potassium: 3.7 mmol/L (ref 3.5–5.1)
Sodium: 139 mmol/L (ref 135–145)

## 2020-03-18 LAB — PROTIME-INR
INR: 3.5 — ABNORMAL HIGH (ref 0.8–1.2)
Prothrombin Time: 34 seconds — ABNORMAL HIGH (ref 11.4–15.2)

## 2020-03-18 MED ORDER — PHENOL 1.4 % MT LIQD
1.0000 | OROMUCOSAL | Status: DC | PRN
  Administered 2020-03-18: 1 via OROMUCOSAL
  Filled 2020-03-18: qty 177

## 2020-03-18 MED ORDER — CARVEDILOL 12.5 MG PO TABS
12.5000 mg | ORAL_TABLET | Freq: Two times a day (BID) | ORAL | Status: DC
  Administered 2020-03-18 – 2020-03-19 (×2): 12.5 mg via ORAL
  Filled 2020-03-18 (×2): qty 1

## 2020-03-18 MED ORDER — INSULIN GLARGINE 100 UNIT/ML ~~LOC~~ SOLN
15.0000 [IU] | Freq: Every day | SUBCUTANEOUS | Status: DC
  Administered 2020-03-18: 15 [IU] via SUBCUTANEOUS
  Filled 2020-03-18 (×2): qty 0.15

## 2020-03-18 MED ORDER — MAGNESIUM SULFATE 2 GM/50ML IV SOLN
2.0000 g | Freq: Once | INTRAVENOUS | Status: AC
  Administered 2020-03-18: 2 g via INTRAVENOUS
  Filled 2020-03-18: qty 50

## 2020-03-18 MED ORDER — INSULIN ASPART 100 UNIT/ML ~~LOC~~ SOLN
9.0000 [IU] | Freq: Once | SUBCUTANEOUS | Status: AC
  Administered 2020-03-18: 9 [IU] via SUBCUTANEOUS

## 2020-03-18 NOTE — Progress Notes (Addendum)
ANTICOAGULATION CONSULT NOTE  Pharmacy Consult for warfarin transitioning to apixaban Indication: atrial fibrillation  No Known Allergies  Patient Measurements: Height: 5' (152.4 cm) Weight: 74 kg (163 lb 2.3 oz) IBW/kg (Calculated) : 45.5 Heparin Dosing Weight: ~ 64 kg  Vital Signs: Temp: 98.2 F (36.8 C) (09/06 0419) Temp Source: Oral (09/06 0419) BP: 175/92 (09/06 0419) Pulse Rate: 60 (09/06 0419)  Labs: Recent Labs    03/15/20 1117 03/16/20 0944 03/16/20 0944 03/17/20 0537 03/18/20 0405  HGB  --  10.4*   < > 10.5* 10.5*  HCT  --  30.8*  --  31.3* 32.4*  PLT  --  228  --  220 218  LABPROT  --  27.4*  --  26.5* 34.0*  INR  --  2.7*  --  2.5* 3.5*  HEPARINUNFRC 0.54  --   --   --   --   CREATININE  --  1.67*  --  1.08* 1.41*   < > = values in this interval not displayed.    Estimated Creatinine Clearance: 36.2 mL/min (A) (by C-G formula based on SCr of 1.41 mg/dL (H)).   Medical History: Past Medical History:  Diagnosis Date  . CHF (congestive heart failure) (HCC)   . Diabetes mellitus without complication (HCC)   . Hypertension   . Hypothyroidism   . Paroxysmal atrial fibrillation (HCC)   . Seizures (HCC)     Medications:  Infusions:  . sodium chloride 250 mL (03/16/20 2024)    Assessment: 64 yo female with afib and hx of CVA, on chronic Coumadin admitted with AMS and elevated Scr. PTA warfarin dose 6mg  by mouth daily, last dose 9/1, INR 1.6 on admission. Warfarin held initially for potential dialysis access, heparin started. Now on warfarin only.   Patient's INR is supratherapeutic at 3.5 with INR trending up during hospital stay. CBC stable, LFTs WNL. Per nurse, no bleeding noted.   Pharmacy has now been consulted to switch patient to apixaban.   Goal of Therapy:  INR 2-3 Monitor platelets by anticoagulation protocol: Yes  Plan:  HOLD warfarin x1 Start apixaban when INR <2 Monitor daily PT/INR, CBC, s/s of bleeding  11/1,  PharmD PGY1 Acute Care Pharmacy Resident Phone: [phone] 03/18/2020 9:53 AM  Please check [email or web address] for unit specific pharmacy phone numbers.

## 2020-03-18 NOTE — Progress Notes (Addendum)
Patient glucose 423. MD paged. Lab order placed for stat draw.   RN awaiting call return.   RN received call from MD, who will inform RN on orders.   RN will continue to monitor

## 2020-03-18 NOTE — Progress Notes (Signed)
Family Medicine Teaching Service Daily Progress Note Intern Pager: [phone]  Patient name: Nichole [name] Medical record number: [id number] Date of birth: 1955-08-30 Age: 64 y.o. Gender: female  Primary Care Provider: Patient, No Pcp Per Consultants: Palliative, Nephrology Code Status: DNR  Pt Overview and Major Events to Date:  Hospiutal Day: 5 Admitted for AMS with metabolic acidosis and AKI  Assessment and Plan: Nichole [name] a 64 y.o.femalewho presented w/ altered mental status and found to be in acute renal failure with metabolic acidosis.From the ED, patient was admitted to critical care service for worsening maps. She did not require any pressors and responded to fluid resuscitation. She was transferred to medicine service on 03/16/2020. PMHx s/fHFrEF, paroxysmal A. fib on Coumadin, diabetes, hypertension, hypothyroidism, seizures.  Acute renal failure Stable. Improving, Cr 1.41 today up from 1.08. Voiding well.  Nephrology indicated fine to /dc Bicarb drip. Sigend off on patient. -Repeat BMP tomorrow -D/C Bicarb drip -Strict intake and output -No nephrotoxic medications  Adrenal insufficiency?  Resolving.  Likely in the setting of hypotension, renal failure and sepsis.  -Continue to taper Solucortef (09/03-09/07)  HFpEF Stable. Last ECHO 09/21 with LVEF 55% and G1DD. Euvolemic on exam. -Continue to hold Lasix in the setting of AKI -monitor fluid status -strict intake and output  Paroxysmal a fib  Stable.  Home medication Warfarin. Warfarin restarted 09/04  INR 3.5 yesterday.  Patient fine with Will talk with Pharmacy in regards to switching to  - Consult Pharmacy in regards for switching to Apixaban, if fine will d/c Warfarrin and start Apixaban when INR <2 - Daily PT/INR  Hypertension  SBP elevated to 170 this morning..  Asymptomatic.  Home medications include Hydralazine 100 mg TID, Ramipril 5 ,g daily, Coreg 12.5 mg daily -Continue to monitor BP -Restart Co-Reg  12.5 mg daily, consider restart other medications slowly as BP tolerates  Diabetes Stable. CBG 162-232 overnight/  Received 2 units insulin coverage overnight. Remains on Solucortef taper.  Home medications include Glimiperide 4 mg daily, Lantus 30 mg BID, Metformin 1000 mg BID.  Last A1c 12.9 (07/21) -Continue sSSI -Consider adding home medications slowly as diet and CBG tolerate -Continue Atorvastatin 20 mg daily  Hypothyroidism  Last TSH elevated at 12.202 (07/21)  Home medication Synthroid 125 mcg daily.  TSH WNL -Continue Synthroid 125 mcg daily, titrate pending TSH results   Seizure disorder  Stable.  No seizure activity overnight.  Has appointment with Neurology outpatient 09/17. Home medications Keppra 750 mg BID -Continue Keppra 750 mg BID -Seizure precautions -Continue to monitor for seizure activity  FEN/GI: Full liquid diet  Disposition: Home hospice  Subjective:  Patient indicates she is feeling well today.  Indicates her energy level is at baseline.  Denies chest pain, difficulty breathing, is urinating normally and had normal bowel movement yesterday.  Still interested in being sent home with hospice care.  Objective: Temp:  [97.4 F (36.3 C)-98.4 F (36.9 C)] 98 F (36.7 C) (09/06 1100) Pulse Rate:  [60-77] 62 (09/06 1100) Resp:  [18-20] 18 (09/06 1100) BP: (142-175)/(87-100) 167/87 (09/06 1100) SpO2:  [95 %-96 %] 95 % (09/06 1100) Weight:  [74 kg] 74 kg (09/06 0500)  Physical Exam: Physical Exam Constitutional:      Appearance: Normal appearance.  HENT:     Head: Normocephalic and atraumatic.  Cardiovascular:     Rate and Rhythm: Normal rate and regular rhythm.  Pulmonary:     Effort: Pulmonary effort is normal.     Breath sounds: Normal breath  sounds.  Skin:    General: Skin is warm.     Capillary Refill: Capillary refill takes less than 2 seconds.  Neurological:     Mental Status: She is alert and oriented to person, place, and time.   Psychiatric:        Mood and Affect: Mood normal.        Behavior: Behavior normal.     Laboratory: Recent Labs  Lab 03/16/20 0944 03/17/20 0537 03/18/20 0405  WBC 7.9 10.4 11.7*  HGB 10.4* 10.5* 10.5*  HCT 30.8* 31.3* 32.4*  PLT 228 220 218   Recent Labs  Lab 03/14/20 1745 03/14/20 2134 03/16/20 0944 03/17/20 0537 03/18/20 0405  NA 134*   < > 139 142 139  K 5.4*   < > 3.8 3.3* 3.7  CL 104   < > 107 108 103  CO2 14*   < > 20* 27 25  BUN 88*   < > 39* 24* 18  CREATININE 6.71*   < > 1.67* 1.08* 1.41*  CALCIUM 9.7   < > 8.7* 9.1 9.1  PROT 7.8  --   --   --   --   BILITOT 0.5  --   --   --   --   ALKPHOS 41  --   --   --   --   ALT 21  --   --   --   --   AST 18  --   --   --   --   GLUCOSE 108*   < > 258* 158* 295*   < > = values in this interval not displayed.     Imaging/Diagnostic Tests:    Jovita Kussmaul, MD 03/18/2020, 2:20 PM PGY-1, [facility] Family Medicine FPTS Intern pager: [phone] text pages welcome

## 2020-03-18 NOTE — Evaluation (Signed)
Occupational Therapy Evaluation Patient Details Name: Nichole [name] MRN: [id number] DOB: [birth year] Today's Date: 03/18/2020    History of Present Illness Patient is a 64 y/o female who presents with AMS. Found to have hypoglycemia with possible absence seizure, ARF, uremia, hypotension and acute metabolic encephalopathy. PMH includes asthma, chronic paraoxysmal A-fib, systolic HR, CAD, HTN, CVA, dyslipidemia.   Clinical Impression   This 64 yo female admitted with above presents to acute OT with PLOF of being able to ambulate Mod I with RW and so her own basic ADLs. Currently she needs minguard A when up on her feet for basic ADLs. She will continue to benefit from acute OT with follow up HHOT (she really could benefit due to she wants to stay as independent as possible for as long as possible despite going home on Hospice services).    Follow Up Recommendations  Home health OT;Supervision/Assistance - 24 hour (know she is hospice, but could benefit)    Equipment Recommendations  None recommended by OT       Precautions / Restrictions Precautions Precautions: Fall Restrictions Weight Bearing Restrictions: No      Mobility Bed Mobility Overal bed mobility: Modified Independent Bed Mobility: Supine to Sit     Supine to sit: Modified independent (Device/Increase time);HOB elevated     General bed mobility comments: increased time and use of rail  Transfers Overall transfer level: Needs assistance Equipment used: Rolling walker (2 wheeled) Transfers: Sit to/from Stand Sit to Stand: Min guard         General transfer comment: min guard A to ambulate in hospital room with RW    Balance Overall balance assessment: Needs assistance Sitting-balance support: No upper extremity supported;Feet supported Sitting balance-Leahy Scale: Good     Standing balance support: No upper extremity supported Standing balance-Leahy Scale: Fair Standing balance comment: standing to wash  hands and wash front/back peri area                           ADL either performed or assessed with clinical judgement   ADL Overall ADL's : Needs assistance/impaired Eating/Feeding: Independent;Sitting   Grooming: Min guard;Standing;Wash/dry hands   Upper Body Bathing: Set up;Sitting   Lower Body Bathing: Min guard;Sit to/from stand   Upper Body Dressing : Set up;Sitting   Lower Body Dressing: Min guard;Sit to/from stand   Toilet Transfer: Min guard;Ambulation;RW;Comfort height toilet;Grab bars   Toileting- Clothing Manipulation and Hygiene: Min guard;Sit to/from stand               Vision Patient Visual Report: No change from baseline              Pertinent Vitals/Pain Pain Assessment: No/denies pain     Hand Dominance Right   Extremity/Trunk Assessment Upper Extremity Assessment Upper Extremity Assessment: Generalized weakness           Communication Communication Communication: No difficulties   Cognition Arousal/Alertness: Awake/alert Behavior During Therapy: WFL for tasks assessed/performed Overall Cognitive Status: Within Functional Limits for tasks assessed                                                Home Living Family/patient expects to be discharged to:: Private residence Living Arrangements: Children (daughter presently) Available Help at Discharge: Family;Available PRN/intermittently Type of Home: Apartment Home Access: Stairs to  enter Entrance Stairs-Number of Steps: 10 Entrance Stairs-Rails: Right Home Layout: One level     Bathroom Shower/Tub: Chief Strategy Officer: Standard Bathroom Accessibility: Yes How Accessible: Accessible via walker Home Equipment: Walker - 2 wheels;Cane - single point;Shower seat;Bedside commode   Additional Comments: Pt recently had husband passed away (Julu 11/18/2019), lives with her daughter now and states that she will be "moving in with her son down in  [place] this weekend"--looks like from chart now home with dtr with hospice      Prior Functioning/Environment Level of Independence: Independent  Gait / Transfers Assistance Needed: Independent with ambulation. ADL's / Homemaking Assistance Needed: needs assistance with cooking and cleaning, reports independence with bathing and dressing            OT Problem List: Impaired balance (sitting and/or standing)      OT Treatment/Interventions: Self-care/ADL training;DME and/or AE instruction;Patient/family education;Balance training    OT Goals(Current goals can be found in the care plan section) Acute Rehab OT Goals Patient Stated Goal: to go to dtrs home with Hospice services OT Goal Formulation: With patient Time For Goal Achievement: 04/01/20 Potential to Achieve Goals: Good  OT Frequency: Min 2X/week              AM-PAC OT "6 Clicks" Daily Activity     Outcome Measure Help from another person eating meals?: None Help from another person taking care of personal grooming?: A Little Help from another person toileting, which includes using toliet, bedpan, or urinal?: A Little Help from another person bathing (including washing, rinsing, drying)?: A Little Help from another person to put on and taking off regular upper body clothing?: A Little Help from another person to put on and taking off regular lower body clothing?: A Little 6 Click Score: 19   End of Session Equipment Utilized During Treatment: Gait belt;Rolling walker Nurse Communication: Mobility status (with NT)  Activity Tolerance: Patient tolerated treatment well Patient left: in chair;with call bell/phone within reach;with chair alarm set  OT Visit Diagnosis: Unsteadiness on feet (R26.81);Muscle weakness (generalized) (M62.81)                Time: 0737-1062 OT Time Calculation (min): 32 min Charges:  OT General Charges $OT Visit: 1 Visit OT Evaluation $OT Eval Moderate Complexity: 1 Mod OT  Treatments $Self Care/Home Management : 8-22 mins  Ignacia Palma, OTR/L Acute [employer] Pager [phone] Office [phone]     Nichole [name] 03/18/2020, 10:53 AM

## 2020-03-18 NOTE — Progress Notes (Addendum)
1730- 9 units given per MD order.   RN in to check patient BS results 449.   MD paged.   MD informed RN to recheck Blood glucose 2 hours after novolog given and contact MD if still over 400

## 2020-03-18 NOTE — Hospital Course (Addendum)
Patient arrived at ED in significant renal failure with creatinine of 6.71 above recent baseline of 1.3-1.6 with potassium of 5.4 and anion gap metabolic acidosis and lactic acid of 2.9.  No obvious sources of infection were identified, CT head and abdomen/pelvis pending.  She was given IV fluids and D10 and PCCM consulted for admission.  Eventually was made floor status when condition improved and sent to [facility].  #Neuro Patient initially had AMS.  Improved with fluid rehydration on the next day.  Continued to AandOx3 throughout stay.  Continued to receive home dose of Kepra 750 BID and di not have any seizure episodes while hospitalized.  #Renal Patient had been on ACEi prior to admission which was stopped.   Was fluid resuscitated and Creatinine returned to baseline.  Avoided Nephrotoxic medications and did not restart Ramipril.   #Heart Patient arrived with significant hypotension.  Concern for worsening HFrEF given past EF of 35-40%. Had repeat ECHO which was negative.  Also has history of A-Fib.  Was on Coumadin which was dosed by Pharmacy.  Was stopped on day before D/C and had INR of 2 at D/C and started on Apixaban 5 mg BID.  Was also started back onb CoReg   #Endocrine Patient initially Hypoglycemic in the 40's and 50's. Was put on D10 maintenance fluids and blood sugar began to increase.  Also started on Solucortef which was weaned by day of discharge. Was eventually started on half of home insulin dose with sliding scale.  Was instructed to return to home insulin dose day after discharge. Continued to receive Synthroid throughout hospital stay.  #Infection Patient initially on Azithromycin and Ceftriaxone given concern for infection with symptoms.  No obvious signs of infection identified throughout stay.  Urine Culture and Blood Culture remained negative and Antibiotics were stopped.   #Social Patient indicated she wished to be made DNR and not be brought back to  hospital in the future.  Was evaluated by Hospice but did not qualify.  Will be seen on Outpatient basis by Palliative Care.

## 2020-03-18 NOTE — Progress Notes (Addendum)
Civil engineer, contracting [facility])  *addendum:  Received call back from son Minerva Areola who was in agreement with plan to discharge under outpt palliative.  This write to f/u tomorrow with patient.**  Received request from [facility] for hospice services at home after discharge.  Discussed pt's current status with Dr. Pecola Leisure.  Chart and pt information reviewed by [facility] physician.  Patient deemed not eligible for hospice services at this time, recommended for outpatient palliative with transition over to hospice at home when prognosis is consistenly less than six months.  Hospital liaison was unable to reach pt, daughter or son by phone.  HIPAA compliant voicemail left for daughter Vikki Ports.  Writer will follow up in person tomorrow.  Please call with any questions or concerns. Thank you for the opportunity to participate in this pt's care.  Gillian Scarce, BSN, RN [employer] [phone] (24h on call)

## 2020-03-19 ENCOUNTER — Encounter (HOSPITAL_COMMUNITY): Payer: Self-pay | Admitting: Internal Medicine

## 2020-03-19 LAB — PROTIME-INR
INR: 2 — ABNORMAL HIGH (ref 0.8–1.2)
Prothrombin Time: 21.9 seconds — ABNORMAL HIGH (ref 11.4–15.2)

## 2020-03-19 LAB — CULTURE, BLOOD (ROUTINE X 2)
Culture: NO GROWTH
Culture: NO GROWTH
Special Requests: ADEQUATE
Special Requests: ADEQUATE

## 2020-03-19 LAB — RENAL FUNCTION PANEL
Albumin: 3.3 g/dL — ABNORMAL LOW (ref 3.5–5.0)
Anion gap: 11 (ref 5–15)
BUN: 12 mg/dL (ref 8–23)
CO2: 27 mmol/L (ref 22–32)
Calcium: 9.5 mg/dL (ref 8.9–10.3)
Chloride: 104 mmol/L (ref 98–111)
Creatinine, Ser: 1.02 mg/dL — ABNORMAL HIGH (ref 0.44–1.00)
GFR calc Af Amer: >60 mL/min (ref >60)
GFR calc non Af Amer: 58 mL/min — ABNORMAL LOW (ref >60)
Glucose, Bld: 164 mg/dL — ABNORMAL HIGH (ref 70–99)
Phosphorus: 2.9 mg/dL (ref 2.5–4.6)
Potassium: 3.3 mmol/L — ABNORMAL LOW (ref 3.5–5.1)
Sodium: 142 mmol/L (ref 135–145)

## 2020-03-19 LAB — CBC
HCT: 36.7 % (ref 36.0–46.0)
Hemoglobin: 11.8 g/dL — ABNORMAL LOW (ref 12.0–15.0)
MCH: 28.7 pg (ref 26.0–34.0)
MCHC: 32.2 g/dL (ref 30.0–36.0)
MCV: 89.3 fL (ref 80.0–100.0)
Platelets: 230 10*3/uL (ref 150–400)
RBC: 4.11 MIL/uL (ref 3.87–5.11)
RDW: 12.6 % (ref 11.5–15.5)
WBC: 13.1 10*3/uL — ABNORMAL HIGH (ref 4.0–10.5)
nRBC: 0.4 % — ABNORMAL HIGH (ref 0.0–0.2)

## 2020-03-19 LAB — GLUCOSE, CAPILLARY
Glucose-Capillary: 173 mg/dL — ABNORMAL HIGH (ref 70–99)
Glucose-Capillary: 126 mg/dL — ABNORMAL HIGH (ref 70–99)

## 2020-03-19 LAB — MAGNESIUM: Magnesium: 1.5 mg/dL — ABNORMAL LOW (ref 1.7–2.4)

## 2020-03-19 MED ORDER — MAGNESIUM SULFATE 2 GM/50ML IV SOLN
2.0000 g | Freq: Once | INTRAVENOUS | Status: AC
  Administered 2020-03-19: 2 g via INTRAVENOUS
  Filled 2020-03-19: qty 50

## 2020-03-19 MED ORDER — APIXABAN 5 MG PO TABS: 5.0000 mg | ORAL_TABLET | Freq: Two times a day (BID) | ORAL | 0 refills | Status: DC

## 2020-03-19 MED ORDER — METFORMIN HCL 500 MG PO TABS
500.0000 mg | ORAL_TABLET | Freq: Two times a day (BID) | ORAL | 0 refills | Status: DC
Start: 2020-03-19 — End: 2020-03-25

## 2020-03-19 MED ORDER — LANTUS SOLOSTAR 100 UNIT/ML ~~LOC~~ SOPN: 15.0000 [IU] | PEN_INJECTOR | Freq: Every day | SUBCUTANEOUS | 11 refills | Status: AC

## 2020-03-19 MED ORDER — POTASSIUM CHLORIDE CRYS ER 20 MEQ PO TBCR
40.0000 meq | EXTENDED_RELEASE_TABLET | Freq: Once | ORAL | Status: AC
  Administered 2020-03-19: 40 meq via ORAL
  Filled 2020-03-19: qty 2

## 2020-03-19 MED ORDER — FUROSEMIDE 40 MG PO TABS: 40.0000 mg | ORAL_TABLET | ORAL | 0 refills | Status: DC | PRN

## 2020-03-19 MED ORDER — LEVETIRACETAM ER 750 MG PO TB24: 750.0000 mg | ORAL_TABLET | Freq: Two times a day (BID) | ORAL | 1 refills | Status: DC

## 2020-03-19 NOTE — Progress Notes (Signed)
   03/19/20 1325  Clinical Encounter Type  Visited With Patient  Visit Type Initial;Spiritual support (Advance Directive)  Referral From Palliative care team  Consult/Referral To Chaplain  Spiritual Encounters  Spiritual Needs Prayer  Stress Factors  Patient Stress Factors Major life changes;Family relationships  This chaplain responded PMT consult for spiritual care.  The chaplain introduced herself to the Pt. RN-Barbie during the visit.  The chaplain understands the Pt. is anticipating transition to her daughter's home today. The Pt. is tearful with the chaplain and appreciated the benefits of sharing her emotions with another person.  The chaplain listened as the Pt. identified with her grief after the recent loss of her husband and a good friend.  The Pt. expressed an interest in and is open to pursing grief counseling as a Pt. with Out Patient Palliative Care.  The Pt. also has another friend with whom she plans to connect with soon.   The chaplain discussed the purpose of an Advance Directive with the Pt. The Pt. communicated her interest in assigning her daughter-Nichole [name] the role of HCPOA. The Pt. stated she will be living with Nichole [name] in [place].  The close proximity of the Pt. daughter informed her decision.  The chaplain understands the Pt. will talk to both children before completing an AD. The Pt. completed AD documentation without notarization is lying in the Pt. windowsill.  The chaplain heard the theme of "my choice" reflected throughout the time with the Pt. The Pt. communicated the joy she receives in the presence of family and her faith.  The Pt. shared her fondness of cook outs and is looking forward to planning one with family when she leaves the hospital. The Pt. accepted the chaplains invitation for prayer and welcomed reminder of God's continued love for the Pt.Marland Kitchen

## 2020-03-19 NOTE — TOC Transition Note (Signed)
Transition of Care [facility]) - CM/SW Discharge Note   Patient Details  Name: Nichole [name] MRN: [id number] Date of Birth: Apr 26, 1956  [facility] CM/SW Contact:  Leone Haven, RN Phone Number: 03/19/2020, 4:59 PM   Clinical Narrative:    Patient is for dc home today, has no preference for agency, [facility] is able to take  Referral for  HHPT, HHOT.  Soc will begin 24 to 48 hrs post dc.    Final next level of care: Home w Home Health Services Barriers to Discharge: No Barriers Identified   Patient Goals and CMS Choice Patient states their goals for this hospitalization and ongoing recovery are:: get better   Choice offered to / list presented to : Adult Children  Discharge Placement                       Discharge Plan and Services     Post Acute Care Choice: Hospice            DME Agency: NA       HH Arranged: PT, OT HH Agency: [facility] Date HH Agency Contacted: 03/19/20 Time HH Agency Contacted: 1659 Representative spoke with at [facility] Agency: Lupita Leash  Social Determinants of Health (SDOH) Interventions     Readmission Risk Interventions Readmission Risk Prevention Plan 02/06/2020  Post Dischage Appt Complete  Medication Screening Complete  Transportation Screening Complete

## 2020-03-19 NOTE — Plan of Care (Signed)
  Problem: Coping: Goal: Level of anxiety will decrease Outcome: Progressing   Problem: Skin Integrity: Goal: Risk for impaired skin integrity will decrease Outcome: Progressing   

## 2020-03-19 NOTE — Progress Notes (Signed)
Physical Therapy Treatment Patient Details Name: Nichole [name] MRN: [id number] DOB: [birth year] Today's Date: 03/19/2020    History of Present Illness Patient is a 64 y/o female who presents with AMS. Found to have hypoglycemia with possible absence seizure, ARF, uremia, hypotension and acute metabolic encephalopathy. PMH includes asthma, chronic paraoxysmal A-fib, systolic HR, CAD, HTN, CVA, dyslipidemia.    PT Comments    Pt admitted with above diagnosis. Pt was able to ambulate with RW with better safety with min guard assist and no LOB.  Did lose balance without device.  Able to practice steps as well and needs min guard aasist on steps.  PRogressing. STates that daughter will be at home with her.  Pt currently with functional limitations due to the deficits listed below (see PT Problem List). Pt will benefit from skilled PT to increase their independence and safety with mobility to allow discharge to the venue listed below.     Follow Up Recommendations  Home health PT;Supervision for mobility/OOB     Equipment Recommendations  None recommended by PT    Recommendations for Other Services       Precautions / Restrictions Precautions Precautions: Fall Restrictions Weight Bearing Restrictions: No    Mobility  Bed Mobility Overal bed mobility: Modified Independent Bed Mobility: Supine to Sit     Supine to sit: Modified independent (Device/Increase time);HOB elevated     General bed mobility comments: increased time and use of rail  Transfers Overall transfer level: Needs assistance Equipment used: Rolling walker (2 wheeled) Transfers: Sit to/from Stand Sit to Stand: Min guard            Ambulation/Gait Ambulation/Gait assistance: Min assist;Min guard Gait Distance (Feet): 180 Feet Assistive device: Rolling walker (2 wheeled);None Gait Pattern/deviations: Step-through pattern;Decreased stride length;Narrow base of support;Drifts right/left;Staggering left Gait  velocity: decreased Gait velocity interpretation: <1.31 ft/sec, indicative of household ambulator General Gait Details: Slow, unsteady gait without device losing balance needing min assist.  Pt did better with RW with better balance. Pt with no significant LOB with RW.    Stairs Stairs: Yes Stairs assistance: Min guard;Supervision Stair Management: One rail Right;Step to pattern;Forwards Number of Stairs: 15 General stair comments: Pt was able to ascend and descend steps without assist. Did guard pt for safety.    Wheelchair Mobility    Modified Rankin (Stroke Patients Only)       Balance Overall balance assessment: Needs assistance Sitting-balance support: No upper extremity supported;Feet supported Sitting balance-Leahy Scale: Good     Standing balance support: No upper extremity supported Standing balance-Leahy Scale: Poor Standing balance comment: relies on RW for UE support.                            Cognition Arousal/Alertness: Awake/alert Behavior During Therapy: WFL for tasks assessed/performed Overall Cognitive Status: Within Functional Limits for tasks assessed                                        Exercises General Exercises - Lower Extremity Ankle Circles/Pumps: AROM;Both;10 reps;Seated Long Arc Quad: AROM;Both;10 reps;Seated    General Comments        Pertinent Vitals/Pain Pain Assessment: No/denies pain    Home Living                      Prior Function  PT Goals (current goals can now be found in the care plan section) Acute Rehab PT Goals Patient Stated Goal: to go to dtrs home with Hospice services Progress towards PT goals: Progressing toward goals    Frequency    Min 3X/week      PT Plan Current plan remains appropriate    Co-evaluation              AM-PAC PT "6 Clicks" Mobility   Outcome Measure  Help needed turning from your back to your side while in a flat bed  without using bedrails?: A Little Help needed moving from lying on your back to sitting on the side of a flat bed without using bedrails?: A Little Help needed moving to and from a bed to a chair (including a wheelchair)?: A Little Help needed standing up from a chair using your arms (e.g., wheelchair or bedside chair)?: A Little Help needed to walk in hospital room?: A Little Help needed climbing 3-5 steps with a railing? : A Little 6 Click Score: 18    End of Session Equipment Utilized During Treatment: Gait belt Activity Tolerance: Patient tolerated treatment well Patient left: in chair;with call bell/phone within reach;with chair alarm set Nurse Communication: Mobility status PT Visit Diagnosis: Muscle weakness (generalized) (M62.81);Difficulty in walking, not elsewhere classified (R26.2);Unsteadiness on feet (R26.81)     Time: 5625-6389 PT Time Calculation (min) (ACUTE ONLY): 21 min  Charges:  $Gait Training: 8-22 mins                     Sinai Mahany W,PT Acute Rehabilitation Services Pager:  [phone]  Office:  [phone]     Berline Lopes 03/19/2020, 7:20 AM

## 2020-03-19 NOTE — Discharge Instructions (Signed)
It was great to meet you Nichole [name].  You were diagnosed with an Acute Kidney Failure which went away with treatment including IV fluids and Sodium Bicarbonate.  We also gave her supplements to balance out her Potassium and Magnesium.  You had a discussion with our palliative team and indicated that you wished to be DNR and not brought back to the hospital again in the future   Discharge Instructions: We are ordering your medications to be sent home with you.  Please schedule a hospital follow-up visit with [place] at [facility].  Other Instructions: Blood Pressure Medications- Continue to take your CoReg 12.5 mg twice a day.  Do not restart your Hydralazine or Ramipril.    A-fib Medications-  We are changing your Warfarin and switching you to a blood thinner called Eliquis 5 mg to take twice a day.    Continue to take the rest of your other medications as prescribed.  Please stay in touch with Palliative Care and follow-up with them after leaving the hospital.  If have not heard from them in 2 weeks please contact our office.  Please follow up with [place] at [facility].  Our address is [address] [place], [place] [address].  Our Phone Number is [phone].    We have scheduled an appointment on 03/25/20 at 9:10 AM.  If you cannot make this appointment please call to reschedule.

## 2020-03-19 NOTE — Plan of Care (Signed)

## 2020-03-19 NOTE — Plan of Care (Signed)
  Problem: Education: Goal: Knowledge of General Education information will improve Description: Including pain rating scale, medication(s)/side effects and non-pharmacologic comfort measures 03/19/2020 1638 by Saunders Revel, RN Outcome: Adequate for Discharge 03/19/2020 1508 by Saunders Revel, RN Outcome: Adequate for Discharge   Problem: Health Behavior/Discharge Planning: Goal: Ability to manage health-related needs will improve 03/19/2020 1638 by Saunders Revel, RN Outcome: Adequate for Discharge 03/19/2020 1508 by Saunders Revel, RN Outcome: Adequate for Discharge   Problem: Clinical Measurements: Goal: Ability to maintain clinical measurements within normal limits will improve 03/19/2020 1638 by Saunders Revel, RN Outcome: Adequate for Discharge 03/19/2020 1508 by Saunders Revel, RN Outcome: Adequate for Discharge Goal: Will remain free from infection 03/19/2020 1638 by Saunders Revel, RN Outcome: Adequate for Discharge 03/19/2020 1508 by Saunders Revel, RN Outcome: Adequate for Discharge Goal: Diagnostic test results will improve 03/19/2020 1638 by Saunders Revel, RN Outcome: Adequate for Discharge 03/19/2020 1508 by Saunders Revel, RN Outcome: Adequate for Discharge Goal: Respiratory complications will improve 03/19/2020 1638 by Saunders Revel, RN Outcome: Adequate for Discharge 03/19/2020 1508 by Saunders Revel, RN Outcome: Adequate for Discharge Goal: Cardiovascular complication will be avoided 03/19/2020 1638 by Saunders Revel, RN Outcome: Adequate for Discharge 03/19/2020 1508 by Saunders Revel, RN Outcome: Adequate for Discharge   Problem: Activity: Goal: Risk for activity intolerance will decrease 03/19/2020 1638 by Saunders Revel, RN Outcome: Adequate for Discharge 03/19/2020 1508 by Saunders Revel, RN Outcome: Adequate for Discharge   Problem: Nutrition: Goal: Adequate nutrition will be maintained 03/19/2020 1638 by  Saunders Revel, RN Outcome: Adequate for Discharge 03/19/2020 1508 by Saunders Revel, RN Outcome: Adequate for Discharge   Problem: Coping: Goal: Level of anxiety will decrease 03/19/2020 1638 by Saunders Revel, RN Outcome: Adequate for Discharge 03/19/2020 1508 by Saunders Revel, RN Outcome: Adequate for Discharge   Problem: Elimination: Goal: Will not experience complications related to bowel motility 03/19/2020 1638 by Saunders Revel, RN Outcome: Adequate for Discharge 03/19/2020 1508 by Saunders Revel, RN Outcome: Adequate for Discharge Goal: Will not experience complications related to urinary retention 03/19/2020 1638 by Saunders Revel, RN Outcome: Adequate for Discharge 03/19/2020 1508 by Saunders Revel, RN Outcome: Adequate for Discharge   Problem: Pain Managment: Goal: General experience of comfort will improve 03/19/2020 1638 by Saunders Revel, RN Outcome: Adequate for Discharge 03/19/2020 1508 by Saunders Revel, RN Outcome: Adequate for Discharge   Problem: Safety: Goal: Ability to remain free from injury will improve 03/19/2020 1638 by Saunders Revel, RN Outcome: Adequate for Discharge 03/19/2020 1508 by Saunders Revel, RN Outcome: Adequate for Discharge   Problem: Skin Integrity: Goal: Risk for impaired skin integrity will decrease 03/19/2020 1638 by Saunders Revel, RN Outcome: Adequate for Discharge 03/19/2020 1508 by Saunders Revel, RN Outcome: Adequate for Discharge

## 2020-03-19 NOTE — Plan of Care (Signed)

## 2020-03-19 NOTE — Care Management Important Message (Signed)
Important Message  Patient Details  Name: Nichole [name] MRN: [id number] Date of Birth: 1955/11/26   Medicare Important Message Given:  Yes     Dorena Bodo 03/19/2020, 3:14 PM

## 2020-03-19 NOTE — Progress Notes (Signed)
Discharge instructions given to patient she verbalized understanding of instructions given. Patient's daughter informed of discharge and need to pick her mom up reported would be here in 45 mins needed to stop and get her some clothes for ride home. PIV removed with catheter intact.

## 2020-03-19 NOTE — Progress Notes (Addendum)
Family Medicine Teaching Service Daily Progress Note Intern Pager: [phone]  Patient name: Nichole [name] Medical record number: [id number] Date of birth: 05/08/1956 Age: 64 y.o. Gender: female  Primary Care Provider: Patient, No Pcp Per Consultants: Palliative, CCM, Nephrology  Code Status: DNR   Pt Overview and Major Events to Date:  Hospiutal Day: 5 Admitted for AMS with metabolic acidosis and AKI   Assessment and Plan: 64 .femalewho presented w/ altered mental status and found to be in acute renal failure with metabolic acidosis and hypoglycemia, likely from medications. Admitted to CCM and transferred to [facility] on 9/6.  PMHx s/fHFrEF, paroxysmal A. fib on Coumadin, diabetes, hypertension, hypothyroidism, seizures.  Acute renal failure Stable. Improving,Cr 1.41 today up from 1.08. - Avoid NSAIDs, ACE, ARBS.   Hypotension, resolved, due to adrenal insufficiency. Infectious evaluation negative. Finishing steroids today.  Resolving. Likely in the setting of hypotension, renal failure and sepsis.  -Continue to taper Solucortef (09/03-09/07)  HFpEF Stable. Last ECHO 09/21 with LVEF 55%and G1DD.Euvolemic on exam. -Will restart Lasix PRN on discharge.   Paroxysmal a fib  Stable. Home medication Warfarin. - Transition to apixaban starting tomorrow INR is 2.0 today   Hypertension  Chronic problem, now returned since no longer with AKI/possible AI .  Asymptomatic. Home medications include Hydralazine 100 mg TID, Ramipril 5 ,g daily, Coreg 12.5 mg daily - Avoid ACE/ARBS in future - Restarted Carvedilol  Hypokalemia- repleted  Hypomagnesemia- repleted   Diabetes, type 2, on insulin  Hyperglycemic  Home medications include Glimiperide 4 mg daily, Lantus 30 mg BID, Metformin 1000 mg BID. Last A1c 12.9 (07/21) - Uncontrolled yesterday, restarted glargine - Will minimize agents on DC given goals of care   Hypothyroidism  Last TSH elevated at 12.202 (07/21) Home  medication Synthroid 125 mcg daily.  TSH WNL -Continue Synthroid 125 mcg daily, TSH normal   Seizure disorder  Stable. No seizure activity overnight. Has appointment with Neurology outpatient 09/17. Home medications Keppra 750 mg BID -Continue Keppra 750 mg BID -Seizure precautions -Continue to monitor for seizure activity  FEN/GI: Carb consistent   Disposition: Home today with palliative care services, plans to transition to hospice as outpatient   Subjective:  No concerns. Feels well. She wishes to go home. No dizziness or CP.   Objective: Temp:  [97.6 F (36.4 C)-98.4 F (36.9 C)] 97.9 F (36.6 C) (09/07 0729) Pulse Rate:  [59-83] 59 (09/07 1100) Resp:  [13-25] 25 (09/07 1100) BP: (133-147)/(84-122) 141/86 (09/07 1100) SpO2:  [95 %-97 %] 96 % (09/07 1100) Weight:  [69.8 kg] 69.8 kg (09/07 0502) Physical Exam: General: Edematous lower lip  Cardiovascular: RRR Respiratory: Clear bilaterally  Abdomen: Soft, minimally distended, non tender Extremities: Trace Edema   Laboratory: Recent Labs  Lab 03/17/20 0537 03/18/20 0405 03/19/20 0716  WBC 10.4 11.7* 13.1*  HGB 10.5* 10.5* 11.8*  HCT 31.3* 32.4* 36.7  PLT 220 218 230   Recent Labs  Lab 03/14/20 1745 03/14/20 2134 03/17/20 0537 03/17/20 0537 03/18/20 0405 03/18/20 1628 03/19/20 0716  NA 134*   < > 142  --  139  --  142  K 5.4*   < > 3.3*  --  3.7  --  3.3*  CL 104   < > 108  --  103  --  104  CO2 14*   < > 27  --  25  --  27  BUN 88*   < > 24*  --  18  --  12  CREATININE 6.71*   < >  1.08*  --  1.41*  --  1.02*  CALCIUM 9.7   < > 9.1  --  9.1  --  9.5  PROT 7.8  --   --   --   --   --   --   BILITOT 0.5  --   --   --   --   --   --   ALKPHOS 41  --   --   --   --   --   --   ALT 21  --   --   --   --   --   --   AST 18  --   --   --   --   --   --   GLUCOSE 108*   < > 158*   < > 295* 444* 164*   < > = values in this interval not displayed.     Imaging/Diagnostic Tests: None new   Terisa Starr, MD  [facility] Medicine Teaching Service

## 2020-03-20 ENCOUNTER — Telehealth: Payer: Self-pay

## 2020-03-20 NOTE — Telephone Encounter (Signed)
Gerilyn from [facility] calling for PT verbal orders as follows:  1 time(s) weekly for 1 week(s), then 2 time(s) weekly for 2 week(s)  Verbal orders given per [facility] protocol. Informed PT that if patient misses upcoming follow up appointment we will not be able to provide additional orders for home health.   Veronda Prude, RN

## 2020-03-21 NOTE — Discharge Summary (Addendum)
Family Medicine Teaching [facility] Discharge Summary  Patient name: Nichole [name] Medical record number: [id number] Date of birth: March 22, 1956 Age: 64 y.o. Gender: female Date of Admission: 03/14/2020  Date of Discharge: 03/19/20 Admitting Physician: Lorin Glass, MD  Primary Care Provider: Patient, No Pcp Per Consultants: None  Indication for Hospitalization: Altered mental status, ARF, Metabolic Acidosis  Discharge Diagnoses/Problem List: Acute Renal Failure (resolved), HFpEF, A-fib, Hypertension, Diabetes, Hypothyroidism, and Seizure Disorder.   Disposition: Safe to be discharged home with follow-up early next week.  Discharge Condition: Stable  Discharge Exam:   Physical Exam Constitutional:      General: She is not in acute distress.    Appearance: Normal appearance. She is obese.  HENT:     Head: Normocephalic and atraumatic.     Mouth/Throat:     Mouth: Mucous membranes are moist.  Cardiovascular:     Rate and Rhythm: Normal rate and regular rhythm.     Pulses: Normal pulses.  Pulmonary:     Effort: Pulmonary effort is normal. No respiratory distress.     Breath sounds: Normal breath sounds. No stridor. No wheezing or rhonchi.  Abdominal:     General: Abdomen is flat. There is no distension.     Tenderness: There is no abdominal tenderness.  Skin:    General: Skin is warm.     Capillary Refill: Capillary refill takes less than 2 seconds.  Neurological:     General: No focal deficit present.     Mental Status: She is alert and oriented to person, place, and time.     Comments: Patient responds a bit slow, is baseline per daughter  Psychiatric:        Mood and Affect: Mood normal.        Behavior: Behavior normal.    Brief Hospital Course:  Patient arrived at ED in significant renal failure with creatinine of 6.71 above recent baseline of 1.3-1.6 with potassium of 5.4 and anion gap metabolic acidosis and lactic acid of 2.9.  No obvious sources of infection  were identified, CT head and abdomen/pelvis pending.  She initially was admitted to ICU.  Acute Renal Failure- resolved Patient had been on ACEi prior to admission which was stopped.  Patient received IVF resuscitated and kidney function returned to baseline, at 1.02 on day of discharge.  Ramipril was discontinued as it likely contributed to kidney injury, however underlying etiology for severity of kidney injury not fully elucidated. Likely multifactorial. Recommend continued monitoring as an outpatient.  Altered mental status- resolved Patient initially had AMS in setting of ARF. Patient improved with IVF and returned to baseline cognition of AOx4. Patient has a seizure disorder and continued to receive home dose of Keppra 750mg  BID and did not have any seizure episodes while hospitalized.  HFpEF Patient arrived with significant hypotension.  Concern for worsening HFrEF given past EF of 35-40%. Repeat echo demonstrated preserved EF of 55-60% with G1DD. The echo report suggested last echo underestimated EF.   DMT2 Patient initially Hypoglycemic in the 40's and 50's. Corrected with IVF and returned to baseline. Patient also required IV glucocorticoids due to hypotension which was weaned by day of discharge. Home medications of dapagliflozin and glimepiride discontinued due to hypoglycemia. Patient started on half home dose of lantus, 15U, and recommended to check sugar daily. Recommend patient follow up in 1 week with PCP and increase lantus as indicated.  Goals of Care   PAF Patient indicated she wished to be made DNR and not  be brought back to hospital in the future. She wished to go to hospice, however, her since her kidneys made a remarkable recovery and she returned to baseline with improved EF, she does not have any diagnoses that will lead to imminent demise.  Hospice recommended that she continue to work with palliative care as an outpatient. Patient also has history of PAF. She was on  warfarin for anticoagulation, but switched to a DOAC to decrease monitoring with GOC status change (see below). Palliative care's conversation with patient was that she wishes to prevent strokes which may reduce her quality of life.  HTN Hypertensive medications held initially due to significant hypotension, but patient restarted on Coreg at time of discharge. Hydralazine continued to be held. Follow up with PCP and can consider restarting hydralazine if BP indicates.  Issues for Follow Up:  1. Please follow kidney function outpatient. Recommend no ACEi. 2. Diabetes: Dapagliflozin and glimepiride discontinued. Sent home on 15U Lantus, recommend follow up and increase as indicated. 3. Blood Pressure: consider restarting Hydralazine for BP control. Held due to hypotension. 4. Continue to follow up with palliative care  Significant Procedures: none  Significant Labs and Imaging:  Recent Labs  Lab 03/17/20 0537 03/18/20 0405 03/19/20 0716  WBC 10.4 11.7* 13.1*  HGB 10.5* 10.5* 11.8*  HCT 31.3* 32.4* 36.7  PLT 220 218 230   Recent Labs  Lab 03/14/20 1745 03/14/20 2134 03/15/20 0322 03/15/20 0322 03/16/20 0944 03/16/20 0944 03/17/20 0537 03/17/20 0537 03/18/20 0405 03/18/20 1628 03/19/20 0716  NA 134*   < > 132*  --  139  --  142  --  139  --  142  K 5.4*   < > 4.2   < > 3.8   < > 3.3*   < > 3.7  --  3.3*  CL 104   < > 105  --  107  --  108  --  103  --  104  CO2 14*   < > 14*  --  20*  --  27  --  25  --  27  GLUCOSE 108*   < > 218*   < > 258*  --  158*  --  295* 444* 164*  BUN 88*   < > 73*  --  39*  --  24*  --  18  --  12  CREATININE 6.71*   < > 4.90*  --  1.67*  --  1.08*  --  1.41*  --  1.02*  CALCIUM 9.7   < > 8.3*  --  8.7*  --  9.1  --  9.1  --  9.5  MG  --   --  1.3*  --  1.3*  --  1.7  --   --   --  1.5*  PHOS  --   --  4.6  --  3.0  --  2.1*  --  2.1*  --  2.9  ALKPHOS 41  --   --   --   --   --   --   --   --   --   --   AST 18  --   --   --   --   --   --   --    --   --   --   ALT 21  --   --   --   --   --   --   --   --   --   --  ALBUMIN 4.1  --   --   --  3.2*  --  3.2*  --  3.0*  --  3.3*   < > = values in this interval not displayed.   CT HEAD WITHOUT CONTRAST COMPARISON:  02/03/2020  FINDINGS: Brain: Chronic hypodensities are seen within the bilateral basal ganglia, bilateral thalami, and periventricular white matter consistent with chronic small vessel ischemic changes. Focal area of encephalomalacia are right posterior cerebellar hemisphere also likely consistent with chronic ischemic change. No evidence of acute infarct or hemorrhage. The lateral ventricles and midline structures are stable. No acute extra-axial fluid collections. No mass effect. Vascular: No hyperdense vessel or unexpected calcification. Skull: Normal. Negative for fracture or focal lesion. Sinuses/Orbits: No acute finding. Other: None. IMPRESSION: 1. Stable chronic ischemic changes as above. No acute intracranial process.  CT ABDOMEN AND PELVIS WITHOUT CONTRAST FINDINGS: Lower chest: Lung bases are clear. Cardiomegaly with pacer leads in the coronary sinus, right atrium and right ventricular apex. Trace pericardial fluid is likely within physiologic normal. Lower thoracic aortic atherosclerosis. Hepatobiliary: No focal liver abnormality is visible on this unenhanced CT. Smooth liver surface contour. Normal liver attenuation. Patient is post cholecystectomy. Slight prominence of the biliary tree likely related to reservoir effect. No calcified intraductal gallstones. Pancreas: Partial fatty replacement of the pancreas. No pancreatic ductal dilatation or surrounding inflammatory changes. Spleen: Normal in size. No concerning splenic lesions. Adrenals/Urinary Tract: Normal adrenal glands. Kidneys normally located. Asymmetric left upper and interpolar scarring. No visible suspicious renal lesions. No perinephric stranding or inflammatory changes are seen.  No obstructive urolithiasis or hydronephrosis is seen. Urinary bladder is decompressed by inflated Foley catheter. Mild wall thickening is nonspecific given underdistention. Few foci of gas within the bladder lumen likely related to instrumentation. Stomach/Bowel: Distal esophagus, stomach and duodenal sweep are unremarkable. No small bowel wall thickening or dilatation. No evidence of obstruction. Appendix is not visualized. No focal inflammation the vicinity of the cecum to suggest an occult appendicitis. No colonic dilatation or wall thickening. Vascular/Lymphatic: Atherosclerotic calcifications within the abdominal aorta and branch vessels. No aneurysm or ectasia. No enlarged abdominopelvic lymph nodes. None a career up by high Reproductive: Patient appears to be post hysterectomy with quiescent retained ovarian tissues. No concerning adnexal lesions. Other: No abdominopelvic free fluid or free gas. No bowel containing hernias. Ventral diastasis recti. Musculoskeletal: Mild degenerative changes in the spine, hips and pelvis. No acute osseous abnormality or suspicious osseous lesion.  IMPRESSION: 1. No obstructive urolithiasis or hydronephrosis. 2. Asymmetric left upper and interpolar scarring. 3. Bladder is decompressed by inflated Foley catheter. Mild wall thickening is nonspecific given underdistention. Consider correlation with urinalysis to exclude cystitis. 4. Cardiomegaly. 5. Aortic Atherosclerosis (ICD10-I70.0).  Results/Tests Pending at Time of Discharge: None  Discharge Medications:  Allergies as of 03/19/2020   No Known Allergies     Medication List    STOP taking these medications   dapagliflozin propanediol 5 MG Tabs tablet Commonly known as: Farxiga   glimepiride 4 MG tablet Commonly known as: AMARYL   hydrALAZINE 100 MG tablet Commonly known as: APRESOLINE   ramipril 5 MG capsule Commonly known as: ALTACE   warfarin 6 MG tablet Commonly known as:  COUMADIN     TAKE these medications   apixaban 5 MG Tabs tablet Commonly known as: ELIQUIS Take 1 tablet (5 mg total) by mouth 2 (two) times daily.   atorvastatin 20 MG tablet Commonly known as: LIPITOR Take 1 tablet (20 mg total) by mouth daily.  carvedilol 12.5 MG tablet Commonly known as: COREG Take 12.5 mg by mouth in the morning and at bedtime.   furosemide 40 MG tablet Commonly known as: LASIX Take 1 tablet (40 mg total) by mouth as needed for fluid or edema (Use when swelling worsening or gain more than 5 lbs in 1 week.). What changed:   when to take this  reasons to take this   Lantus SoloStar 100 UNIT/ML Solostar Pen Generic drug: insulin glargine Inject 15 Units into the skin at bedtime. What changed:   how much to take  when to take this   Levetiracetam 750 MG Tb24 Take 1 tablet (750 mg total) by mouth 2 (two) times daily.   levothyroxine 125 MCG tablet Commonly known as: SYNTHROID Take 125 mcg by mouth every morning.   metFORMIN 500 MG tablet Commonly known as: GLUCOPHAGE Take 1 tablet (500 mg total) by mouth 2 (two) times daily with a meal. What changed:   medication strength  how much to take  when to take this   Myrbetriq 25 MG Tb24 tablet Generic drug: mirabegron ER Take 25 mg by mouth daily.   Vitamin D3 1.25 MG (50000 UT) Caps Take 50,000 Units by mouth every Friday.       Discharge Instructions: Please refer to Patient Instructions section of EMR for full details.  Patient was counseled important signs and symptoms that should prompt return to medical care, changes in medications, dietary instructions, activity restrictions, and follow up appointments.   Follow-Up Appointments:  Follow-up Information    [facility] Follow up in 6 day(s).   Specialty: Family Medicine Why: 03/25/20 9:10 AM  Please arrive at 8:55 AM on this date Contact information: [address] [id number] mc [place]  [place] [address] [phone]       [facility] Follow up.   Why: HHPT, HHOT       [facility] Follow up.   Why: outpatient palliative services Contact information: [address] [place] [address] [phone]              Jovita Kussmaul, MD 03/21/2020, 2:13 PM PGY-1, [facility]

## 2020-03-25 ENCOUNTER — Other Ambulatory Visit: Payer: Self-pay

## 2020-03-25 ENCOUNTER — Ambulatory Visit (INDEPENDENT_AMBULATORY_CARE_PROVIDER_SITE_OTHER): Payer: Medicare Other | Admitting: Family Medicine

## 2020-03-25 VITALS — BP 132/80 | HR 86 | Ht 60.0 in | Wt 153.2 lb

## 2020-03-25 DIAGNOSIS — E119 Type 2 diabetes mellitus without complications: Secondary | ICD-10-CM | POA: Diagnosis not present

## 2020-03-25 DIAGNOSIS — I10 Essential (primary) hypertension: Secondary | ICD-10-CM

## 2020-03-25 DIAGNOSIS — E038 Other specified hypothyroidism: Secondary | ICD-10-CM | POA: Diagnosis not present

## 2020-03-25 DIAGNOSIS — E1169 Type 2 diabetes mellitus with other specified complication: Secondary | ICD-10-CM

## 2020-03-25 DIAGNOSIS — Z8639 Personal history of other endocrine, nutritional and metabolic disease: Secondary | ICD-10-CM

## 2020-03-25 DIAGNOSIS — E559 Vitamin D deficiency, unspecified: Secondary | ICD-10-CM

## 2020-03-25 DIAGNOSIS — N182 Chronic kidney disease, stage 2 (mild): Secondary | ICD-10-CM | POA: Diagnosis not present

## 2020-03-25 DIAGNOSIS — E785 Hyperlipidemia, unspecified: Secondary | ICD-10-CM | POA: Diagnosis not present

## 2020-03-25 DIAGNOSIS — N170 Acute kidney failure with tubular necrosis: Secondary | ICD-10-CM | POA: Diagnosis present

## 2020-03-25 DIAGNOSIS — L602 Onychogryphosis: Secondary | ICD-10-CM | POA: Diagnosis not present

## 2020-03-25 MED ORDER — CARVEDILOL 12.5 MG PO TABS: 12.5000 mg | ORAL_TABLET | Freq: Two times a day (BID) | ORAL | 1 refills | Status: DC

## 2020-03-25 MED ORDER — METFORMIN HCL 500 MG PO TABS: 500.0000 mg | ORAL_TABLET | Freq: Two times a day (BID) | ORAL | 0 refills | Status: DC

## 2020-03-25 MED ORDER — LEVOTHYROXINE SODIUM 125 MCG PO TABS: 125.0000 ug | ORAL_TABLET | Freq: Every morning | ORAL | 1 refills | Status: DC

## 2020-03-25 NOTE — Progress Notes (Signed)
    SUBJECTIVE:   CHIEF COMPLAINT / HPI: Hospital follow-up  Nichole [name] is a 64 year old female presenting with her daughter for a hospital follow-up. Reports she is doing well today and has no concerns other than addressing below. Acting back at baseline and living with daughter.   Acute renal failure: Resolved.  Admitted from 9/2-9/7 with altered mental status secondary to acute kidney injury.  Creatinine on admit was 6.7 with a previous baseline of 1.3-1.6 and potassium of 5.4.  Due to presentation she was initially admitted to the ICU, however transferred out to our family medicine teaching service.  Her home ramipril was discontinued.  Received fluid resuscitation and creatinine went to 1.02 on day of discharge.  AMS resolved with resolution of kidney injury.  T2DM: Hypoglycemic to the 40s on admit.  A1c 12.9 on 02/04/2020.  Home dapagliflozin and glimepiride were discontinued.  Started on Lantus 15 units and discharged home with this w/ metformin 500 BID. Glucose has been ranging fasting 120-200, nighttime usually 150-200. No lows.   Hypertension: Home medications were hydralazine and Coreg, however hydralazine was held on DC due to initial admitted hypotension. No cuff at home, but daughter was going to buy one, not sure how to use it. Denies any lightheadedness/dizziness, SOB, CP, HA.   Goals of care: Change status to DNR and will be working with palliative care outpatient.   PERTINENT  PMH / PSH: Atrial fibrillation, CHF, CAD, hypertension, hypothyroidism, history of CVA, seizure disorder, renal failure  OBJECTIVE:   BP 132/80   Pulse 86   Ht 5' (1.524 m)   Wt 153 lb 3.2 oz (69.5 kg)   SpO2 94%   BMI 29.92 kg/m   General: Alert, NAD HEENT: NCAT, MMM   Cardiac: Regular rate and rhythm today without significant murmur appreciated  Lungs: Clear bilaterally, no increased WOB on RA Abdomen: soft Neuro: Alert and oriented, answers questions appropriately, smile symmetrical, moving all  extremities spontaneously  Ext: Warm, dry, 2+ radial pulses, trace edema   ASSESSMENT/PLAN:   Renal failure Resolved in the setting of ACE inhibitor and likely dehydration, hospital follow-up today as discussed above. Cr at baseline prior to hospital D/C, will recheck today.  Type 2 diabetes mellitus without complications (HCC) Poorly controlled, A1c 12.9 in 01/2020.  Significant hypoglycemia during hospitalization with renal failure, thus regimen was transitioned.  Compliant with Lantus 15U and Metformin 500 mg BID with improved glycemia, however room to improve.  Will increase Metformin to 1000 mg BID in stepwise fashion. Cont lantus as is given several fasting readings at 120, would like to avoid lows for her.  Podiatry referral placed per patient/daughter request for routine diabetic foot care and onychogryphosis.   Essential hypertension Reasonable control, 132/80 today.  Cont Coreg as is, still hold hydralazine.  Daughter to get BP cuff at home and bring in for nurses visit to learn to take BP.  Keep journal of BP 2-3 times weekly and bring in on next visit.  History of vitamin D deficiency Check level today.     Follow-up in 1 month for the above with new PCP Dr. Pecola Leisure or sooner if needed.  Allayne Stack, DO  [facility]

## 2020-03-25 NOTE — Patient Instructions (Signed)
It was wonderful seeing you today.  We are checking your kidneys and blood level today, that should be back in the next few days.  I will either send a letter or give you a call.  For her diabetes: Please increase her Metformin to 2 tablets in the morning (at 1000 mg), and continue 1 tablet at night for the next 1 week.  If she is tolerating this well then at that time you can increase it to 2 tablets in the morning and 2 tablets at night.  Please continue to check her fasting sugar and then 2 hours after her largest meal.  Keep a journal of these numbers to bring into her next visit so that we can help adjust things as appropriate.  For her high blood pressure: Continue the Coreg.  Make sure that you buy a blood pressure cuff and then schedule a nurses visit to teach how to use this.  A reasonable goal for her would be a top number under 140 or 130 and the bottom number under 90.  So optimal would be around or below 130/80.  Please follow-up in a few weeks (3-4) with Dr. Philip Aspen for blood pressure and diabetes control.

## 2020-03-26 ENCOUNTER — Telehealth: Payer: Self-pay

## 2020-03-26 LAB — BASIC METABOLIC PANEL
BUN/Creatinine Ratio: 13 (ref 12–28)
BUN: 10 mg/dL (ref 8–27)
CO2: 24 mmol/L (ref 20–29)
Calcium: 9.5 mg/dL (ref 8.7–10.3)
Chloride: 104 mmol/L (ref 96–106)
Creatinine, Ser: 0.76 mg/dL (ref 0.57–1.00)
GFR calc Af Amer: 96 mL/min/{1.73_m2} (ref >59)
GFR calc non Af Amer: 83 mL/min/{1.73_m2} (ref >59)
Glucose: 132 mg/dL — ABNORMAL HIGH (ref 65–99)
Potassium: 4.3 mmol/L (ref 3.5–5.2)
Sodium: 147 mmol/L — ABNORMAL HIGH (ref 134–144)

## 2020-03-26 LAB — CBC WITH DIFFERENTIAL/PLATELET
Basophils Absolute: 0 10*3/uL (ref 0.0–0.2)
Basos: 0 %
EOS (ABSOLUTE): 0.3 10*3/uL (ref 0.0–0.4)
Eos: 3 %
Hematocrit: 35.4 % (ref 34.0–46.6)
Hemoglobin: 11.4 g/dL (ref 11.1–15.9)
Immature Grans (Abs): 0 10*3/uL (ref 0.0–0.1)
Immature Granulocytes: 0 %
Lymphocytes Absolute: 2.4 10*3/uL (ref 0.7–3.1)
Lymphs: 24 %
MCH: 28.7 pg (ref 26.6–33.0)
MCHC: 32.2 g/dL (ref 31.5–35.7)
MCV: 89 fL (ref 79–97)
Monocytes Absolute: 1 10*3/uL — ABNORMAL HIGH (ref 0.1–0.9)
Monocytes: 10 %
Neutrophils Absolute: 6.3 10*3/uL (ref 1.4–7.0)
Neutrophils: 63 %
Platelets: 276 10*3/uL (ref 150–450)
RBC: 3.97 x10E6/uL (ref 3.77–5.28)
RDW: 12.7 % (ref 11.7–15.4)
WBC: 10 10*3/uL (ref 3.4–10.8)

## 2020-03-26 LAB — VITAMIN D 25 HYDROXY (VIT D DEFICIENCY, FRACTURES): Vit D, 25-Hydroxy: 75.5 ng/mL (ref 30.0–100.0)

## 2020-03-26 NOTE — Telephone Encounter (Signed)
Brandon from [facility] calling for OT verbal orders as follows:  1 time(s) weekly for 1 week(s), then 2 time(s) weekly for 3 week(s), then 1 time(s) weekly for 1 week(s).   Verbal orders given per [facility] protocol.   Veronda Prude, RN

## 2020-03-27 ENCOUNTER — Encounter: Payer: Self-pay | Admitting: Family Medicine

## 2020-03-27 DIAGNOSIS — E119 Type 2 diabetes mellitus without complications: Secondary | ICD-10-CM | POA: Insufficient documentation

## 2020-03-27 DIAGNOSIS — Z8639 Personal history of other endocrine, nutritional and metabolic disease: Secondary | ICD-10-CM | POA: Insufficient documentation

## 2020-03-27 NOTE — Assessment & Plan Note (Signed)
Resolved in the setting of ACE inhibitor and likely dehydration, hospital follow-up today as discussed above. Cr at baseline prior to hospital D/C, will recheck today.

## 2020-03-27 NOTE — Assessment & Plan Note (Signed)
Check level today 

## 2020-03-27 NOTE — Assessment & Plan Note (Addendum)
Poorly controlled, A1c 12.9 in 01/2020.  Significant hypoglycemia during hospitalization with renal failure, thus regimen was transitioned.  Compliant with Lantus 15U and Metformin 500 mg BID with improved glycemia, however room to improve.  Will increase Metformin to 1000 mg BID in stepwise fashion. Cont lantus as is given several fasting readings at 120, would like to avoid lows for her.  Podiatry referral placed per patient/daughter request for routine diabetic foot care and onychogryphosis.

## 2020-03-27 NOTE — Assessment & Plan Note (Signed)
Reasonable control, 132/80 today.  Cont Coreg as is, still hold hydralazine.  Daughter to get BP cuff at home and bring in for nurses visit to learn to take BP.  Keep journal of BP 2-3 times weekly and bring in on next visit.

## 2020-03-29 ENCOUNTER — Ambulatory Visit (INDEPENDENT_AMBULATORY_CARE_PROVIDER_SITE_OTHER): Payer: Medicare Other | Admitting: Diagnostic Neuroimaging

## 2020-03-29 ENCOUNTER — Encounter: Payer: Self-pay | Admitting: Diagnostic Neuroimaging

## 2020-03-29 ENCOUNTER — Other Ambulatory Visit: Payer: Self-pay

## 2020-03-29 VITALS — BP 143/95 | HR 81 | Ht 60.0 in | Wt 149.0 lb

## 2020-03-29 DIAGNOSIS — R569 Unspecified convulsions: Secondary | ICD-10-CM

## 2020-03-29 MED ORDER — LEVETIRACETAM ER 750 MG PO TB24: 750.0000 mg | ORAL_TABLET | Freq: Two times a day (BID) | ORAL | 4 refills | Status: AC

## 2020-03-29 NOTE — Patient Instructions (Signed)
  RECURRENT SEIZURES (since 02/03/20; 3 events; last on 03/09/20) - continue levetiracetam 750mg  twice a day   - no driving x at least 6 months, however patient stopped driving years ago and does not plan to return to driving in the future  - Please maintain precautions. Do not participate in activities where a loss of awareness could harm you or someone else. No swimming alone, no tub bathing, no hot tubs, no driving, no operating motorized vehicles (cars, ATVs, motocycles, etc), lawnmowers, power tools or firearms. No standing at heights, such as rooftops, ladders or stairs. Avoid hot objects such as stoves, heaters, open fires. Wear a helmet when riding a bicycle, scooter, skateboard, etc. and avoid areas of traffic. Set your water heater to 120 degrees or less.

## 2020-03-29 NOTE — Progress Notes (Signed)
[facility]  PATIENT: Nichole [name] DOB: Jan 13, 1956  REFERRING CLINICIAN: No ref. provider found HISTORY FROM: Patient and daughter REASON FOR VISIT: New consult   HISTORICAL  CHIEF COMPLAINT:  Chief Complaint  Patient presents with  . New Patient (Initial Visit)    pt with daughter rm 13. suddenly had onset of sz in july. she continues to have sz. last Sz was sat,8/28.     HISTORY OF PRESENT ILLNESS:   64 year old female with history of asthma, atrial fibrillation on anticoagulation, CHF, coronary artery disease, hypertension, hyperlipidemia, stroke, here for evaluation of seizures.  Patient moved to [facility] in July 2021.  On 02/03/2020 patient had episode of staring, drooling, incontinence, unresponsiveness.  Patient was taken to hospital for evaluation.  She was diagnosed with new onset seizures.  She started on antiseizure medication levetiracetam 500mg  twice a day.  Patient had another seizure on 02/13/2020 and levetiracetam was increased to 750 mg twice a day.  She had a third seizure on 03/09/2020, possible related to missed medications and therefore antiseizure dose was unchanged.  On March 14, 2020 patient had another episode of altered mental status, felt to be related to dehydration and other medical factors.  Today patient feels at baseline.  She is tolerating medication.  She does report remote history of stroke affecting left brain and right side of body.  This was around 2011.  No family history of seizures.    REVIEW OF SYSTEMS: Full 14 system review of systems performed and negative with exception of: As per HPI.  ALLERGIES: No Known Allergies  HOME MEDICATIONS: Outpatient Medications Prior to Visit  Medication Sig Dispense Refill  . apixaban (ELIQUIS) 5 MG TABS tablet Take 1 tablet (5 mg total) by mouth 2 (two) times daily. 60 tablet 0  . carvedilol (COREG) 12.5 MG tablet Take 1 tablet (12.5 mg total) by mouth in the morning and at  bedtime. 60 tablet 1  . Cholecalciferol (VITAMIN D3) 1.25 MG (50000 UT) CAPS Take 50,000 Units by mouth every Friday.    . furosemide (LASIX) 40 MG tablet Take 1 tablet (40 mg total) by mouth as needed for fluid or edema (Use when swelling worsening or gain more than 5 lbs in 1 week.). 30 tablet 0  . insulin glargine (LANTUS SOLOSTAR) 100 UNIT/ML Solostar Pen Inject 15 Units into the skin at bedtime. 15 mL 11  . Levetiracetam 750 MG TB24 Take 1 tablet (750 mg total) by mouth 2 (two) times daily. 60 tablet 1  . levothyroxine (SYNTHROID) 125 MCG tablet Take 1 tablet (125 mcg total) by mouth every morning. 30 tablet 1  . metFORMIN (GLUCOPHAGE) 500 MG tablet Take 1 tablet (500 mg total) by mouth 2 (two) times daily with a meal. 60 tablet 0  . mirabegron ER (MYRBETRIQ) 25 MG TB24 tablet Take 25 mg by mouth daily.    Tuesday atorvastatin (LIPITOR) 20 MG tablet Take 1 tablet (20 mg total) by mouth daily. 30 tablet 0   No facility-administered medications prior to visit.    PAST MEDICAL HISTORY: Past Medical History:  Diagnosis Date  . CHF (congestive heart failure) (HCC)   . Diabetes mellitus without complication (HCC)   . Elevated lactic acid level   . Hypertension   . Hypothyroidism   . Paroxysmal atrial fibrillation (HCC)   . Seizures (HCC)     PAST SURGICAL HISTORY: Past Surgical History:  Procedure Laterality Date  . PACEMAKER IMPLANT      FAMILY HISTORY:  No family history on file.  SOCIAL HISTORY: Social History   Socioeconomic History  . Marital status: Widowed    Spouse name: Not on file  . Number of children: Not on file  . Years of education: Not on file  . Highest education level: Not on file  Occupational History  . Not on file  Tobacco Use  . Smoking status: Former Smoker    Packs/day: 0.25    Quit date: 02/06/1999    Years since quitting: 21.1  . Smokeless tobacco: Never Used  Vaping Use  . Vaping Use: Never used  Substance and Sexual Activity  . Alcohol use:  Never  . Drug use: Never  . Sexual activity: Not on file  Other Topics Concern  . Not on file  Social History Narrative  . Not on file   Social Determinants of Health   Financial Resource Strain:   . Difficulty of Paying Living Expenses: Not on file  Food Insecurity:   . Worried About Programme researcher, broadcasting/film/video in the Last Year: Not on file  . Ran Out of Food in the Last Year: Not on file  Transportation Needs:   . Lack of Transportation (Medical): Not on file  . Lack of Transportation (Non-Medical): Not on file  Physical Activity:   . Days of Exercise per Week: Not on file  . Minutes of Exercise per Session: Not on file  Stress:   . Feeling of Stress : Not on file  Social Connections:   . Frequency of Communication with Friends and Family: Not on file  . Frequency of Social Gatherings with Friends and Family: Not on file  . Attends Religious Services: Not on file  . Active Member of Clubs or Organizations: Not on file  . Attends Banker Meetings: Not on file  . Marital Status: Not on file  Intimate Partner Violence:   . Fear of Current or Ex-Partner: Not on file  . Emotionally Abused: Not on file  . Physically Abused: Not on file  . Sexually Abused: Not on file     PHYSICAL EXAM  GENERAL EXAM/CONSTITUTIONAL: Vitals:  Vitals:   03/29/20 0906  BP: (!) 143/95  Pulse: 81  Weight: 149 lb (67.6 kg)  Height: 5' (1.524 m)     Body mass index is 29.1 kg/m. Wt Readings from Last 3 Encounters:  03/29/20 149 lb (67.6 kg)  03/25/20 153 lb 3.2 oz (69.5 kg)  03/19/20 153 lb 14.4 oz (69.8 kg)     Patient is in no distress; well developed, nourished and groomed; neck is supple  CARDIOVASCULAR:  Examination of carotid arteries is normal; no carotid bruits  Regular rate and rhythm, no murmurs  Examination of peripheral vascular system by observation and palpation is normal  EYES:  Ophthalmoscopic exam of optic discs and posterior segments is normal; no  papilledema or hemorrhages  No exam data present  MUSCULOSKELETAL:  Gait, strength, tone, movements noted in Neurologic exam below  NEUROLOGIC: MENTAL STATUS:  No flowsheet data found.  awake, alert, oriented to person, place and time  recent and remote memory intact  normal attention and concentration  language fluent, comprehension intact, naming intact  fund of knowledge appropriate  CRANIAL NERVE:   2nd - no papilledema on fundoscopic exam  2nd, 3rd, 4th, 6th - pupils equal and reactive to light, visual fields full to confrontation, extraocular muscles intact, no nystagmus  5th - facial sensation symmetric  7th - facial strength symmetric  8th -  hearing intact  9th - palate elevates symmetrically, uvula midline  11th - shoulder shrug symmetric  12th - tongue protrusion midline  MOTOR:   normal bulk and tone, full strength in the BUE, BLE  SENSORY:   normal and symmetric to light touch, temperature, vibration  COORDINATION:   finger-nose-finger, fine finger movements normal  REFLEXES:   deep tendon reflexes present and symmetric  GAIT/STATION:   narrow based gait; SLOW CAUTIOUS GAIT     DIAGNOSTIC DATA (LABS, IMAGING, TESTING) - I reviewed patient records, labs, notes, testing and imaging myself where available.  Lab Results  Component Value Date   WBC 10.0 03/25/2020   HGB 11.4 03/25/2020   HCT 35.4 03/25/2020   MCV 89 03/25/2020   PLT 276 03/25/2020      Component Value Date/Time   NA 147 (H) 03/25/2020 1016   K 4.3 03/25/2020 1016   CL 104 03/25/2020 1016   CO2 24 03/25/2020 1016   GLUCOSE 132 (H) 03/25/2020 1016   GLUCOSE 164 (H) 03/19/2020 0716   BUN 10 03/25/2020 1016   CREATININE 0.76 03/25/2020 1016   CALCIUM 9.5 03/25/2020 1016   PROT 7.8 03/14/2020 1745   ALBUMIN 3.3 (L) 03/19/2020 0716   AST 18 03/14/2020 1745   ALT 21 03/14/2020 1745   ALKPHOS 41 03/14/2020 1745   BILITOT 0.5 03/14/2020 1745   GFRNONAA 83  03/25/2020 1016   GFRAA 96 03/25/2020 1016   Lab Results  Component Value Date   CHOL 194 02/05/2020   HDL 47 02/05/2020   LDLCALC 120 (H) 02/05/2020   TRIG 135 02/05/2020   CHOLHDL 4.1 02/05/2020   Lab Results  Component Value Date   HGBA1C 12.9 (H) 02/04/2020   No results found for: [id number] Lab Results  Component Value Date   TSH 2.366 03/17/2020    02/04/20 EEG - This study is within normal limits. No seizures or epileptiform discharges were seen throughout the recording.  03/14/20 CT head  1. Stable chronic ischemic changes as above. No acute intracranial process.    ASSESSMENT AND PLAN  64 y.o. year old female here with:  Dx:  1. 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 Do not participate in activities where a loss of awareness could harm you or someone else. No swimming alone, no tub bathing, no hot tubs, no driving, no operating motorized vehicles (cars, ATVs, motocycles, etc), lawnmowers, power tools or firearms. No standing at heights, such as rooftops, ladders or stairs. Avoid hot objects such as stoves, heaters, open fires. Wear a helmet when riding a bicycle, scooter, skateboard, etc. and avoid areas of traffic. Set your water heater to 120 degrees or less.   Meds ordered this encounter  Medications  . Levetiracetam 750 MG TB24    Sig: Take 1 tablet (750 mg total) by mouth 2 (two) times daily.    Dispense:  180 tablet    Refill:  4   Return in about 1 year (around 03/29/2021) for with NP (Amy Lomax), virtual visit (15 min).    03/31/2021, MD 03/29/2020, 9:28 AM Certified in Neurology, Neurophysiology and Neuroimaging  [facility] [address] [place], [place]  [place] [phone]

## 2020-04-01 ENCOUNTER — Encounter: Payer: Self-pay | Admitting: Family Medicine

## 2020-04-09 ENCOUNTER — Telehealth: Payer: Self-pay

## 2020-04-09 NOTE — Telephone Encounter (Signed)
Brandon from [employer] calling for OT verbal orders as follows:  1 time(s) weekly for 2 week(s) Verbal orders given per [facility] protocol  *Orders had to be updated due to insurance coverage.    Veronda Prude, RN

## 2020-04-12 ENCOUNTER — Ambulatory Visit (INDEPENDENT_AMBULATORY_CARE_PROVIDER_SITE_OTHER): Payer: Medicare Other | Admitting: Podiatry

## 2020-04-12 ENCOUNTER — Other Ambulatory Visit: Payer: Self-pay

## 2020-04-12 DIAGNOSIS — E1169 Type 2 diabetes mellitus with other specified complication: Secondary | ICD-10-CM | POA: Diagnosis not present

## 2020-04-12 DIAGNOSIS — E1142 Type 2 diabetes mellitus with diabetic polyneuropathy: Secondary | ICD-10-CM

## 2020-04-12 DIAGNOSIS — L84 Corns and callosities: Secondary | ICD-10-CM | POA: Diagnosis not present

## 2020-04-12 DIAGNOSIS — B351 Tinea unguium: Secondary | ICD-10-CM | POA: Diagnosis not present

## 2020-04-12 NOTE — Progress Notes (Signed)
  Subjective:  Patient ID: Nichole [name], female    DOB: 10-13-1955,  MRN: [id number]  Chief Complaint  Patient presents with  . Nail Problem    Nail trim 1-5 bilateral  . Callouses    Bilateral callous trim  . Diabetes Mellitus    Diabetic foot exam   64 y.o. female presents with the above complaint. History confirmed with patient. Denies numbness to her feet  Objective:  Physical Exam: warm, good capillary refill, nail exam onychomycosis of the toenails, no trophic changes or ulcerative lesions. DP pulses palpable, PT pulses palpable, epicritic sensation to light touch intact and protective sensation absent Left Foot: Pinch callus, HPK submet 1st  Right Foot: Pinch callus HPK submet 1/5   No images are attached to the encounter.  Assessment:   1. 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 DM type 2 with diabetic peripheral neuropathy (HCC)   3. Callus of foot    Plan:  Patient was evaluated and treated and all questions answered.  Onychomycosis, Diabetes and DPN -Patient is diabetic with a qualifying condition for at risk foot care.   Procedure: Nail Debridement Rationale: Patient meets criteria for routine foot care due to DPN Type of Debridement: manual, sharp debridement. Instrumentation: Nail nipper, rotary burr. Number of Nails: 10  Procedure: Paring of Lesion Rationale: painful hyperkeratotic lesion Type of Debridement: manual, sharp debridement. Instrumentation: 312 blade Number of Lesions: 3  Return in about 3 months (around 07/13/2020) for Diabetic Foot Care.

## 2020-04-15 ENCOUNTER — Telehealth: Payer: Self-pay

## 2020-04-15 NOTE — Telephone Encounter (Signed)
Nichole [name] from [facility] calling for PT verbal orders as follows:  2 time(s) weekly for 2 week(s), then 1 time(s) weekly for 3 week(s)  Verbal orders given per [facility] protocol  Veronda Prude, RN

## 2020-04-17 ENCOUNTER — Telehealth: Payer: Self-pay | Admitting: Internal Medicine

## 2020-04-17 NOTE — Telephone Encounter (Signed)
Spoke with patient's daughter, Essie Christine, regarding the Palliative referral and discussed Palliative services with her and she was in agreement with scheduling visit.  I have scheduled an In-person Consult for 04/26/20 @ 11 AM.

## 2020-04-18 ENCOUNTER — Telehealth: Payer: Self-pay | Admitting: Family Medicine

## 2020-04-18 ENCOUNTER — Other Ambulatory Visit: Payer: Self-pay | Admitting: Family Medicine

## 2020-04-18 DIAGNOSIS — N3281 Overactive bladder: Secondary | ICD-10-CM

## 2020-04-18 MED ORDER — MIRABEGRON ER 25 MG PO TB24: 25.0000 mg | ORAL_TABLET | Freq: Every day | ORAL | 0 refills | Status: DC

## 2020-04-18 NOTE — Telephone Encounter (Signed)
Patient's daughter is called and scheduled patient's follow up visit with Dr. Pecola Leisure.  Patient's daughter also states her mother is needing a refill on Myrbetriq 25mg , due to [place] last [place] a different [place]. Thanks

## 2020-04-18 NOTE — Progress Notes (Signed)
Refills provided for patient's Myrbetriq 25mg .

## 2020-04-25 ENCOUNTER — Other Ambulatory Visit: Payer: Self-pay

## 2020-04-25 ENCOUNTER — Ambulatory Visit (INDEPENDENT_AMBULATORY_CARE_PROVIDER_SITE_OTHER): Payer: Medicare Other | Admitting: Family Medicine

## 2020-04-25 ENCOUNTER — Encounter: Payer: Self-pay | Admitting: Family Medicine

## 2020-04-25 VITALS — BP 110/70 | HR 70 | Wt 140.6 lb

## 2020-04-25 DIAGNOSIS — E1169 Type 2 diabetes mellitus with other specified complication: Secondary | ICD-10-CM | POA: Diagnosis not present

## 2020-04-25 DIAGNOSIS — R079 Chest pain, unspecified: Secondary | ICD-10-CM

## 2020-04-25 DIAGNOSIS — I10 Essential (primary) hypertension: Secondary | ICD-10-CM

## 2020-04-25 DIAGNOSIS — E119 Type 2 diabetes mellitus without complications: Secondary | ICD-10-CM | POA: Diagnosis not present

## 2020-04-25 LAB — POCT GLYCOSYLATED HEMOGLOBIN (HGB A1C): HbA1c, POC (controlled diabetic range): 8.3 % — AB (ref 0.0–7.0)

## 2020-04-25 MED ORDER — APIXABAN 5 MG PO TABS
5.0000 mg | ORAL_TABLET | Freq: Two times a day (BID) | ORAL | 0 refills | Status: DC
Start: 2020-04-25 — End: 2020-05-16

## 2020-04-25 MED ORDER — ATORVASTATIN CALCIUM 20 MG PO TABS: 20.0000 mg | ORAL_TABLET | Freq: Every day | ORAL | 0 refills | Status: DC

## 2020-04-25 MED ORDER — IBUPROFEN 200 MG PO TABS: 200.0000 mg | ORAL_TABLET | Freq: Four times a day (QID) | ORAL | Status: DC | PRN

## 2020-04-25 MED ORDER — CETIRIZINE HCL 10 MG PO TABS: 10.0000 mg | ORAL_TABLET | Freq: Every day | ORAL | 11 refills | Status: AC

## 2020-04-25 NOTE — Progress Notes (Signed)
    SUBJECTIVE:   CHIEF COMPLAINT / HPI: BP/Diabetes f/u  Nichole [name] is here with daughter for BP/Diabetes f/u.  Also has complaints of cough and urinary incontinence.  Indicates cough has had moderate production of sputum.  Indicates is clear/yellowish.  Denies any fevers, difficulty breathing, sinus tenderness or congestion.  Patient is not a smoker.  Has been going on for about 1 month.  Patient daughter also concerned about bladder/bowel incontinence.  Has no unintentional weight loss, lower extremity weakness, back pain or night sweats.  Has had issues with this in past and is currently on Myrbetric 25 mg.  Patient complaining of localized chest pain at localized site on chest where pacemaker was implanted.  Tylenol has not provided relief. Patient has upcoming appointment with Cardiology in November.  Patient also has appointemnt with hospice later this week.  PERTINENT  PMH / PSH: HTN,   OBJECTIVE:   BP 110/70   Pulse 70   Wt 140 lb 9.6 oz (63.8 kg)   SpO2 98%   BMI 27.46 kg/m    Physical Exam Constitutional:      General: She is not in acute distress.    Appearance: Normal appearance. She is not ill-appearing.  HENT:     Head: Normocephalic and atraumatic.  Cardiovascular:     Rate and Rhythm: Normal rate and regular rhythm.     Pulses: Normal pulses.  Pulmonary:     Effort: Pulmonary effort is normal.     Breath sounds: Normal breath sounds.  Musculoskeletal:        General: Tenderness present. No swelling or deformity.     Comments: Mild chest wall tenderness at pacemaker site  Skin:    General: Skin is warm.     Capillary Refill: Capillary refill takes less than 2 seconds.  Neurological:     General: No focal deficit present.     Mental Status: She is alert.  Psychiatric:        Mood and Affect: Mood normal.        Behavior: Behavior normal.     ASSESSMENT/PLAN:   Essential hypertension BP well controlled today at 110/70.   - Continue CoReg 12.5 mg  BID, will not restart hydralazine at this time   Type 2 diabetes mellitus without complications (HCC) A1C 8.3 today down from 12.9 two months ago.  Praised patient for good effort and medication compliance.   - Continue Metformin 1000 mg BID and Lantus 15U qd   Cough Likely allergic reaction or post-viral cough.  Has been going on for 1 month now.  Clear sputum production.  Also some congestion.  Less likely COPD given patient's lack of smoking.  No fever or other concerning findings for bacterial pneumonia.  Chest X-Ray normal last month.  Do not feel further imaging or testing is indicated at this time. - Prescribe Certrizine 10mg  qd for symptom relief - F/u in 1 month to see if improved  Urinary Incontinence Currently on Myrbetriq 25mg  daily.   - Continue myrbetric - Gave patient handout with bladder training instructions - f/u in 1 month  Chest Pain Tenderness at pacemaker implant site.  Tylenol has not provided relief.   - Can try Ibuprofen 200mg  q6hr PRN - f/u with Cardiolodist in 2 weeks  , MD [facility]

## 2020-04-25 NOTE — Patient Instructions (Addendum)
It was good to see you today.  Thank you for coming in.  Your blood pressure is well controlled and your diabetes is improving.  I am reccomending taking Zyrtec for you congestion and ibuprofen 200 mg for chest pain.  I am also attaching as list of directions to help with bladder training.  Please follow up in 1 month.  Be Well, Dr Pecola Leisure   1. Start by going to the toilet and trying to urinate as often as your shortest voiding interval (the length of time between trips to the bathroom) based on your voiding diary. For example, go everyhour if that is what your bladder diary indicates is the shortest interval of time between visits to urinate. Make these regular trips to the toilet while you are awake. You do not have to get up during the night!  2. You must try to urinate whether you feel the need or not. You must try to urinate even if you have just been incontinent.  3. If you get a strong urge to go to the bathroom before your scheduled time, use distraction or relaxation: 0 Stop, do not run to the bathroom! 0 Stand still or sit down if you can. 0 RELAX. Take a deep breath and let it out slowly. 0 Concentrate on making the urge decrease or even go away anyway you can (imagine the pressure becoming less and less). You can also try doing quick contractions of your pelvic floor muscles. 0 DISTRACT yourself, for example by doing math problems in your head. 0 When you feel IN CONTROL OF YOUR BLADDER, walk slowly to the bathroom, and then go.  4. Keep this schedule until you can go1 day without urine leakage. Then, increase the time between scheduled trips to the toilet by 15 minutes. When you can go 1day on this new schedule without urine leakage, extend the time between bathroom trips again by 15 minutes.  5. Keep this up until you can go 4hours between trips to the toilet (which is NORMAL), or until you are comfortable with a shorter time interval. This may take several weeks.  6. DO NOT  GET DISCOURAGED! Bladder training takes time and effort, but it is an effective way to get rid of incontinence without medication or surgery.

## 2020-04-26 ENCOUNTER — Other Ambulatory Visit: Payer: Medicare Other | Admitting: Internal Medicine

## 2020-04-29 NOTE — Assessment & Plan Note (Signed)
A1C 8.3 today down from 12.9 two months ago.  Praised patient for good effort and medication compliance.   - Continue Metformin 1000 mg BID and Lantus 15U qd

## 2020-04-29 NOTE — Assessment & Plan Note (Signed)
BP well controlled today at 110/70.   - Continue CoReg 12.5 mg BID, will not restart hydralazine at this time

## 2020-05-09 ENCOUNTER — Other Ambulatory Visit: Payer: Medicare Other | Admitting: Internal Medicine

## 2020-05-09 DIAGNOSIS — Z7189 Other specified counseling: Secondary | ICD-10-CM

## 2020-05-09 DIAGNOSIS — Z515 Encounter for palliative care: Secondary | ICD-10-CM

## 2020-05-09 NOTE — Progress Notes (Signed)
Therapist, nutritional Palliative Care Consult Note Telephone: [phone]  Fax: [phone]  PATIENT NAME: Nichole [name] DOB: 07/30/1955 MRN: [id number]  PRIMARY CARE PROVIDER:   Jovita Kussmaul, MD  REFERRING PROVIDER:  Jovita Kussmaul, MD [address] [place],  [place] [address]  RESPONSIBLE PARTYEssie Christine  Daughter  [phone]     RECOMMENDATIONS and PLAN: Palliative care encounter  Z51.5  1.  Advance care planning:  Explanation of palliative care to patient and her daughter. Primary goal as stated by both parties is for patient to relocate to [place] to live with her son.  She will depart on 05/23/20.  Advanced directives were completed during hospitalization with delegations of DNAR, do not return to the hospital and all comfort measures.  A duplicate golden rod was completed and given to patient for home display.  Pt confirmed that those delegations are still her choices.   Encouraged to establish with a PCP, cardiologist and neurologist in [place].  Daughter and patient declined any follow-up from palliative for the remainder of time that she is in [place] but plan to establish also after relocation.    I spent 40 minutes providing this consultation,  from 0900 to 0940. More than 50% of the time in this consultation was spent coordinating communication with patient and daughter.Marland Kitchen   HISTORY OF PRESENT ILLNESS:  Nichole [name] is a 64 y.o. year old female with multiple medical problems including CHF, afib with pacemaker, T2DM and seizure disorder.  She has had multiple hospitiliztions for varied reasons per medical records. Daughter denies any seizure activities since last admission. She requires some assistance with ADLs, medications and meals. She is continent of [place].   Palliative Care was asked to help address goals of care.   CODE STATUS: DNAR/DNI  PPS: 50% weak HOSPICE ELIGIBILITY/DIAGNOSIS: TBD  PAST MEDICAL HISTORY:  Past Medical History:    Diagnosis Date   CHF (congestive heart failure) (HCC)    Diabetes mellitus without complication (HCC)    Elevated lactic acid level    Hypertension    Hypothyroidism    Paroxysmal atrial fibrillation (HCC)    Seizures (HCC)     SOCIAL HX: Currently lives with daughter Social History   Tobacco Use   Smoking status: Former Smoker    Packs/day: 0.25    Quit date: 02/06/1999    Years since quitting: 21.2   Smokeless tobacco: Never Used  Substance Use Topics   Alcohol use: Never    ALLERGIES: No Known Allergies   PERTINENT MEDICATIONS:  Outpatient Encounter Medications as of 05/09/2020  Medication Sig   apixaban (ELIQUIS) 5 MG TABS tablet Take 1 tablet (5 mg total) by mouth 2 (two) times daily.   atorvastatin (LIPITOR) 20 MG tablet Take 1 tablet (20 mg total) by mouth daily.   carvedilol (COREG) 12.5 MG tablet Take 1 tablet (12.5 mg total) by mouth in the morning and at bedtime.   cetirizine (ZYRTEC) 10 MG tablet Take 1 tablet (10 mg total) by mouth daily.   Cholecalciferol (VITAMIN D3) 1.25 MG (50000 UT) CAPS Take 50,000 Units by mouth every Friday.   furosemide (LASIX) 40 MG tablet Take 1 tablet (40 mg total) by mouth as needed for fluid or edema (Use when swelling worsening or gain more than 5 lbs in 1 week.).   insulin glargine (LANTUS SOLOSTAR) 100 UNIT/ML Solostar Pen Inject 15 Units into the skin at bedtime.   Levetiracetam 750 MG TB24 Take 1 tablet (750 mg total)  by mouth 2 (two) times daily.   levothyroxine (SYNTHROID) 125 MCG tablet Take 1 tablet (125 mcg total) by mouth every morning.   metFORMIN (GLUCOPHAGE) 500 MG tablet Take 1 tablet (500 mg total) by mouth 2 (two) times daily with a meal.   mirabegron ER (MYRBETRIQ) 25 MG TB24 tablet Take 1 tablet (25 mg total) by mouth daily.   No facility-administered encounter medications on file as of 05/09/2020.    PHYSICAL EXAM:   General: NAD, chronically ill appearing Pulmonary: unlabored  respirations Extremities: no edema.  Ambulates without assistance Skin:expose skin is intact Neurological: A&Ox 3.  Margaretha Sheffield, NP-C

## 2020-05-10 ENCOUNTER — Other Ambulatory Visit: Payer: Self-pay

## 2020-05-16 ENCOUNTER — Ambulatory Visit (INDEPENDENT_AMBULATORY_CARE_PROVIDER_SITE_OTHER): Payer: Medicare Other | Admitting: Pharmacist

## 2020-05-16 ENCOUNTER — Other Ambulatory Visit: Payer: Self-pay

## 2020-05-16 VITALS — BP 144/82 | HR 84 | Resp 15 | Ht 60.0 in | Wt 147.8 lb

## 2020-05-16 DIAGNOSIS — E1169 Type 2 diabetes mellitus with other specified complication: Secondary | ICD-10-CM | POA: Diagnosis not present

## 2020-05-16 DIAGNOSIS — Z8673 Personal history of transient ischemic attack (TIA), and cerebral infarction without residual deficits: Secondary | ICD-10-CM

## 2020-05-16 DIAGNOSIS — I10 Essential (primary) hypertension: Secondary | ICD-10-CM

## 2020-05-16 DIAGNOSIS — I5022 Chronic systolic (congestive) heart failure: Secondary | ICD-10-CM | POA: Diagnosis not present

## 2020-05-16 DIAGNOSIS — I2510 Atherosclerotic heart disease of native coronary artery without angina pectoris: Secondary | ICD-10-CM

## 2020-05-16 MED ORDER — ATORVASTATIN CALCIUM 20 MG PO TABS: 20.0000 mg | ORAL_TABLET | Freq: Every day | ORAL | 0 refills | Status: DC

## 2020-05-16 MED ORDER — CARVEDILOL 12.5 MG PO TABS: 12.5000 mg | ORAL_TABLET | Freq: Two times a day (BID) | ORAL | 1 refills | Status: AC

## 2020-05-16 MED ORDER — APIXABAN 5 MG PO TABS: 5.0000 mg | ORAL_TABLET | Freq: Two times a day (BID) | ORAL | 0 refills | Status: AC

## 2020-05-16 MED ORDER — METFORMIN HCL 500 MG PO TABS: 500.0000 mg | ORAL_TABLET | Freq: Two times a day (BID) | ORAL | 0 refills | Status: AC

## 2020-05-16 NOTE — Progress Notes (Signed)
Patient ID: Nichole [name]                 DOB: Jan 16, 1956                      MRN: [id number]     HPI: Nichole [name] is a 64 y.o. female referred by Dr. Jacques Navy to HTN clinic. PMH is significant for A Fib, CHF (EF 35-40%), CAD, HTN, CKD, seizure disorder, and DM.  Patient was recently hospitalized for acute renal failure and hypoglycemia.  Kidney function recovered however patient requested to be DNR and is now seeing palliative care as an outpatient.  Ramipril and Farxiga discontinued in hospital because they may have been contributory.  Palliative care had first visit with patient on 10/28 and patient remains DNR.  Plans on moving to [place] on 05/23/20. Does not have PCP, cardiologist, or neuorologist in [place] yet.  Patient presents today with daughter. Poor historian but reports she feels well. Reports blood sugar this morning was 118.  Daughter does not realize ramipril and Marcelline Deist were d/c due to kidney failure.  Patient has not restarted.  Needs refills on cardiac meds before her move.      Current cardiac meds: apixaban 5 mg BID, atorvastatin 20mg , carvedilol 12.5mg  BID, furosemide 40mg  daily Previously tried: warfarin, farxiga 10mg , hydralazine 100mg , ramipril 5 mg  BP goal: <130/80  Wt Readings from Last 3 Encounters:  04/25/20 140 lb 9.6 oz (63.8 kg)  03/29/20 149 lb (67.6 kg)  03/25/20 153 lb 3.2 oz (69.5 kg)   BP Readings from Last 3 Encounters:  04/25/20 110/70  03/29/20 (!) 143/95  03/25/20 132/80   Pulse Readings from Last 3 Encounters:  04/25/20 70  03/29/20 81  03/25/20 86    Renal function: CrCl cannot be calculated (Patient's most recent lab result is older than the maximum 21 days allowed.).  Past Medical History:  Diagnosis Date  . CHF (congestive heart failure) (HCC)   . Diabetes mellitus without complication (HCC)   . Elevated lactic acid level   . Hypertension   . Hypothyroidism   . Paroxysmal atrial fibrillation (HCC)   . Seizures (HCC)      Current Outpatient Medications on File Prior to Visit  Medication Sig Dispense Refill  . apixaban (ELIQUIS) 5 MG TABS tablet Take 1 tablet (5 mg total) by mouth 2 (two) times daily. 60 tablet 0  . atorvastatin (LIPITOR) 20 MG tablet Take 1 tablet (20 mg total) by mouth daily. 30 tablet 0  . carvedilol (COREG) 12.5 MG tablet Take 1 tablet (12.5 mg total) by mouth in the morning and at bedtime. 60 tablet 1  . cetirizine (ZYRTEC) 10 MG tablet Take 1 tablet (10 mg total) by mouth daily. 30 tablet 11  . Cholecalciferol (VITAMIN D3) 1.25 MG (50000 UT) CAPS Take 50,000 Units by mouth every Friday.    . furosemide (LASIX) 40 MG tablet Take 1 tablet (40 mg total) by mouth as needed for fluid or edema (Use when swelling worsening or gain more than 5 lbs in 1 week.). 30 tablet 0  . insulin glargine (LANTUS SOLOSTAR) 100 UNIT/ML Solostar Pen Inject 15 Units into the skin at bedtime. 15 mL 11  . Levetiracetam 750 MG TB24 Take 1 tablet (750 mg total) by mouth 2 (two) times daily. 180 tablet 4  . levothyroxine (SYNTHROID) 125 MCG tablet Take 1 tablet (125 mcg total) by mouth every morning. 30 tablet 1  . metFORMIN (GLUCOPHAGE) 500 MG tablet  Take 1 tablet (500 mg total) by mouth 2 (two) times daily with a meal. 60 tablet 0  . mirabegron ER (MYRBETRIQ) 25 MG TB24 tablet Take 1 tablet (25 mg total) by mouth daily. 30 tablet 0   No current facility-administered medications on file prior to visit.    No Known Allergies   Assessment/Plan:  1. CHF - Advised patient of reasons for d/c of ramipril and Farxiga and that do not recommend restarting or switching to [facility] without frequent lab monitoring which will not be possible since patient is moving next week.  Encouraged patient to quickly find cardiologist and PCP in [place] and daughter said she was aware.  Will give 30 day refill for patient for meds until patient can be established.  Laural Golden, PharmD, BCACP, CDCES [facility]  [facility] [address] [place], [place] [address] Phone: [phone]; Fax: [phone] 05/16/2020 3:17 PM

## 2020-05-20 ENCOUNTER — Encounter: Payer: Self-pay | Admitting: Internal Medicine

## 2020-05-20 ENCOUNTER — Ambulatory Visit (INDEPENDENT_AMBULATORY_CARE_PROVIDER_SITE_OTHER): Payer: Medicare Other | Admitting: Internal Medicine

## 2020-05-20 VITALS — BP 150/100 | HR 64 | Ht 60.0 in | Wt 145.8 lb

## 2020-05-20 DIAGNOSIS — I10 Essential (primary) hypertension: Secondary | ICD-10-CM

## 2020-05-20 DIAGNOSIS — Z95810 Presence of automatic (implantable) cardiac defibrillator: Secondary | ICD-10-CM | POA: Diagnosis not present

## 2020-05-20 DIAGNOSIS — I5022 Chronic systolic (congestive) heart failure: Secondary | ICD-10-CM | POA: Diagnosis not present

## 2020-05-20 NOTE — Progress Notes (Signed)
Cardiology Office Note:    Date:  05/20/2020   ID:  Nichole [name], DOB 09-02-1955, MRN [id number]  PCP:  Jovita Kussmaul, MD  Cardiologist:  No primary care provider on file.  Electrophysiologist:  None   Referring MD: No ref. provider found   Chief Complaint/Reason for Referral: HFrEF  History of Present Illness:    Nichole [name] is a 64 y.o. female with a history of chronic systolic congestive heart failure, atrial fibrillation and sick sinus syndrome. She had a pacemaker placed in September 2011. After she had nonsustained ventricular tachycardia and syncope she had a subsequent upgrade to a biventricular ICD in March 2013. She also has coronary artery disease with a stent placed to the LAD. Her most recent cardiac catheterization was in 2013 and showed a widely patent proximal LAD stent, 50% left circumflex stenosis and a 20% mid RCA stenosis, ejection fraction 25%. She also has a history of essential hypertension, hypothyroidism, and chronic kidney disease. She underwent ICD gen change 10/2017 .    Her main concern today is pain at her pacemaker generator pocket site. I have examined this area, and have had Jenna Bullins RN examine as well. There is large keloid scar over the incision. There is no dehiscence, no drainage, no significant erythema, no induration, no swelling in pocket. There is very mild redness at the medial aspect of the incision, where she is most tender. She is exquisitely tender to palpation. She denies trauma or any recent issues with device generator site. Last known generator change was 10/2017. She has been taking Tylenol around the clock as well as ibuprofen. We discussed safe dosing of tylenol and ibuprofen.   Initially planned to leave for [place] in October.  Has had an interval hospitalization. She had significant acute renal failure, AMS and metabolic acidosis. Cr rose to 6.71. Her Ramipril and Marcelline Deist were stopped in the setting of this issue. Most recent echo  shows preserved EF.   Recommendations made to pursue palliative care who she has seen.  PAF, transitioned from warfarin to DOAC for less monitoring.    Past Medical History:  Diagnosis Date   CHF (congestive heart failure) (HCC)    Diabetes mellitus without complication (HCC)    Elevated lactic acid level    Hypertension    Hypothyroidism    Paroxysmal atrial fibrillation (HCC)    Seizures (HCC)     Past Surgical History:  Procedure Laterality Date   PACEMAKER IMPLANT      Current Medications: Current Meds  Medication Sig   apixaban (ELIQUIS) 5 MG TABS tablet Take 1 tablet (5 mg total) by mouth 2 (two) times daily.   atorvastatin (LIPITOR) 20 MG tablet Take 1 tablet (20 mg total) by mouth daily.   carvedilol (COREG) 12.5 MG tablet Take 1 tablet (12.5 mg total) by mouth in the morning and at bedtime.   cetirizine (ZYRTEC) 10 MG tablet Take 1 tablet (10 mg total) by mouth daily.   furosemide (LASIX) 40 MG tablet Take 1 tablet (40 mg total) by mouth as needed for fluid or edema (Use when swelling worsening or gain more than 5 lbs in 1 week.).   insulin glargine (LANTUS SOLOSTAR) 100 UNIT/ML Solostar Pen Inject 15 Units into the skin at bedtime.   Levetiracetam 750 MG TB24 Take 1 tablet (750 mg total) by mouth 2 (two) times daily.   levothyroxine (SYNTHROID) 125 MCG tablet Take 1 tablet (125 mcg total) by mouth every morning.   metFORMIN (GLUCOPHAGE) 500 MG  tablet Take 1 tablet (500 mg total) by mouth 2 (two) times daily with a meal.   mirabegron ER (MYRBETRIQ) 25 MG TB24 tablet Take 1 tablet (25 mg total) by mouth daily.     Allergies:   Patient has no known allergies.   Social History   Tobacco Use   Smoking status: Former Smoker    Packs/day: 0.25    Quit date: 02/06/1999    Years since quitting: 21.2   Smokeless tobacco: Never Used  Vaping Use   Vaping Use: Never used  Substance Use Topics   Alcohol use: Never   Drug use: Never     Family  History: The patient's family history is not on file.  ROS:   Please see the history of present illness.    All other systems reviewed and are negative.  EKGs/Labs/Other Studies Reviewed:    The following studies were reviewed today:  EKG:  AV pacemaker  Recent Labs: 03/14/2020: ALT 21 03/17/2020: TSH 2.366 03/19/2020: Magnesium 1.5 03/25/2020: BUN 10; Creatinine, Ser 0.76; Hemoglobin 11.4; Platelets 276; Potassium 4.3; Sodium 147  Recent Lipid Panel    Component Value Date/Time   CHOL 194 02/05/2020 0510   TRIG 135 02/05/2020 0510   HDL 47 02/05/2020 0510   CHOLHDL 4.1 02/05/2020 0510   VLDL 27 02/05/2020 0510   LDLCALC 120 (H) 02/05/2020 0510    Physical Exam:    VS:  BP (!) 150/100 (BP Location: Right Arm, Patient Position: Sitting)    Pulse 64    Ht 5' (1.524 m)    Wt 145 lb 12.8 oz (66.1 kg)    SpO2 90%    BMI 28.47 kg/m     Wt Readings from Last 5 Encounters:  05/20/20 145 lb 12.8 oz (66.1 kg)  05/16/20 147 lb 12.8 oz (67 kg)  04/25/20 140 lb 9.6 oz (63.8 kg)  03/29/20 149 lb (67.6 kg)  03/25/20 153 lb 3.2 oz (69.5 kg)    Constitutional: No acute distress Chest: ICD generator left chest wall. There is large keloid scar over the incision. There is no dehiscence, no drainage, no significant erythema, no induration, no swelling in pocket. There is very mild redness at the medial aspect of the incision, where she is most tender.  Cardiovascular: regular rhythm, normal rate, no murmurs. S1 and S2 normal.  GI : normal bowel sounds, soft and nontender. No distention.   MSK: extremities warm, well perfused. No edema.  NEURO: grossly nonfocal exam, moves all extremities. PSYCH: alert and oriented x 3, normal mood and affect.   ASSESSMENT:    1. Biventricular ICD (implantable cardioverter-defibrillator) in place   2. Chronic systolic CHF (congestive heart failure) (HCC)   3. Essential hypertension    PLAN:    I am unsure who is following her device, previously done at  [facility] in February 2021, and she had planned to move prior to this follow-up appointment today and establish care in [place].  Given issues with device incision pain, I would recommend she establish with EP prior to her move for device interrogation and examination of her device pocket.  I do not see a recent download on her device.  Ramipril and Marcelline Deist stopped in the setting of severe renal dysfunction.  She is tolerating other medical therapy well from a symptom standpoint.  She is on apixaban 5 mg twice daily.  Blood pressure is quite elevated, which they feel may be secondary to pain.  Would increase her carvedilol dosage for better  blood pressure control, we have discussed this and patient and family would prefer no significant medication changes prior to moving in 10 days. She plans to establish care in [place] quite quickly.   Total time of encounter: 30 minutes total time of encounter, including 25 minutes spent in face-to-face patient care on the date of this encounter. This time includes coordination of care and counseling regarding above mentioned problem list. Remainder of non-face-to-face time involved reviewing chart documents/testing relevant to the patient encounter and documentation in the medical record. I have independently reviewed documentation from referring provider.   Weston Brass, MD [place]   [facility]    Medication Adjustments/Labs and Tests Ordered: Current medicines are reviewed at length with the patient today.  Concerns regarding medicines are outlined above.   Orders Placed This Encounter  Procedures   EKG 12-Lead    No orders of the defined types were placed in this encounter.   Patient Instructions  Medication Instructions:  No Changes In Medications at this time.  *If you need a refill on your cardiac medications before your next appointment, please call your pharmacy*  Lab Work: None Ordered At This Time.  If you have labs  (blood work) drawn today and your tests are completely normal, you will receive your results only by:  MyChart Message (if you have MyChart) OR  A paper copy in the mail If you have any lab test that is abnormal or we need to change your treatment, we will call you to review the results.  Testing/Procedures: None Ordered At This Time.   Follow-Up: At [facility], you and your health needs are our priority.  As part of our continuing mission to provide you with exceptional heart care, we have created designated Provider Care Teams.  These Care Teams include your primary Cardiologist (physician) and Advanced Practice Providers (APPs -  Physician Assistants and Nurse Practitioners) who all work together to provide you with the care you need, when you need it.  Your next appointment:   MESSAGE SENT TO DEVICE CLINIC- SOME ONE WILL REACH OUT TO YOU   FOLLOW UP WITHIN 1 MONTH IN [place] WITH CARDIOLOGY

## 2020-05-20 NOTE — Patient Instructions (Signed)
Medication Instructions:  No Changes In Medications at this time.  *If you need a refill on your cardiac medications before your next appointment, please call your pharmacy*  Lab Work: None Ordered At This Time.  If you have labs (blood work) drawn today and your tests are completely normal, you will receive your results only by: Marland Kitchen MyChart Message (if you have MyChart) OR . A paper copy in the mail If you have any lab test that is abnormal or we need to change your treatment, we will call you to review the results.  Testing/Procedures: None Ordered At This Time.   Follow-Up: At [facility], you and your health needs are our priority.  As part of our continuing mission to provide you with exceptional heart care, we have created designated Provider Care Teams.  These Care Teams include your primary Cardiologist (physician) and Advanced Practice Providers (APPs -  Physician Assistants and Nurse Practitioners) who all work together to provide you with the care you need, when you need it.  Your next appointment:   MESSAGE SENT TO DEVICE CLINIC- SOME ONE WILL REACH OUT TO YOU   FOLLOW UP WITHIN 1 MONTH IN [place] WITH CARDIOLOGY

## 2020-05-23 NOTE — Addendum Note (Signed)
Addended by: Bea Laura B on: 05/23/2020 03:15 PM   Modules accepted: Orders

## 2020-05-24 ENCOUNTER — Encounter: Payer: Self-pay | Admitting: Cardiology

## 2020-05-24 ENCOUNTER — Other Ambulatory Visit: Payer: Self-pay

## 2020-05-24 ENCOUNTER — Ambulatory Visit (INDEPENDENT_AMBULATORY_CARE_PROVIDER_SITE_OTHER): Payer: Medicare Other | Admitting: Cardiology

## 2020-05-24 VITALS — BP 146/102 | HR 88 | Ht 60.0 in | Wt 148.0 lb

## 2020-05-24 DIAGNOSIS — I5022 Chronic systolic (congestive) heart failure: Secondary | ICD-10-CM

## 2020-05-24 DIAGNOSIS — Z95810 Presence of automatic (implantable) cardiac defibrillator: Secondary | ICD-10-CM | POA: Insufficient documentation

## 2020-05-24 NOTE — Patient Instructions (Signed)
Medication Instructions:  Your physician recommends that you continue on your current medications as directed. Please refer to the Current Medication list given to you today.  Labwork: None ordered.  Testing/Procedures: None ordered.  Follow-Up: Your physician wants you to follow-up in: as needed with Dr. Lambert.    Any Other Special Instructions Will Be Listed Below (If Applicable).  If you need a refill on your cardiac medications before your next appointment, please call your pharmacy.   

## 2020-05-24 NOTE — Progress Notes (Signed)
Electrophysiology Office Note:    Date:  05/24/2020   ID:  Nichole [name], DOB 30-Sep-1955, MRN [id number]  PCP:  Jovita Kussmaul, MD  [facility] Cardiologist:  No primary care provider on file.  [facility] Electrophysiologist:  None   Referring MD: Jovita Kussmaul, MD   Chief Complaint: Chronic systolic heart failure  History of Present Illness:    Nichole [name] is a 64 y.o. female who presents for an evaluation of chronic systolic heart failure at the request of Dr. Jacques Navy. Their medical history includes diabetes, hypertension, paroxysmal atrial fibrillation, seizures, coronary artery disease, chronic kidney disease.  She saw Dr. Jacques Navy on 05/20/2020 for chronic systolic heart failure.  At that visit, the patient reported severe pain in her upper chest and neck.  There was concern that potentially the defibrillator pocket was infected.  The patient is planning to relocate to [place] within the next 2 weeks and is establishing care at the [facility].  She is never had a shock from her device.  Patient is followed by primary care physician, Dr. Pecola Leisure.  Last appointment with her primary care physician was on 04/25/2020.  At that appointment she was reporting chest pain near the pacemaker pocket.  There is also mention in that clinic note that the patient was seeing hospice.  The referral to hospice seems to have been initiated after a hospitalization in September 2021 for acute renal failure.  Review of the discharge summary on 03/14/2020 shows that the patient indicated she wished to be made DNR.  She also wished not to be brought back to the hospital in the future.  She wanted to go to hospice and wanted a referral to palliative care despite no sign of life-threatening illness.  Past Medical History:  Diagnosis Date  . CHF (congestive heart failure) (HCC)   . Diabetes mellitus without complication (HCC)   . Elevated lactic acid level   . Hypertension   .  Hypothyroidism   . Paroxysmal atrial fibrillation (HCC)   . Seizures (HCC)     Past Surgical History:  Procedure Laterality Date  . PACEMAKER IMPLANT      Current Medications: Current Meds  Medication Sig  . apixaban (ELIQUIS) 5 MG TABS tablet Take 1 tablet (5 mg total) by mouth 2 (two) times daily.  Marland Kitchen atorvastatin (LIPITOR) 20 MG tablet Take 1 tablet (20 mg total) by mouth daily.  . carvedilol (COREG) 12.5 MG tablet Take 1 tablet (12.5 mg total) by mouth in the morning and at bedtime.  . cetirizine (ZYRTEC) 10 MG tablet Take 1 tablet (10 mg total) by mouth daily.  . furosemide (LASIX) 40 MG tablet Take 1 tablet (40 mg total) by mouth as needed for fluid or edema (Use when swelling worsening or gain more than 5 lbs in 1 week.).  Marland Kitchen insulin glargine (LANTUS SOLOSTAR) 100 UNIT/ML Solostar Pen Inject 15 Units into the skin at bedtime.  . Levetiracetam 750 MG TB24 Take 1 tablet (750 mg total) by mouth 2 (two) times daily.  Marland Kitchen levothyroxine (SYNTHROID) 125 MCG tablet Take 1 tablet (125 mcg total) by mouth every morning.  . metFORMIN (GLUCOPHAGE) 500 MG tablet Take 1 tablet (500 mg total) by mouth 2 (two) times daily with a meal.  . mirabegron ER (MYRBETRIQ) 25 MG TB24 tablet Take 1 tablet (25 mg total) by mouth daily.     Allergies:   Patient has no known allergies.   Social History   Socioeconomic History  . Marital status:  Widowed    Spouse name: Not on file  . Number of children: Not on file  . Years of education: Not on file  . Highest education level: Not on file  Occupational History  . Not on file  Tobacco Use  . Smoking status: Former Smoker    Packs/day: 0.25    Quit date: 02/06/1999    Years since quitting: 21.3  . Smokeless tobacco: Never Used  Vaping Use  . Vaping Use: Never used  Substance and Sexual Activity  . Alcohol use: Never  . Drug use: Never  . Sexual activity: Not on file  Other Topics Concern  . Not on file  Social History Narrative  . Not on file    Social Determinants of Health   Financial Resource Strain:   . Difficulty of Paying Living Expenses: Not on file  Food Insecurity:   . Worried About Programme researcher, broadcasting/film/video in the Last Year: Not on file  . Ran Out of Food in the Last Year: Not on file  Transportation Needs:   . Lack of Transportation (Medical): Not on file  . Lack of Transportation (Non-Medical): Not on file  Physical Activity:   . Days of Exercise per Week: Not on file  . Minutes of Exercise per Session: Not on file  Stress:   . Feeling of Stress : Not on file  Social Connections:   . Frequency of Communication with Friends and Family: Not on file  . Frequency of Social Gatherings with Friends and Family: Not on file  . Attends Religious Services: Not on file  . Active Member of Clubs or Organizations: Not on file  . Attends Banker Meetings: Not on file  . Marital Status: Not on file     Family History: The patient's family history is not on file.  ROS:   Please see the history of present illness.    All other systems reviewed and are negative.  EKGs/Labs/Other Studies Reviewed:    The following studies were reviewed today: Prior notes, echo  03/15/2020 echo personally reviewed Left ventricular function normal, 55% Right ventricular function normal No significant valvular abnormalities  In clinic device interrogation personally reviewed Longevity 4.4 years Atrial lead impedance 399 ohms, capture threshold 0.75 at 0.4 P wave 2.0 mV Right ventricular lead impedance 361 ohms, capture threshold 1.0 at 0.4, R wave 2.9 mV Left ventricular lead impedance 608 ohms, capture threshold 0.750.4 Ventricular arrhythmias detected Patient activity less than 1 hour/day for at least 15 weeks Effective BiV pacing estimated at 98.4% 0% atrial fibrillation VT zone 1 76-207 with 3 burst followed by 15, 25, 35 x 3 J shocks VF zone greater than 207 bpm with ATP during charging followed by 25, 35x5 shocks                                  Recent Labs: 03/14/2020: ALT 21 03/17/2020: TSH 2.366 03/19/2020: Magnesium 1.5 03/25/2020: BUN 10; Creatinine, Ser 0.76; Hemoglobin 11.4; Platelets 276; Potassium 4.3; Sodium 147  Recent Lipid Panel    Component Value Date/Time   CHOL 194 02/05/2020 0510   TRIG 135 02/05/2020 0510   HDL 47 02/05/2020 0510   CHOLHDL 4.1 02/05/2020 0510   VLDL 27 02/05/2020 0510   LDLCALC 120 (H) 02/05/2020 0510    Physical Exam:    VS:  BP (!) 146/102   Pulse 88   Ht 5' (1.524 m)  Wt 148 lb (67.1 kg)   SpO2 95%   BMI 28.90 kg/m     Wt Readings from Last 3 Encounters:  05/24/20 148 lb (67.1 kg)  05/20/20 145 lb 12.8 oz (66.1 kg)  05/16/20 147 lb 12.8 oz (67 kg)     GEN: Chronically ill-appearing.  Quiet.  Clenched fists HEENT: Normal NECK: No JVD; No carotid bruits LYMPHATICS: No lymphadenopathy CARDIAC: RRR, large keloid on the medial aspect of the pacemaker pocket incision that is tender to touch.  Very minor erythema on the medial border of this keloid.  Pocket is not fluctuant.  Incision and surrounding area is tender to touch with pain also present in bilateral shoulders and up the neck.  No drainage. RESPIRATORY:  Clear to auscultation without rales, wheezing or rhonchi  ABDOMEN: Soft, non-tender, non-distended MUSCULOSKELETAL:  No edema; No deformity  SKIN: Warm and dry NEUROLOGIC:  Alert and oriented x 3 PSYCHIATRIC: Flat affect   ASSESSMENT:    1. Chronic systolic CHF (congestive heart failure) (HCC)   2. ICD (implantable cardioverter-defibrillator) in place    PLAN:    In order of problems listed above:  1. Chronic systolic heart failure with CRT-D Assessment today very difficult given the patient is a poor historian.  She appears to be relatively euvolemic on my exam today.  NYHA II-III symptoms.  OptiVol index on her device has wild fluctuations so unclear whether not this is a reliable indicator of her clinical status.  Recommend she continue  her carvedilol, Lasix.  She is not able to take ACE or ARB due to acute renal failure during her recent admission.  Her CRT-D is well-functioning.  There is pain on the medial aspect of her CRT-D incision.  There is very minimal erythema in this area.  Although difficult to tease out during my interview it seems that this is a new finding for her in the last few months.  It is possible that this represents a device related infection.  There is no signs of systemic illness today.  She is moving in the next few days to [place].  I have encouraged her to to seek immediate care with cardiology and electrophysiology when she arrives.  I would recommend to the team in [place] to investigate the pain in the CRT-D pocket further.  Consider a tagged white blood cell scan to assess for infection in this area.  The discomfort could also be due to the large keloid.  Recommend that when she is established with a new cardiologist and primary care physician in [place] the goals of care to be revisited with the patient and her family.  Her device therapies remain active.   Medication Adjustments/Labs and Tests Ordered: Current medicines are reviewed at length with the patient today.  Concerns regarding medicines are outlined above.  No orders of the defined types were placed in this encounter.  No orders of the defined types were placed in this encounter.    Signed, Steffanie Dunn, MD, [facility]  05/24/2020 6:03 PM    Electrophysiology [facility]

## 2020-05-27 ENCOUNTER — Other Ambulatory Visit: Payer: Self-pay | Admitting: Family Medicine

## 2020-05-27 DIAGNOSIS — N3281 Overactive bladder: Secondary | ICD-10-CM

## 2020-05-27 DIAGNOSIS — E038 Other specified hypothyroidism: Secondary | ICD-10-CM

## 2020-05-28 ENCOUNTER — Other Ambulatory Visit: Payer: Self-pay

## 2020-05-28 MED ORDER — FUROSEMIDE 40 MG PO TABS: 40.0000 mg | ORAL_TABLET | ORAL | 0 refills | Status: AC | PRN

## 2020-05-30 ENCOUNTER — Other Ambulatory Visit: Payer: Self-pay | Admitting: Family Medicine

## 2020-05-30 DIAGNOSIS — I2510 Atherosclerotic heart disease of native coronary artery without angina pectoris: Secondary | ICD-10-CM

## 2020-05-31 ENCOUNTER — Encounter: Payer: Self-pay | Admitting: Student in an Organized Health Care Education/Training Program

## 2020-05-31 ENCOUNTER — Ambulatory Visit (INDEPENDENT_AMBULATORY_CARE_PROVIDER_SITE_OTHER): Payer: Medicare Other | Admitting: Student in an Organized Health Care Education/Training Program

## 2020-05-31 ENCOUNTER — Other Ambulatory Visit: Payer: Self-pay

## 2020-05-31 DIAGNOSIS — R221 Localized swelling, mass and lump, neck: Secondary | ICD-10-CM

## 2020-05-31 NOTE — Patient Instructions (Signed)
It was a pleasure to see you today!  To summarize our discussion for this visit:  It was nice to meet you and I'm sad to see you going so soon. I hope you have a wonderful life in [place].   Your heart and lungs on my and the cardiology exam appear to be stable. With the continued pain and swelling in the shoulder area, I think it would be reasonable to start with a chest/shoulder xray and then possibly ultrasound to evaluate the swelling. Those can be done non-emergently but please establish with your new doctor soon to be evaluated as soon as possible.   Your kidneys and liver function are normal so you can continue to take tylenol for your pain. (up to 3g per day). I would not take ibuprofen type medicines regularly for their long-term effects on your GI system/kidneys/heart/etc.  Some additional health maintenance measures we should update are: Health Maintenance Due  Topic Date Due  . Hepatitis C Screening  Never done  . FOOT EXAM  Never done  . OPHTHALMOLOGY EXAM  Never done  . URINE MICROALBUMIN  Never done  . COVID-19 Vaccine (1) Never done  . TETANUS/TDAP  Never done  . PAP SMEAR-Modifier  Never done  . MAMMOGRAM  Never done  . COLONOSCOPY  Never done  . INFLUENZA VACCINE  Never done  .    Call the clinic at [phone] if your symptoms worsen or you have any concerns.   Thank you for allowing me to take part in your care,  Dr. Jamelle Rushing

## 2020-05-31 NOTE — Progress Notes (Addendum)
° °  SUBJECTIVE:   CHIEF COMPLAINT / HPI: f/u cough  Cough- slowed down a lot. Just some mild throat tickle remaining. No phlegm. No fever or other illness.   Urinary incontinence- no accidents. Medication is affordable.   Non-cardiac chest pain- associated with apparent intermittent swelling in bilateral supraclavicular/shoulder areas. No known trigger. Currently taking Ibuprofen 200, or tylenol unknown dose twice per day.  The tylenol does help the discomfort. Hasn't tried anything else. No respiratory symptoms associated. Seen by cardiology for thorough workup which was unrevealing and reassuring for non-cardiac source.  OBJECTIVE:   BP 138/80    Pulse 95    Ht 5' (1.524 m)    Wt 147 lb 6.4 oz (66.9 kg)    SpO2 94%    BMI 28.79 kg/m   General: NAD, pleasant, able to participate in exam Cardiac: RRR, normal heart sounds, no murmurs. 2+ radial and PT pulses bilaterally Respiratory: CTAB, normal effort, No wheezes, rales or rhonchi Neck: negative cervical lymphadenopathy. Positive for soft tissue swelling symmetrical supraclavicular areas which is mildly tender to palpation. Negative crepitus. Shoulder ROM intact. Skin: warm and dry, no rashes noted Neuro: alert and oriented Psych: Normal affect and mood  ASSESSMENT/PLAN:   Neck swelling Intermittent and unknown cause.  Differential includes inflammatory vs neoplastic causes. Given the intermittent nature, more likely inflammatory such as lymphadenopathy. Unlikely cardiac or pulmonary in nature given exam, no respiratory symptoms associated with swelling and recent normal thorough cardiac studies.  No imaging done in the past Tylenol resolves the discomfort Recommended imaging starting with xray but patient is moving to [facility] tomorrow Likely non-emergent but encouraged patient to follow up asap Weight has been fluctuant/stable over the last year between 147-160 Daughter and patient agreed to establish care shortly to pursue a  cause     Leeroy Bock, DO [facility]

## 2020-06-02 DIAGNOSIS — R221 Localized swelling, mass and lump, neck: Secondary | ICD-10-CM | POA: Insufficient documentation

## 2020-06-02 NOTE — Assessment & Plan Note (Addendum)
Intermittent and unknown cause.  Differential includes inflammatory vs neoplastic causes. Given the intermittent nature, more likely inflammatory such as lymphadenopathy. Unlikely cardiac or pulmonary in nature given exam, no respiratory symptoms associated with swelling and recent normal thorough cardiac studies.  No imaging done in the past Tylenol resolves the discomfort Recommended imaging starting with xray but patient is moving to [facility] tomorrow Likely non-emergent but encouraged patient to follow up asap Weight has been fluctuant/stable over the last year between 147-160 Daughter and patient agreed to establish care shortly to pursue a cause

## 2020-06-17 ENCOUNTER — Ambulatory Visit: Payer: Medicare Other | Admitting: Internal Medicine

## 2020-07-17 ENCOUNTER — Ambulatory Visit: Payer: Medicare Other | Admitting: Podiatry

## 2021-04-08 ENCOUNTER — Telehealth: Payer: Medicare Other | Admitting: Family Medicine

## 2022-03-16 IMAGING — CT CT HEAD W/O CM
4 series · 15 of 47 positions shown, 17 images · non-contrast
Comparison: None.

CLINICAL DATA: Seizure.

EXAM:
CT HEAD WITHOUT CONTRAST
TECHNIQUE: Contiguous axial images were obtained from the base of the skull
through the vertex without intravenous contrast.

[Series 3: head without · axial · non-contrast · 0.43mm/px · z∈[+1229,+1349]mm · 7 of 34 slices shown, 9 images]
[im 5/34  brain]
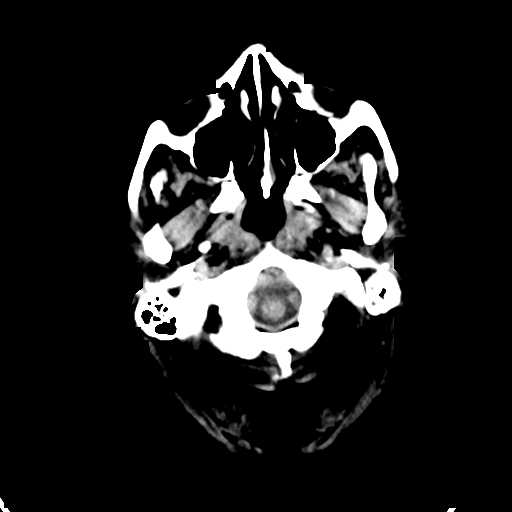
[im 5/34  bone]
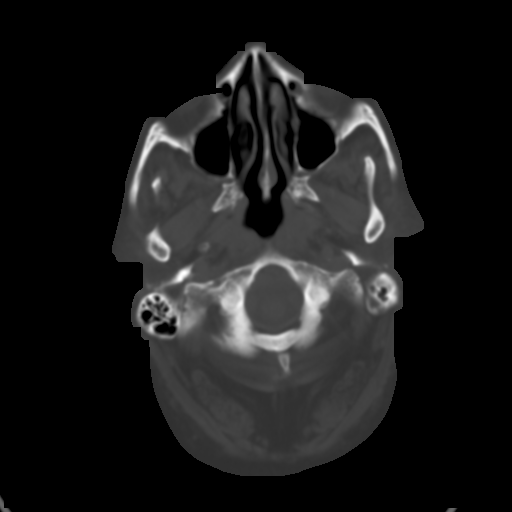
[im 9/34  brain]
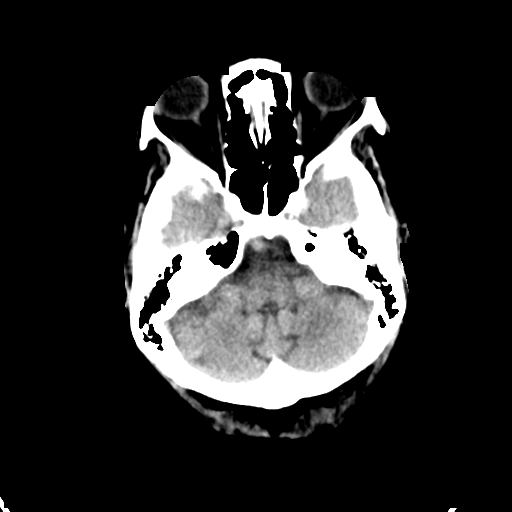
[im 13/34  brain]
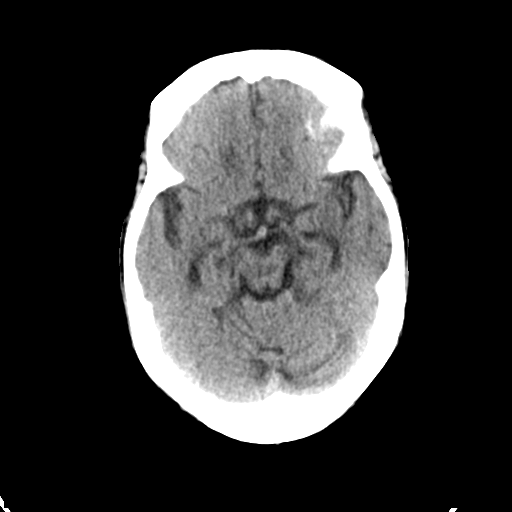
[im 17/34  brain]
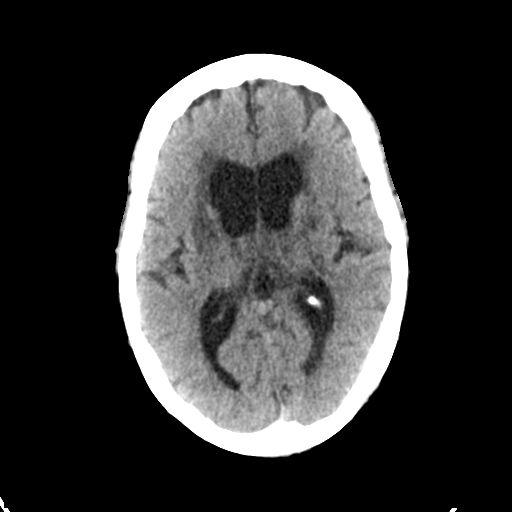
[im 21/34  brain]
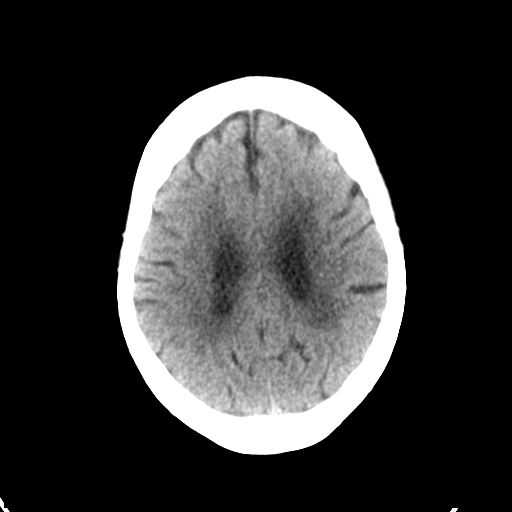
[im 21/34  bone]
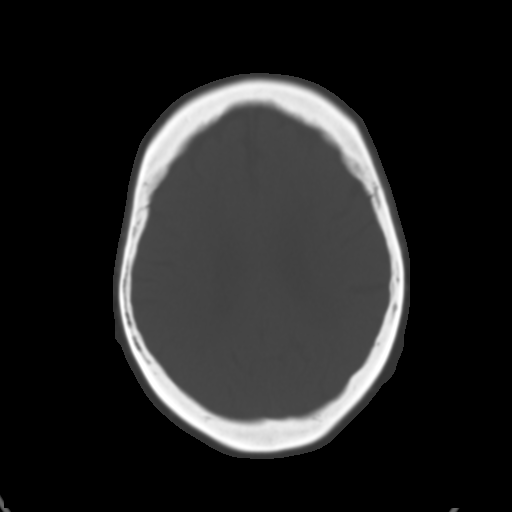
[im 25/34  brain]
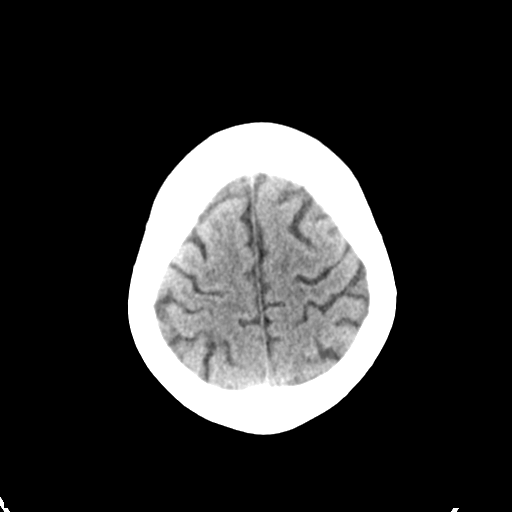
[im 29/34  brain]
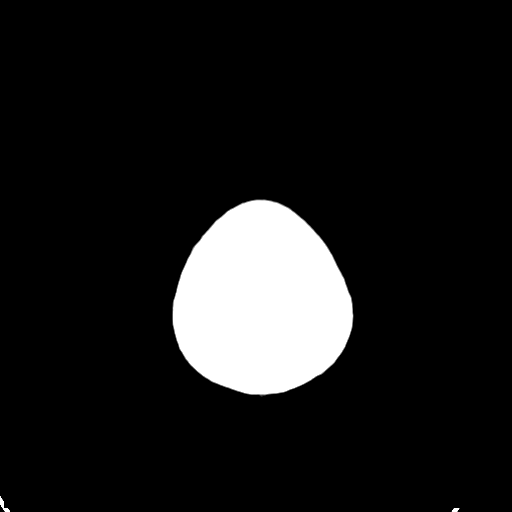

[Series 4: head bone · axial · 0.43mm/px · z∈[+1225,+1241]mm · 2 of 84 slices shown]
[im 9/84  bone]
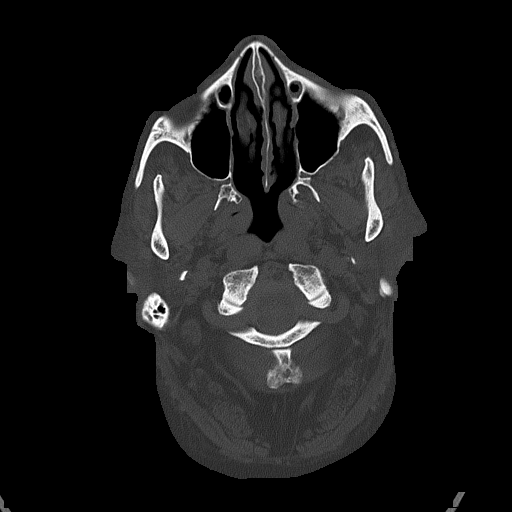
[im 17/84  bone]
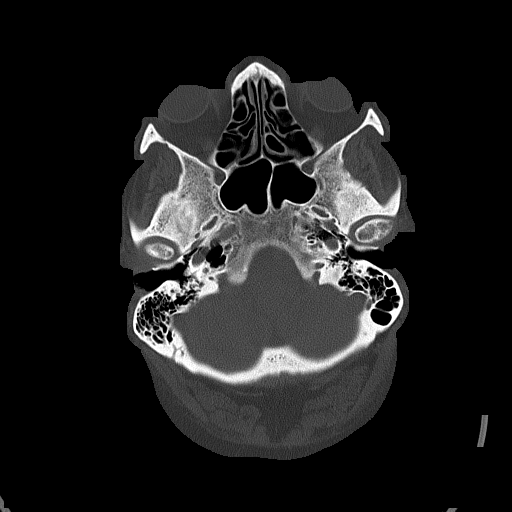

[Series 5: head without cor · coronal · non-contrast · 0.33mm/px · 3 of 67 slices shown]
[im 23/67  brain]
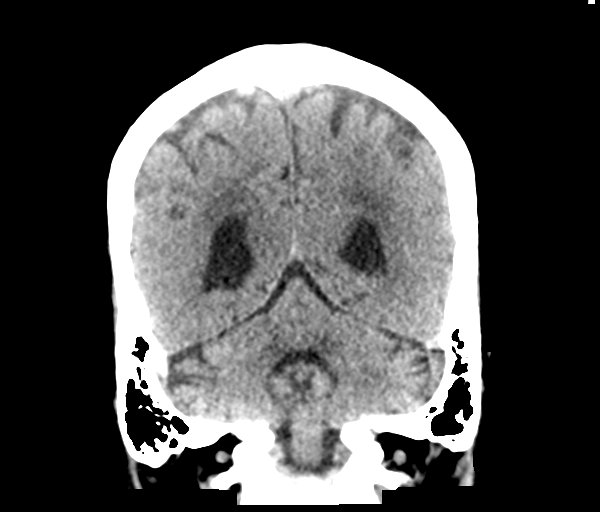
[im 30/67  brain]
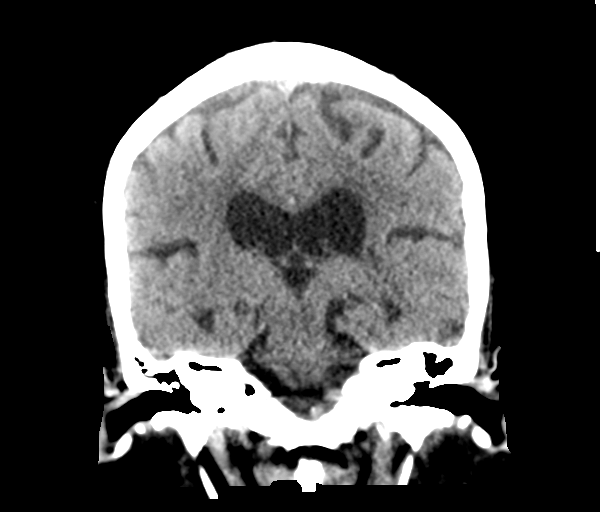
[im 37/67  brain]
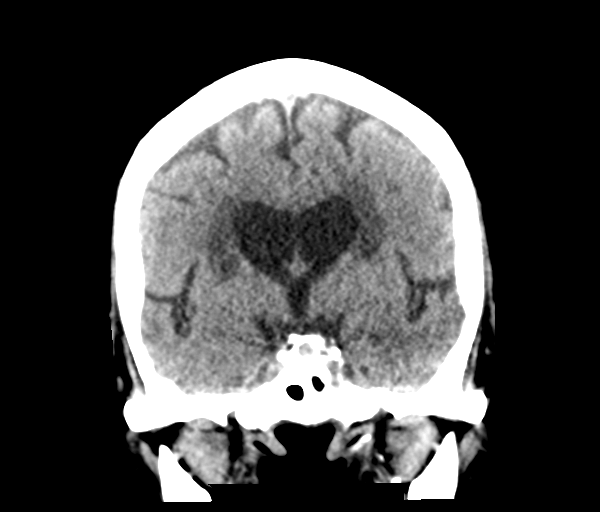

[Series 6: head without sag · sagittal · non-contrast · 0.33mm/px · 3 of 64 slices shown]
[im 22/64  brain]
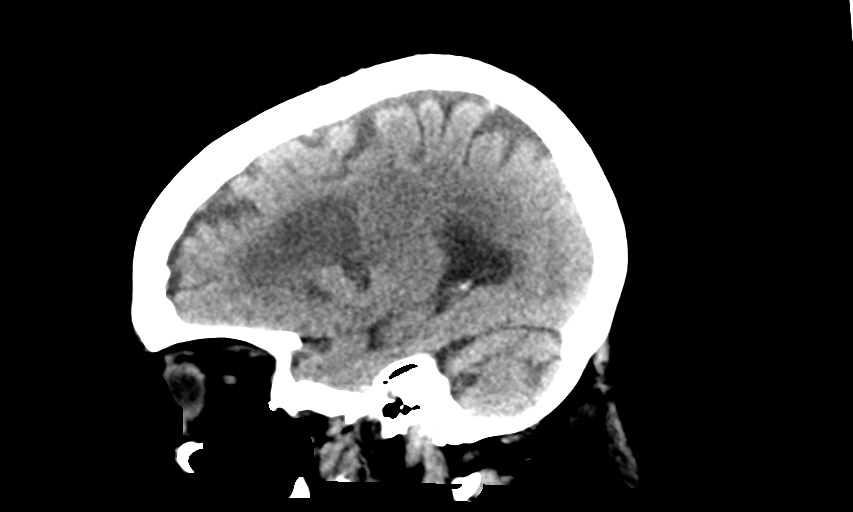
[im 32/64  brain]
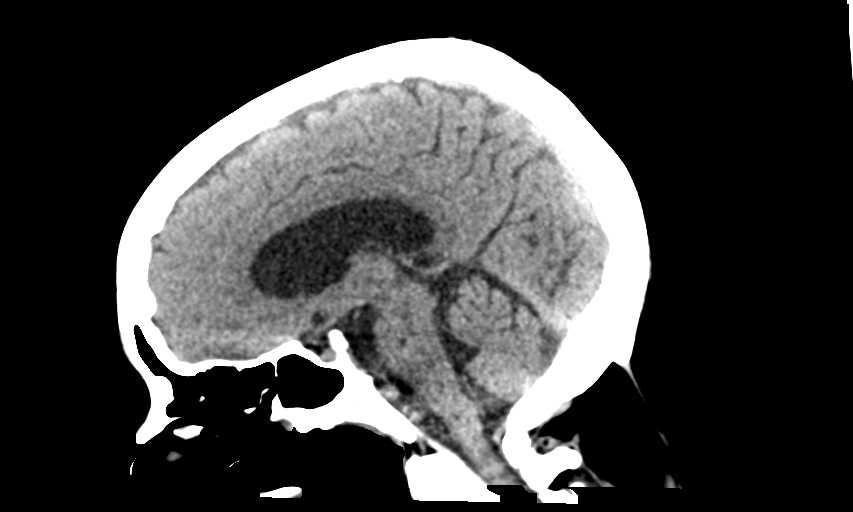
[im 43/64  brain]
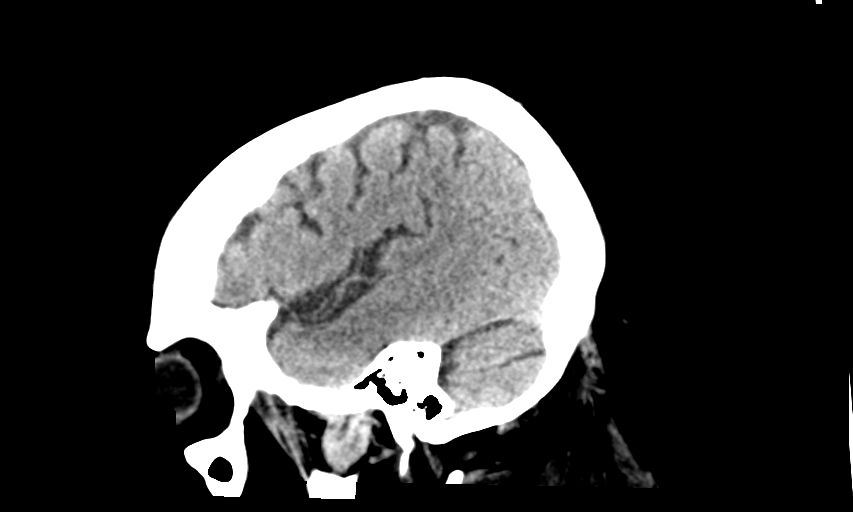

[15 of 47 positions shown; findings below may reference images not displayed]

FINDINGS: Brain: No evidence of acute infarction, hemorrhage, hydrocephalus,
extra-axial collection or mass lesion/mass effect. Advanced atrophy
and microvascular ischemic changes are noted. Multiple lacunar
infarcts are noted involving the bilateral basal ganglia, presumably
chronic. There is an old infarct involving the posteroinferior right
cerebellum (sagittal series 6, image 25).

Vascular: No hyperdense vessel or unexpected calcification.

Skull: Normal. Negative for fracture or focal lesion.

Sinuses/Orbits: No acute finding.

Other: None.
IMPRESSION: 1. No acute intracranial abnormality.
2. Advanced atrophy and microvascular ischemic changes.
3. Multiple lacunar infarcts are noted involving the bilateral basal
ganglia, presumably chronic. There is an old infarct involving the
posteroinferior right cerebellum.

## 2022-03-16 IMAGING — CR DG CHEST 2V
2 series · 2 of 2 positions shown · non-contrast
Comparison: None.

CLINICAL DATA: Seizures

EXAM:
CHEST - 2 VIEW

[chest lat]
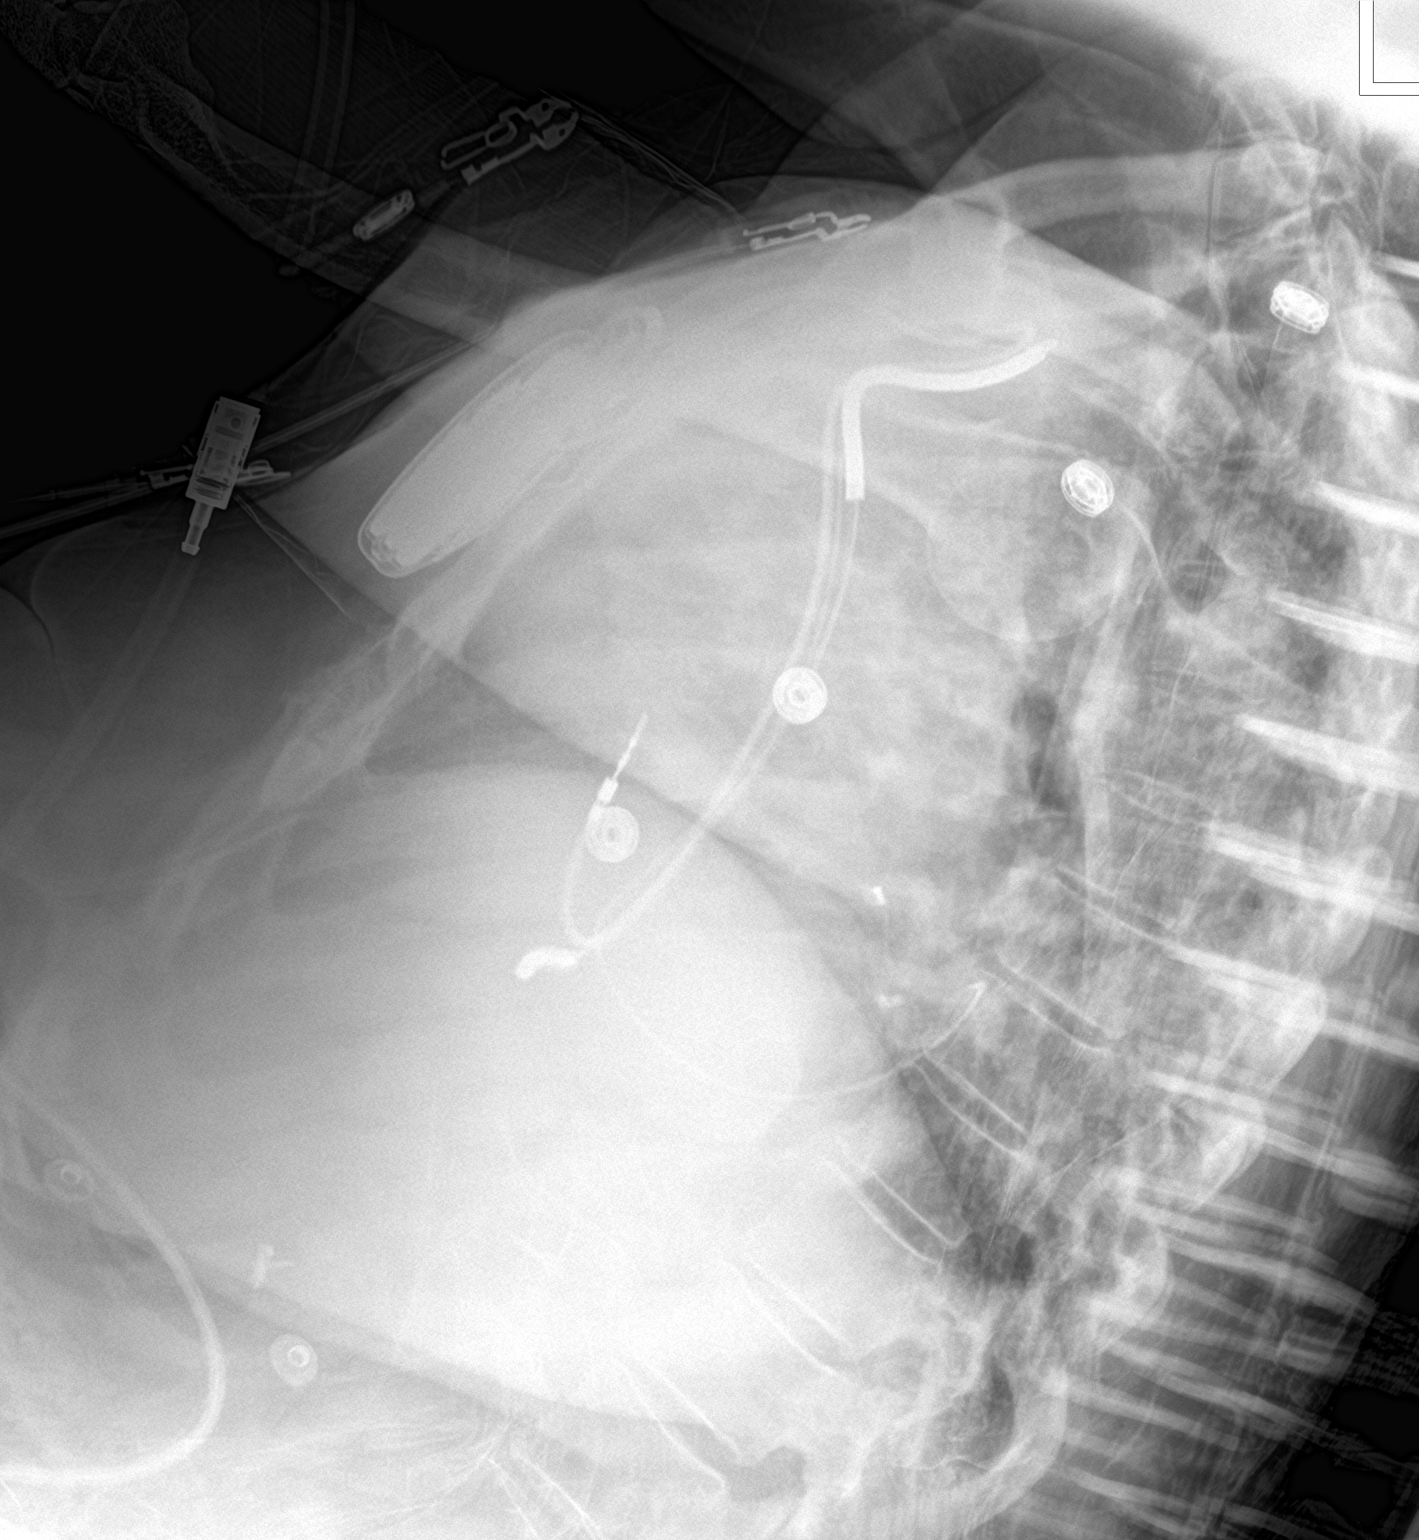

[chest ap]
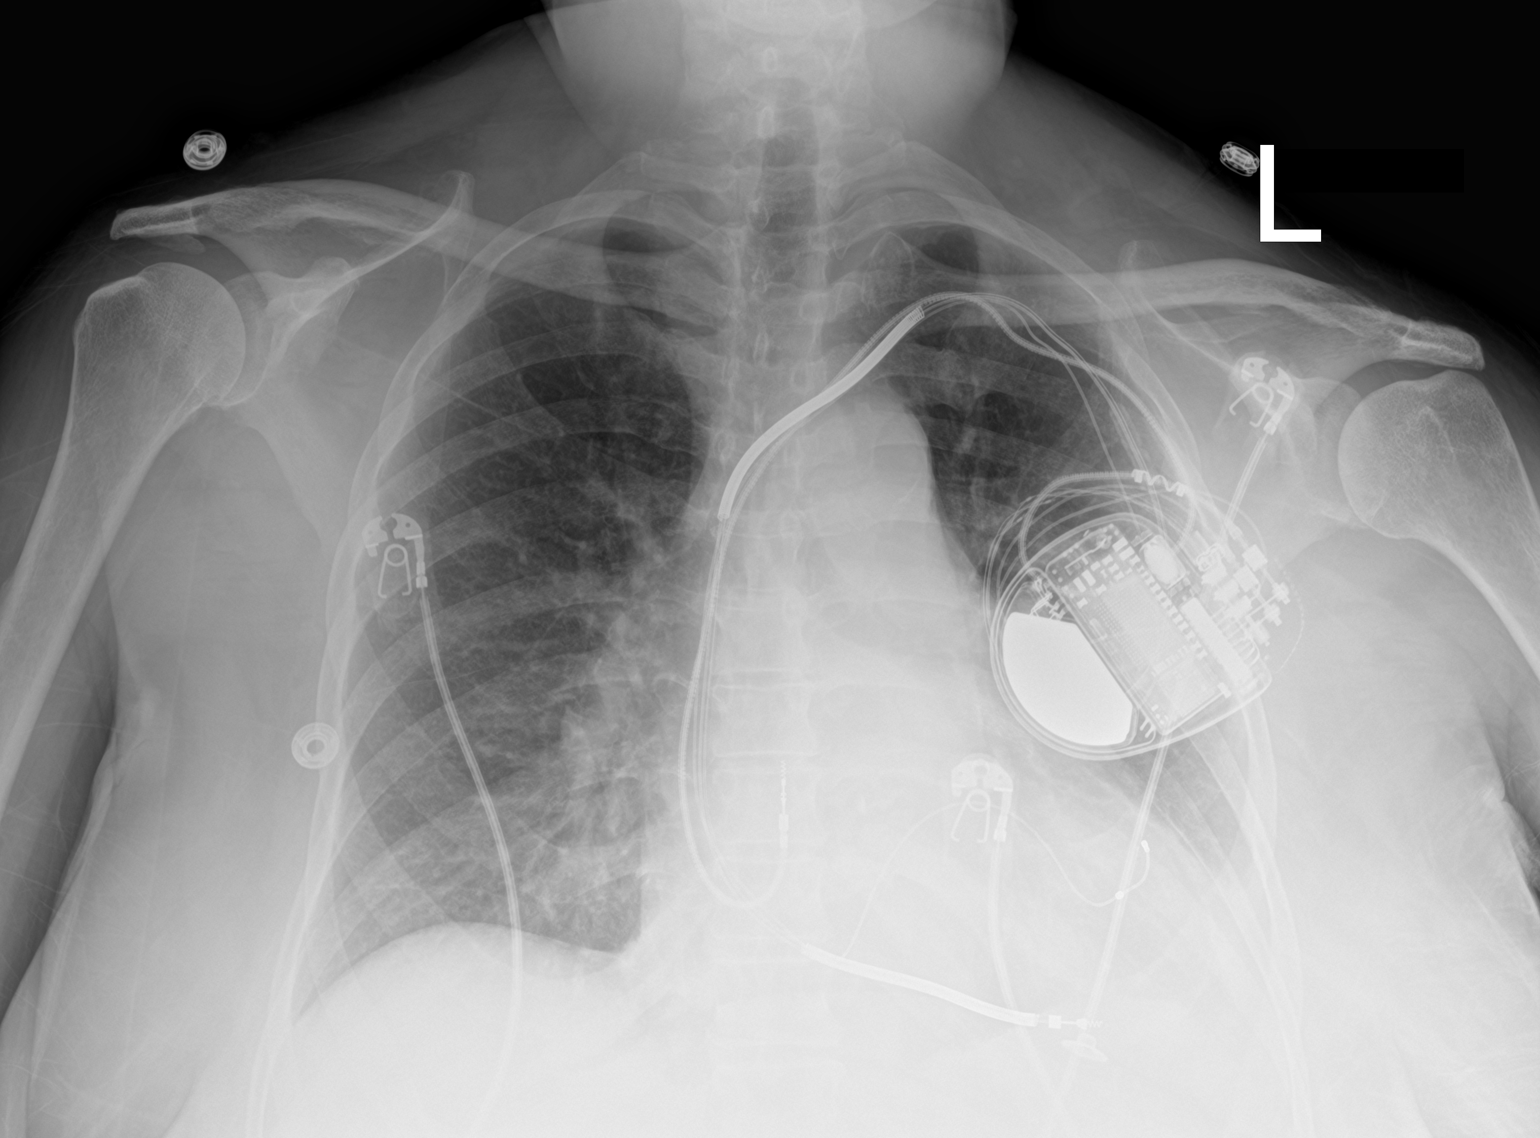

[2 of 2 positions shown; findings below may reference images not displayed]

FINDINGS: The lungs are clear. Opacification of the left lung bases related to
overlying soft tissue. No pneumothorax or pleural effusion. Cardiac
size is mildly enlarged. Left subclavian 3 lead pacemaker
defibrillator is in place. Pulmonary vascularity is normal. No acute
bone abnormality.
IMPRESSION: No active cardiopulmonary disease.  Mild cardiomegaly.
